# Patient Record
Sex: Female | Born: 1955
Health system: Southern US, Community
[De-identification: ages and names within clinical notes are randomized; demographics above are authoritative.]

## PROBLEM LIST (undated history)

## (undated) DIAGNOSIS — H04123 Dry eye syndrome of bilateral lacrimal glands: Secondary | ICD-10-CM

## (undated) DIAGNOSIS — F329 Major depressive disorder, single episode, unspecified: Secondary | ICD-10-CM

## (undated) DIAGNOSIS — F32A Depression, unspecified: Secondary | ICD-10-CM

## (undated) HISTORY — PX: LASIK: SHX215

## (undated) HISTORY — PX: FRACTURE SURGERY: SHX138

## (undated) HISTORY — PX: COLONOSCOPY: SHX174

## (undated) HISTORY — DX: Major depressive disorder, single episode, unspecified: F32.9

## (undated) HISTORY — DX: Dry eye syndrome of bilateral lacrimal glands: H04.123

## (undated) HISTORY — PX: EYE SURGERY: SHX253

## (undated) HISTORY — DX: Depression, unspecified: F32.A

---

## 2000-02-24 ENCOUNTER — Ambulatory Visit (HOSPITAL_COMMUNITY): Admission: AD | Admit: 2000-02-24 | Discharge: 2000-02-24 | Payer: Self-pay | Admitting: *Deleted

## 2000-02-24 ENCOUNTER — Encounter: Payer: Self-pay | Admitting: *Deleted

## 2000-04-02 HISTORY — PX: CLOSED REDUCTION HAND FRACTURE: SHX973

## 2005-08-30 ENCOUNTER — Encounter: Payer: Self-pay | Admitting: Family Medicine

## 2005-11-14 ENCOUNTER — Encounter: Payer: Self-pay | Admitting: Family Medicine

## 2009-02-08 ENCOUNTER — Ambulatory Visit: Payer: Self-pay | Admitting: Family Medicine

## 2009-02-08 DIAGNOSIS — R51 Headache: Secondary | ICD-10-CM | POA: Insufficient documentation

## 2009-02-08 DIAGNOSIS — R519 Headache, unspecified: Secondary | ICD-10-CM | POA: Insufficient documentation

## 2009-02-08 DIAGNOSIS — R635 Abnormal weight gain: Secondary | ICD-10-CM | POA: Insufficient documentation

## 2009-02-09 ENCOUNTER — Encounter: Payer: Self-pay | Admitting: Family Medicine

## 2009-02-09 LAB — CONVERTED CEMR LAB
ALT: 20 units/L (ref 0–35)
AST: 26 units/L (ref 0–37)
Albumin: 3.8 g/dL (ref 3.5–5.2)
Alkaline Phosphatase: 55 units/L (ref 39–117)
BUN: 11 mg/dL (ref 6–23)
Bilirubin, Direct: 0.1 mg/dL (ref 0.0–0.3)
CO2: 28 meq/L (ref 19–32)
Calcium: 9.3 mg/dL (ref 8.4–10.5)
Chloride: 106 meq/L (ref 96–112)
Cholesterol: 218 mg/dL — ABNORMAL HIGH (ref 0–200)
Creatinine, Ser: 0.8 mg/dL (ref 0.4–1.2)
Direct LDL: 134.4 mg/dL
GFR calc non Af Amer: 80.26 mL/min (ref >60)
Glucose, Bld: 84 mg/dL (ref 70–99)
HDL: 64.6 mg/dL (ref >39.00)
Potassium: 4.4 meq/L (ref 3.5–5.1)
Sodium: 142 meq/L (ref 135–145)
TSH: 2.39 microintl units/mL (ref 0.35–5.50)
Total Bilirubin: 1.1 mg/dL (ref 0.3–1.2)
Total CHOL/HDL Ratio: 3
Total Protein: 7.1 g/dL (ref 6.0–8.3)
Triglycerides: 90 mg/dL (ref 0.0–149.0)
VLDL: 18 mg/dL (ref 0.0–40.0)

## 2009-02-11 ENCOUNTER — Other Ambulatory Visit: Admission: RE | Admit: 2009-02-11 | Discharge: 2009-02-11 | Payer: Self-pay | Admitting: Family Medicine

## 2009-02-11 ENCOUNTER — Ambulatory Visit: Payer: Self-pay | Admitting: Family Medicine

## 2009-02-11 ENCOUNTER — Encounter: Payer: Self-pay | Admitting: Family Medicine

## 2009-02-11 DIAGNOSIS — K921 Melena: Secondary | ICD-10-CM | POA: Insufficient documentation

## 2009-02-16 ENCOUNTER — Encounter (INDEPENDENT_AMBULATORY_CARE_PROVIDER_SITE_OTHER): Payer: Self-pay | Admitting: *Deleted

## 2009-05-20 ENCOUNTER — Telehealth: Payer: Self-pay | Admitting: Family Medicine

## 2009-05-26 ENCOUNTER — Ambulatory Visit: Payer: Self-pay | Admitting: Family Medicine

## 2009-05-26 DIAGNOSIS — J018 Other acute sinusitis: Secondary | ICD-10-CM | POA: Insufficient documentation

## 2009-10-20 ENCOUNTER — Ambulatory Visit: Payer: Self-pay | Admitting: Family Medicine

## 2009-10-20 DIAGNOSIS — F430 Acute stress reaction: Secondary | ICD-10-CM | POA: Insufficient documentation

## 2009-11-15 ENCOUNTER — Encounter: Payer: Self-pay | Admitting: Family Medicine

## 2010-01-12 ENCOUNTER — Telehealth: Payer: Self-pay | Admitting: Family Medicine

## 2010-01-12 ENCOUNTER — Ambulatory Visit: Payer: Self-pay | Admitting: Family Medicine

## 2010-02-01 ENCOUNTER — Telehealth: Payer: Self-pay | Admitting: Family Medicine

## 2010-04-14 ENCOUNTER — Telehealth: Payer: Self-pay | Admitting: Family Medicine

## 2010-05-02 NOTE — Assessment & Plan Note (Signed)
Summary: DISCUSS MEDS FOR DEPRESSION   Vital Signs:  Patient profile:   55 year old female Weight:      177.38 pounds Temp:     98.2 degrees F oral Pulse rate:   60 / minute Pulse rhythm:   regular BP sitting:   110 / 80  (right arm) Cuff size:   regular  Vitals Entered By: Janee Morn CMA (October 20, 2009 8:56 AM) CC: Discuss meds   History of Present Illness: 55 yo here to discuss depression.  Never had a h/o depression in past but has multiple events in her life recently. Lost job last year. Father died in Sep 03, 2009 after prolonged illness. She was in a horse riding accident with her horse in 08/2009 and he had to be put to sleep.  It was very traumatic and the horse was bloodied and in significant pain. They had to take the horse to the dump because they were not at home when it happened.  She continues to replay this even in her mind.  Not having difficulty falling asleep. Appetite has increased and has significiant anhedonia. No SI or HI. Does have crying spells.  Seeing a therapist in [place] who works with PTSD.  Current Medications (verified): 1)  Treximet 85-500 Mg  Tabs (Sumatriptan-Naproxen Sodium) .[name].. 1 Once Daily As Needed Migraine 2)  Veramyst 27.5 Mcg/spray Susp (Fluticasone Furoate) .... As Needed 3)  Anusol-Hc 25 Mg Supp (Hydrocortisone Acetate) .... Apply To Rectum As Needed 4)  Budeprion Xl 150 Mg  Tb24 (Bupropion Hcl) .[name].. 1 By Mouth Every Morning.  Allergies (verified): No Known Drug Allergies  Review of Systems      See HPI Psych:  Complains of depression and easily tearful; denies anxiety, mental problems, panic attacks, sense of great danger, suicidal thoughts/plans, thoughts of violence, unusual visions or sounds, and thoughts /plans of harming others.  Physical Exam  General:  Well-developed,well-nourished,in no acute distress; alert,appropriate and cooperative throughout examination  Psych:  normally interactive, good eye  contact,tearful.   Impression & Recommendations:  Problem # 1:  STRESS REACTION, ACUTE (ICD-308.9) Assessment New Time spent with patient 25 minutes, more than 50% of this time was spent counseling patient on depression. Advised continuing therapy, will add wellbutrin. Pt to follow up in one month.  Complete Medication List: 1)  Treximet 85-500 Mg Tabs (Sumatriptan-naproxen sodium) .[name].. 1 once daily as needed migraine 2)  Veramyst 27.5 Mcg/spray Susp (Fluticasone furoate) .... As needed 3)  Anusol-hc 25 Mg Supp (Hydrocortisone acetate) .... Apply to rectum as needed 4)  Budeprion Xl 150 Mg Tb24 (Bupropion hcl) .[name].. 1 by mouth every morning.  Patient Instructions: 1)  Please call me in 2-4 weeks to let me know how you are doing. Prescriptions: BUDEPRION XL 150 MG  TB24 (BUPROPION HCL) 1 by mouth every morning.  #90 x 0   Entered and Authorized by:   Ruthe Mannan MD   Signed by:   Ruthe Mannan MD on 10/20/2009   Method used:   Electronically to        [facility] [address] (retail)       [address]       [place], [place]  [address]       Ph: [phone]       Fax: [phone]   RxID:   [id number]   Current Allergies (reviewed today): No known allergies

## 2010-05-02 NOTE — Assessment & Plan Note (Signed)
Summary: FLU SHOT/CLE  Nurse Visit   Allergies: No Known Drug Allergies  Immunizations Administered:  Influenza Vaccine # 1:    Vaccine Type: Fluvax 3+    Site: left deltoid    Mfr: GlaxoSmithKline    Dose: 0.5 ml    Route: IM    Given by: Mervin Hack CMA (AAMA)    Exp. Date: 09/30/2010    Lot #: [id number]    VIS given: 10/25/09 version given January 12, 2010.  Flu Vaccine Consent Questions:    Do you have a history of severe allergic reactions to this vaccine? no    Any prior history of allergic reactions to egg and/or gelatin? no    Do you have a sensitivity to the preservative Thimersol? no    Do you have a past history of Guillan-Barre Syndrome? no    Do you currently have an acute febrile illness? no    Have you ever had a severe reaction to latex? no    Vaccine information given and explained to patient? yes    Are you currently pregnant? no  Orders Added: 1)  Flu Vaccine 9yrs + [90658] 2)  Admin 1st Vaccine [09811]

## 2010-05-02 NOTE — Progress Notes (Signed)
Summary: refill request for treximet  Phone Note Refill Request Message from:  Fax from Pharmacy  Refills Requested: Medication #1:  TREXIMET 85-500 MG  TABS 1 once daily as needed migraine   Last Refilled: 01/12/2010 Faxed request from [place], [phone].  Initial call taken by: Lowella Petties CMA, AAMA,  February 01, 2010 10:20 AM    Prescriptions: TREXIMET 85-500 MG  TABS (SUMATRIPTAN-NAPROXEN SODIUM) 1 once daily as needed migraine  #10 x 1   Entered and Authorized by:   Ruthe Mannan MD   Signed by:   Ruthe Mannan MD on 02/01/2010   Method used:   Electronically to        [facility] [address] (retail)       [address]       [place], [place]  [address]       Ph: [phone]       Fax: [phone]   RxID:   [id number]

## 2010-05-02 NOTE — Progress Notes (Signed)
Summary: Cold, cough  Phone Note Call from Patient Call back at Work Phone [phone]   Caller: Patient Call For: Ruthe Mannan MD Summary of Call: Patient states she has a cold x four days, no fever, sore throat, body aches, chills, fatigue.  Patient has been taking Dayquil but not much improvement.  Advised patient that we have no opening at all, gave her the option to go to the [place] office to be seen and she refused.  Would like cough syrup called in if possible.  Please advise.  Uses [facility] [address]  Initial call taken by: Linde Gillis CMA Duncan Dull),  May 20, 2009 9:46 AM  Follow-up for Phone Call        Agree with above.  Can call Tessalon Perles in otherwise needs to be seen. Follow-up by: Ruthe Mannan MD,  May 20, 2009 9:49 AM    New/Updated Medications: TESSALON PERLES 100 MG  CAPS (BENZONATATE) 1 tab by mouth three times a day as needed cough. Prescriptions: TESSALON PERLES 100 MG  CAPS (BENZONATATE) 1 tab by mouth three times a day as needed cough.  #30 x 0   Entered and Authorized by:   Ruthe Mannan MD   Signed by:   Ruthe Mannan MD on 05/20/2009   Method used:   Electronically to        [facility] [address] (retail)       [address]       [place], [place]  [address]       Ph: [phone]       Fax: [phone]   RxID:   [id number]   Appended Document: Cold, cough Could not get through on work phone number.  Patient Advised.

## 2010-05-02 NOTE — Progress Notes (Signed)
Summary: BUDEPRION XL 150 MG   Phone Note Call from Patient Call back at Home Phone [phone]   Caller: Patient Call For: Veronica Mannan MD Summary of Call: FYI   Pt came in today for flu shot and wanted to let you know that she's doing fine on the BUDEPRION XL 150 MG , she will need a refill in about a week, this should be sent to [facility] in [place]. I advised the pt to call the pharmacy and they will contact us. Initial call taken by: Mervin Hack CMA Duncan Dull),  January 12, 2010 10:08 AM    Prescriptions: BUDEPRION XL 150 MG  TB24 (BUPROPION HCL) 1 by mouth every morning.  #90 x 0   Entered and Authorized by:   Veronica Mannan MD   Signed by:   Veronica Mannan MD on 01/12/2010   Method used:   Electronically to        [facility] [address] (retail)       [address]       [place], [place]  [address]       Ph: [phone]       Fax: [phone]   RxID:   [id number]

## 2010-05-02 NOTE — Assessment & Plan Note (Signed)
Summary: COUGH,COLD/CLE   Vital Signs:  Patient profile:   55 year old female Height:      62 inches Weight:      182.38 pounds BMI:     33.48 Temp:     98.2 degrees F oral Pulse rate:   84 / minute Pulse rhythm:   regular BP sitting:   124 / 80  (left arm) Cuff size:   regular  Vitals Entered By: Delilah Shan CMA Duncan Dull) (May 26, 2009 11:02 AM) CC: Cough, cold   History of Present Illness: 55 yo with 9 days of URI symptoms. Started with runny nose, did have body aches and sujbecitive fevers a few days ago. Now has dry hacking cough and sinus pressure. Taking Tessalon Perles and Mucinex with no relief of symptoms. No n/v/d. No shortness of breath or wheezing.  Current Medications (verified): 1)  Treximet 85-500 Mg  Tabs (Sumatriptan-Naproxen Sodium) .[name].. 1 Once Daily As Needed Migraine 2)  Veramyst 27.5 Mcg/spray Susp (Fluticasone Furoate) .... As Needed 3)  Anusol-Hc 25 Mg Supp (Hydrocortisone Acetate) .... Apply To Rectum As Needed 4)  Tessalon Perles 100 Mg  Caps (Benzonatate) .[name].. 1 Tab By Mouth Three Times A Day As Needed Cough. 5)  Augmentin 875-125 Mg Tabs (Amoxicillin-Pot Clavulanate) .[name].. 1 By Mouth 2 Times Daily X 10 Days 6)  Cheratussin Ac 100-10 Mg/23ml Syrp (Guaifenesin-Codeine) .... 5 Ml Po At Bedtime As Needed Cough.  Allergies (verified): No Known Drug Allergies  Review of Systems      See HPI General:  Complains of chills and fever. ENT:  Complains of sinus pressure and sore throat; denies earache. Resp:  Complains of cough; denies shortness of breath, sputum productive, and wheezing.  Physical Exam  General:  Well-developed,well-nourished,in no acute distress; alert,appropriate and cooperative throughout examination non toxic. Ears:  TMs retracted bilaterally. Nose:  nasal dischargemucosal pallor.   neg sinuses. Mouth:  pharyngeal erythema.   Lungs:  Normal respiratory effort, chest expands symmetrically. Lungs are clear to auscultation, no  crackles or wheezes. Heart:  Normal rate and regular rhythm. S1 and S2 normal without gallop, murmur, click, rub or other extra sounds. Extremities:  No clubbing, cyanosis, edema, or deformity noted with normal full range of motion of all joints.   Psych:  normally interactive, good eye contact, not anxious appearing, and not depressed appearing.     Impression & Recommendations:  Problem # 1:  OTHER ACUTE SINUSITIS (ICD-461.8) Assessment New Give duration and progression of symptoms, will treat with Augmentin. Continue supportive care with Mucinex and Cheratussin. Her updated medication list for this problem includes:    Veramyst 27.5 Mcg/spray Susp (Fluticasone furoate) .[name]... As needed    Tessalon Perles 100 Mg Caps (Benzonatate) .[name]... 1 tab by mouth three times a day as needed cough.    Augmentin 875-125 Mg Tabs (Amoxicillin-pot clavulanate) .[name]... 1 by mouth 2 times daily x 10 days    Cheratussin Ac 100-10 Mg/63ml Syrp (Guaifenesin-codeine) .Marland KitchenMarland KitchenMarland KitchenMarland Kitchen 5 ml po at bedtime as needed cough.  Complete Medication List: 1)  Treximet 85-500 Mg Tabs (Sumatriptan-naproxen sodium) .[name].. 1 once daily as needed migraine 2)  Veramyst 27.5 Mcg/spray Susp (Fluticasone furoate) .... As needed 3)  Anusol-hc 25 Mg Supp (Hydrocortisone acetate) .... Apply to rectum as needed 4)  Tessalon Perles 100 Mg Caps (Benzonatate) .[name].. 1 tab by mouth three times a day as needed cough. 5)  Augmentin 875-125 Mg Tabs (Amoxicillin-pot clavulanate) .[name].. 1 by mouth 2 times daily x 10 days 6)  Cheratussin Ac 100-10 Mg/52ml Syrp (Guaifenesin-codeine) .... 5 ml po at bedtime as needed cough. Prescriptions: CHERATUSSIN AC 100-10 MG/5ML SYRP (GUAIFENESIN-CODEINE) 5 ml po at bedtime as needed cough.  #4 ounces x 0   Entered and Authorized by:   Ruthe Mannan MD   Signed by:   Ruthe Mannan MD on 05/26/2009   Method used:   Print then Give to Patient   RxID:   [id number] AUGMENTIN 875-125 MG TABS (AMOXICILLIN-POT CLAVULANATE) 1 by  mouth 2 times daily x 10 days  #20 x 0   Entered and Authorized by:   Ruthe Mannan MD   Signed by:   Ruthe Mannan MD on 05/26/2009   Method used:   Electronically to        [facility] [address] (retail)       [address]       [place], [place]  [address]       Ph: [phone]       Fax: [phone]   RxID:   [id number]   Current Allergies (reviewed today): No known allergies

## 2010-05-04 NOTE — Progress Notes (Signed)
Summary: refill request for anusol  Phone Note Refill Request Message from:  Fax from Pharmacy  Refills Requested: Medication #1:  ANUSOL-HC 25 MG SUPP apply to rectum as needed   Last Refilled: 02/11/2009 Faxed request from [facility]  Initial call taken by: Lowella Petties CMA, AAMA,  April 14, 2010 8:34 AM    Prescriptions: ANUSOL-HC 25 MG SUPP (HYDROCORTISONE ACETATE) apply to rectum as needed  #15 grams x 1   Entered and Authorized by:   Ruthe Mannan MD   Signed by:   Ruthe Mannan MD on 04/14/2010   Method used:   Electronically to        [facility] [address] (retail)       [address]       [place], [place]  [address]       Ph: [phone]       Fax: [phone]   RxID:   [id number]

## 2010-08-18 NOTE — Op Note (Signed)
[address]. [facility]  Patient:    Veronica [name], Veronica [name]                           MRN: [id number] Proc. Date: 02/24/00 Adm. Date:  [id number] Attending:  Kendell Bane CC:         Claude Manges. Cleophas Dunker, M.D.  Lunette Stands, M.D.   Operative Report  PREOPERATIVE DIAGNOSIS:  Closed, oblique fracture - left fifth metacarpal neck.  POSTOPERATIVE DIAGNOSIS:  Closed, oblique fracture - left fifth metacarpal neck.  PROCEDURE:  Open reduction/internal fixation - left fifth metacarpal fracture.  SURGEON:  Lowell Bouton, M.D.  ANESTHESIA:  General.  OPERATIVE FINDINGS:  The patient had an oblique fracture just proximal to the articular surface of the left fifth metacarpal.  It was 100% displaced radially.  PROCEDURE:  Under general anesthesia with a tourniquet on the left arm, the left hand was prepped and draped in usual fashion and after exsanguinating the limb, the tourniquet was inflated to 225 mmHg.  A longitudinal incision was made over the dorsum of the fifth metacarpal.  Blunt dissection was carried to the subcutaneous tissues down to the extensor tendon.  The extensor tendon was split longitudinally in the midline and sharp dissection was carried down to the metacarpal.  The periosteum was elevated at the fracture site and the fracture site was cleaned of any debris.  The fracture site was irrigated with saline and the fracture was then reduced and held with a bone reduction clamp.  A 45 K-wire was then placed from the fourth web space through the neck, across the fracture site and a second 45 K-wire was placed from the ulnar side of the hand from proximal to distal.  Care was taken to avoid the extensor mechanism with the pins.  The pins were bent over and left protruding from the skin.  X-rays showed good alignment of the fracture.  There was no rotational deformity clinically after repairing the fracture.  The articular surface appeared  smooth with no defects.  After irrigating the wound, the periosteum was closed with a 4-0 Vicryl.  The extensor mechanism was repaired with 4-0 Vicryl.  The skin and subcutaneous tissue was closed with 4-0 Vicryl.  The skin was closed with a 3-0 subcuticular Prolene, Steri-Strips were applied and 0.5% Marcaine was inserted in the wound for pain control.  The patient tolerated the procedure well.  She was placed in a volar protective splint. She went to the recovery room awake and stable in good condition. DD:  02/24/00 TD:  02/24/00 Job: [id number]

## 2010-11-22 LAB — HM PAP SMEAR: HM Pap smear: NEGATIVE

## 2010-11-27 ENCOUNTER — Encounter: Payer: Self-pay | Admitting: Family Medicine

## 2010-11-30 ENCOUNTER — Other Ambulatory Visit: Payer: Self-pay | Admitting: *Deleted

## 2010-11-30 MED ORDER — BUPROPION HCL ER (XL) 150 MG PO TB24: 150.0000 mg | ORAL_TABLET | ORAL | Status: DC

## 2010-11-30 NOTE — Telephone Encounter (Signed)
Last refill 05/01/2010.

## 2012-01-16 ENCOUNTER — Ambulatory Visit (INDEPENDENT_AMBULATORY_CARE_PROVIDER_SITE_OTHER): Payer: 59

## 2012-01-16 DIAGNOSIS — Z23 Encounter for immunization: Secondary | ICD-10-CM

## 2012-04-08 ENCOUNTER — Telehealth: Payer: Self-pay

## 2012-04-08 NOTE — Telephone Encounter (Signed)
Pt returning Laurie's call; pt request call back.

## 2012-04-08 NOTE — Telephone Encounter (Signed)
Advised patient she has not been seen in almost 3 years, needs refill on wellbutrin.  Appointment scheduled for this Friday.

## 2012-04-11 ENCOUNTER — Other Ambulatory Visit (HOSPITAL_COMMUNITY)
Admission: RE | Admit: 2012-04-11 | Discharge: 2012-04-11 | Disposition: A | Discharge status: Home / Self Care | Payer: 59 | Source: Ambulatory Visit | Attending: Family Medicine | Admitting: Family Medicine

## 2012-04-11 ENCOUNTER — Encounter: Payer: Self-pay | Admitting: Family Medicine

## 2012-04-11 ENCOUNTER — Ambulatory Visit (INDEPENDENT_AMBULATORY_CARE_PROVIDER_SITE_OTHER): Payer: 59 | Admitting: Family Medicine

## 2012-04-11 ENCOUNTER — Encounter: Payer: Self-pay | Admitting: Internal Medicine

## 2012-04-11 VITALS — BP 130/84 | HR 64 | Temp 98.0°F | Ht 62.5 in | Wt 152.0 lb

## 2012-04-11 DIAGNOSIS — Z01419 Encounter for gynecological examination (general) (routine) without abnormal findings: Secondary | ICD-10-CM | POA: Insufficient documentation

## 2012-04-11 DIAGNOSIS — Z113 Encounter for screening for infections with a predominantly sexual mode of transmission: Secondary | ICD-10-CM

## 2012-04-11 DIAGNOSIS — Z1211 Encounter for screening for malignant neoplasm of colon: Secondary | ICD-10-CM

## 2012-04-11 DIAGNOSIS — F419 Anxiety disorder, unspecified: Secondary | ICD-10-CM

## 2012-04-11 DIAGNOSIS — F341 Dysthymic disorder: Secondary | ICD-10-CM

## 2012-04-11 DIAGNOSIS — N952 Postmenopausal atrophic vaginitis: Secondary | ICD-10-CM | POA: Insufficient documentation

## 2012-04-11 DIAGNOSIS — F32A Depression, unspecified: Secondary | ICD-10-CM | POA: Insufficient documentation

## 2012-04-11 DIAGNOSIS — Z0000 Encounter for general adult medical examination without abnormal findings: Secondary | ICD-10-CM

## 2012-04-11 DIAGNOSIS — Z136 Encounter for screening for cardiovascular disorders: Secondary | ICD-10-CM

## 2012-04-11 DIAGNOSIS — Z1151 Encounter for screening for human papillomavirus (HPV): Secondary | ICD-10-CM | POA: Insufficient documentation

## 2012-04-11 LAB — COMPREHENSIVE METABOLIC PANEL
AST: 27 U/L (ref 0–37)
Albumin: 4.6 g/dL (ref 3.5–5.2)
Alkaline Phosphatase: 49 U/L (ref 39–117)
Potassium: 4.6 mEq/L (ref 3.5–5.3)
Sodium: 140 mEq/L (ref 135–145)
Total Protein: 6.9 g/dL (ref 6.0–8.3)

## 2012-04-11 LAB — LIPID PANEL: LDL Cholesterol: 145 mg/dL — ABNORMAL HIGH (ref 0–99)

## 2012-04-11 MED ORDER — CONJ ESTROG-MEDROXYPROGEST ACE 0.625-5 MG PO TABS: 1.0000 | ORAL_TABLET | Freq: Every day | ORAL | Status: DC

## 2012-04-11 MED ORDER — BUPROPION HCL ER (XL) 150 MG PO TB24: 150.0000 mg | ORAL_TABLET | ORAL | Status: DC

## 2012-04-11 NOTE — Patient Instructions (Addendum)
Good to see you. Please stop by to see Shirlee Limerick on your way out. We will call you with your lab work.  Please call to give me an update about your symptoms and how you are doing on the hormone replacement therapy.

## 2012-04-11 NOTE — Progress Notes (Signed)
56 here for CPX.  Has not been seen here since 09/2009.  Last pap smear was in 2010.  No h/o abnormal pap smears. Denies any dysuria or vaginal discharge.  Doing very well- has lost 30 pounds with diet and exercise. Wt Readings from Last 3 Encounters:  04/11/12 152 lb (68.947 kg)  10/20/09 177 lb 6.1 oz (80.46 kg)  05/26/09 182 lb 6.1 oz (82.728 kg)    UTD on all other prevention other than colon cancer screening- has never had a colonoscopy.  Denies any family h/o colon cancer. No changes in her bowel habits.    Depression has been stable on Wellbutrin.  Denies any anxiety or depression.  She changed jobs and now works for a [employer] development [employer]- loves working from home.   Vaginal dryness- went through early menopause.  She has been having painful intercourse and vaginal dryness.  OTC lubricants not helpful.   Patient Active Problem List  Diagnosis  . Routine general medical examination at a health care facility  . Anxiety and depression   No past medical history on file. No past surgical history on file. History  Substance Use Topics  . Smoking status: Not on file  . Smokeless tobacco: Not on file  . Alcohol Use: Not on file   No family history on file. Allergies not on file Current Outpatient Prescriptions on File Prior to Visit  Medication Sig Dispense Refill  . buPROPion (WELLBUTRIN XL) 150 MG 24 hr tablet Take 1 tablet (150 mg total) by mouth every morning.  90 tablet  3   The PMH, PSH, Social History, Family History, Medications, and allergies have been reviewed in [facility], and have been updated if relevant.  ROS: See HPI  Physical exam: BP 130/84  Pulse 64  Temp 98 F (36.7 C)  Ht 5' 2.5" (1.588 m)  Wt 152 lb (68.947 kg)  BMI 27.36 kg/m2  General:  Well-developed,well-nourished,in no acute distress; alert,appropriate and cooperative throughout examination Head:  normocephalic and atraumatic.   Eyes:  vision grossly intact, pupils equal, pupils round, and pupils  reactive to light.   Ears:  R ear normal and L ear normal.   Nose:  no external deformity.   Mouth:  good dentition.   Neck:  No deformities, masses, or tenderness noted. Breasts:  No mass, nodules, thickening, tenderness, bulging, retraction, inflamation, nipple discharge or skin changes noted.   Lungs:  Normal respiratory effort, chest expands symmetrically. Lungs are clear to auscultation, no crackles or wheezes. Heart:  Normal rate and regular rhythm. S1 and S2 normal without gallop, murmur, click, rub or other extra sounds. Abdomen:  Bowel sounds positive,abdomen soft and non-tender without masses, organomegaly or hernias noted. Rectal:  no external abnormalities.   Genitalia:  Pelvic Exam:        External: normal female genitalia without lesions or masses        Vagina: normal without lesions or masses        Cervix: normal without lesions or masses        Adnexa: normal bimanual exam without masses or fullness        Uterus: normal by palpation        Pap smear: performed Msk:  No deformity or scoliosis noted of thoracic or lumbar spine.   Extremities:  No clubbing, cyanosis, edema, or deformity noted with normal full range of motion of all joints.   Neurologic:  alert & oriented X3 and gait normal.   Skin:  Intact without suspicious  lesions or rashes Cervical Nodes:  No lymphadenopathy noted Axillary Nodes:  No palpable lymphadenopathy Psych:  Cognition and judgment appear intact. Alert and cooperative with normal attention span and concentration. No apparent delusions, illusions, hallucinations  Assessment and Plan: 1. Routine general medical examination at a health care facility  Reviewed preventive care protocols, scheduled due services, and updated immunizations Discussed nutrition, exercise, diet, and healthy lifestyle.  Comprehensive metabolic panel, Cytology - PAP  2. Anxiety and depression  Well controlled on Wellbutrin at current dose. Rx refilled.   3. Screening for  colon cancer  Ambulatory referral to Gastroenterology  4. Screening for ischemic heart disease  Lipid Panel  5. Vaginal atrophy   Discussed risks and benefits of HRT.  Pt has a uterus and needs progesterone with estrogen to prevent endometrial hyperplasia (20-50% of patients on unopposed estrogen for 1 yr will develop hyperplasia). Start Prempro daily.

## 2012-04-14 ENCOUNTER — Encounter: Payer: Self-pay | Admitting: *Deleted

## 2012-04-17 ENCOUNTER — Encounter: Payer: Self-pay | Admitting: *Deleted

## 2012-04-17 ENCOUNTER — Encounter: Payer: Self-pay | Admitting: Family Medicine

## 2012-04-17 LAB — HM PAP SMEAR: HM Pap smear: NORMAL

## 2012-04-29 ENCOUNTER — Encounter: Payer: Self-pay | Admitting: Internal Medicine

## 2012-05-29 ENCOUNTER — Encounter: Payer: 59 | Admitting: Internal Medicine

## 2012-06-11 ENCOUNTER — Ambulatory Visit (AMBULATORY_SURGERY_CENTER): Payer: 59 | Admitting: *Deleted

## 2012-06-11 VITALS — Ht 62.0 in | Wt 154.2 lb

## 2012-06-11 DIAGNOSIS — Z1211 Encounter for screening for malignant neoplasm of colon: Secondary | ICD-10-CM

## 2012-06-11 MED ORDER — NA SULFATE-K SULFATE-MG SULF 17.5-3.13-1.6 GM/177ML PO SOLN: ORAL | Status: DC

## 2012-06-12 ENCOUNTER — Encounter: Payer: Self-pay | Admitting: Internal Medicine

## 2012-06-18 ENCOUNTER — Encounter: Payer: 59 | Admitting: Internal Medicine

## 2012-07-02 ENCOUNTER — Encounter: Payer: Self-pay | Admitting: Internal Medicine

## 2012-07-02 ENCOUNTER — Ambulatory Visit (AMBULATORY_SURGERY_CENTER): Payer: 59 | Admitting: Internal Medicine

## 2012-07-02 VITALS — BP 154/85 | HR 58 | Temp 97.5°F | Resp 17 | Ht 62.0 in | Wt 154.0 lb

## 2012-07-02 DIAGNOSIS — Z1211 Encounter for screening for malignant neoplasm of colon: Secondary | ICD-10-CM

## 2012-07-02 MED ORDER — SODIUM CHLORIDE 0.9 % IV SOLN: 500.0000 mL | INTRAVENOUS | Status: DC

## 2012-07-02 NOTE — Op Note (Signed)
[facility] [address] [place], [address]   COLONOSCOPY PROCEDURE REPORT  PATIENT: Veronica [name], Veronica [name]  MR#: [id number] BIRTHDATE: 1955-10-11 , 56  yrs. old GENDER: Female ENDOSCOPIST: Iva Boop, MD, [facility] REFERRED XB:JYNWG Aron, M.D. PROCEDURE DATE:  07/02/2012 PROCEDURE:   Colonoscopy, screening ASA CLASS:   Class I INDICATIONS:average risk screening. MEDICATIONS: propofol (Diprivan) 200mg  IV  DESCRIPTION OF PROCEDURE:   After the risks benefits and alternatives of the procedure were thoroughly explained, informed consent was obtained.  A digital rectal exam revealed no abnormalities of the rectum.   The LB [id number]  endoscope was introduced through the anus and advanced to the cecum, which was identified by both the appendix and ileocecal valve. No adverse events experienced.   The quality of the prep was Suprep excellent The instrument was then slowly withdrawn as the colon was fully examined.      COLON FINDINGS: A normal appearing cecum, ileocecal valve, and appendiceal orifice were identified.  The ascending, hepatic flexure, transverse, splenic flexure, descending, sigmoid colon and rectum appeared unremarkable.  No polyps or cancers were seen. Retroflexed views revealed no abnormalities. The time to cecum=2 minutes 22 seconds.  Withdrawal time=8 minutes 00 seconds.  The scope was withdrawn and the procedure completed. COMPLICATIONS: There were no complications.  ENDOSCOPIC IMPRESSION: Normal colonoscopy - excellent prep  RECOMMENDATIONS: Repeat colonoscopy 10 years.   eSigned:  Iva Boop, MD, [facility] 07/02/2012 8:54 AM   cc: Ruthe Mannan MD and The Patient

## 2012-07-02 NOTE — Patient Instructions (Addendum)
The colonoscopy was normal and the prep was great!  Next routine colonoscopy in 10 years - 2024.  Thank you for choosing me and [facility].  Iva Boop, MD, FACG YOU HAD AN ENDOSCOPIC PROCEDURE TODAY AT THE [facility]: Refer to the procedure report that was given to you for any specific questions about what was found during the examination.  If the procedure report does not answer your questions, please call your gastroenterologist to clarify.  If you requested that your care partner not be given the details of your procedure findings, then the procedure report has been included in a sealed envelope for you to review at your convenience later.  YOU SHOULD EXPECT: Some feelings of bloating in the abdomen. Passage of more gas than usual.  Walking can help get rid of the air that was put into your GI tract during the procedure and reduce the bloating. If you had a lower endoscopy (such as a colonoscopy or flexible sigmoidoscopy) you may notice spotting of blood in your stool or on the toilet paper. If you underwent a bowel prep for your procedure, then you may not have a normal bowel movement for a few days.  DIET: Your first meal following the procedure should be a light meal and then it is ok to progress to your normal diet.  A half-sandwich or bowl of soup is an example of a good first meal.  Heavy or fried foods are harder to digest and may make you feel nauseous or bloated.  Likewise meals heavy in dairy and vegetables can cause extra gas to form and this can also increase the bloating.  Drink plenty of fluids but you should avoid alcoholic beverages for 24 hours.  ACTIVITY: Your care partner should take you home directly after the procedure.  You should plan to take it easy, moving slowly for the rest of the day.  You can resume normal activity the day after the procedure however you should NOT DRIVE or use heavy machinery for 24 hours (because of the sedation  medicines used during the test).    SYMPTOMS TO REPORT IMMEDIATELY: A gastroenterologist can be reached at any hour.  During normal business hours, 8:30 AM to 5:00 PM Monday through Friday, call [phone].  After hours and on weekends, please call the GI answering service at [phone] who will take a message and have the physician on call contact you.   Following lower endoscopy (colonoscopy or flexible sigmoidoscopy):  Excessive amounts of blood in the stool  Significant tenderness or worsening of abdominal pains  Swelling of the abdomen that is new, acute  Fever of 100F or higher FOLLOW UP: If any biopsies were taken you will be contacted by phone or by letter within the next 1-3 weeks.  Call your gastroenterologist if you have not heard about the biopsies in 3 weeks.  Our staff will call the home number listed on your records the next business day following your procedure to check on you and address any questions or concerns that you may have at that time regarding the information given to you following your procedure. This is a courtesy call and so if there is no answer at the home number and we have not heard from you through the emergency physician on call, we will assume that you have returned to your regular daily activities without incident.  SIGNATURES/CONFIDENTIALITY: You and/or your care partner have signed paperwork which will be entered into your electronic medical  record.  These signatures attest to the fact that that the information above on your After Visit Summary has been reviewed and is understood.  Full responsibility of the confidentiality of this discharge information lies with you and/or your care-partner. 

## 2012-07-02 NOTE — Progress Notes (Signed)
Patient did not have preoperative order for IV antibiotic SSI prophylaxis. (G8918)  Patient did not experience any of the following events: a burn prior to discharge; a fall within the facility; wrong site/side/patient/procedure/implant event; or a hospital transfer or hospital admission upon discharge from the facility. (G8907)  

## 2012-07-02 NOTE — Progress Notes (Signed)
NO EGG OR SOY ALLERGY. EWM 

## 2012-07-02 NOTE — Progress Notes (Signed)
Lidocaine-40mg IV prior to Propofol InductionPropofol given over incremental dosages 

## 2012-07-03 ENCOUNTER — Telehealth: Payer: Self-pay | Admitting: *Deleted

## 2012-07-03 NOTE — Telephone Encounter (Signed)
  Follow up Call-  Call back number 07/02/2012  Post procedure Call Back phone  # [phone]  Permission to leave phone message Yes     Patient questions:  Do you have a fever, pain , or abdominal swelling? no Pain Score  0 *  Have you tolerated food without any problems? yes  Have you been able to return to your normal activities? yes  Do you have any questions about your discharge instructions: Diet   no Medications  no Follow up visit  no  Do you have questions or concerns about your Care? no  Actions: * If pain score is 4 or above: No action needed, pain <4.

## 2012-07-31 ENCOUNTER — Other Ambulatory Visit: Payer: Self-pay | Admitting: Family Medicine

## 2012-11-19 ENCOUNTER — Encounter: Payer: Self-pay | Admitting: Family Medicine

## 2012-12-26 ENCOUNTER — Ambulatory Visit (INDEPENDENT_AMBULATORY_CARE_PROVIDER_SITE_OTHER): Payer: 59

## 2012-12-26 DIAGNOSIS — Z23 Encounter for immunization: Secondary | ICD-10-CM

## 2013-01-26 ENCOUNTER — Other Ambulatory Visit: Payer: Self-pay | Admitting: *Deleted

## 2013-01-26 MED ORDER — CONJ ESTROG-MEDROXYPROGEST ACE 0.625-5 MG PO TABS: ORAL_TABLET | ORAL | Status: DC

## 2013-03-18 ENCOUNTER — Ambulatory Visit (INDEPENDENT_AMBULATORY_CARE_PROVIDER_SITE_OTHER): Payer: 59 | Admitting: Family Medicine

## 2013-03-18 ENCOUNTER — Encounter: Payer: Self-pay | Admitting: Family Medicine

## 2013-03-18 VITALS — BP 116/80 | HR 62 | Temp 97.9°F | Ht 62.5 in | Wt 147.2 lb

## 2013-03-18 DIAGNOSIS — M7500 Adhesive capsulitis of unspecified shoulder: Secondary | ICD-10-CM

## 2013-03-18 DIAGNOSIS — M7502 Adhesive capsulitis of left shoulder: Secondary | ICD-10-CM

## 2013-03-18 NOTE — Progress Notes (Signed)
Pre-visit discussion using our clinic review tool. No additional management support is needed unless otherwise documented below in the visit note.  

## 2013-03-18 NOTE — Progress Notes (Signed)
Patient Name: Veronica [name] Date of Birth: [birth year] Medical Record Number: [id number] Gender: female  PCP: Ruthe Mannan, MD  History of Present Illness:  Veronica [name] is a 57 y.o. very pleasant female patient who presents with the following: shoulder pain  The patient noted above presents with shoulder pain that has been ongoing for months Mostly Left. there is no history of trauma or accident. The patient denies neck pain or radicular symptoms. No shoulder blade pain Denies dislocation, subluxation, separation of the shoulder. The patient does not complain of pain in the overhead plane. Significant restriction of motion.  Strange kind of pain in her arm.  Bilateral arms and reaching and got some pain.  Does not do it every time.  Husband helps to get on clothes.   Medications Tried: tylenol, nsaids Ice or Heat: minimal help Tried PT: No  Prior shoulder Injury: No Prior surgery: No Prior fracture: No  Past Medical History, Surgical History, Social History, Family History, Medications, and allergies reviewed and updated if relevant.   Review of Systems  GEN: No fevers, chills. Nontoxic. Primarily MSK c/o today. MSK: Detailed in the HPI GI: tolerating PO intake without difficulty Neuro: No numbness, parasthesias, or tingling associated. Otherwise the pertinent positives of the ROS are noted above.    Physical Examination  Filed Vitals:   03/18/13 0933  BP: 116/80  Pulse: 62  Temp: 97.9 F (36.6 C)  TempSrc: Oral  Height: 5' 2.5" (1.588 m)  Weight: 147 lb 4 oz (66.792 kg)    GEN: Well-developed,well-nourished,in no acute distress; alert,appropriate and cooperative throughout examination HEENT: Normocephalic and atraumatic without obvious abnormalities. Ears, externally no deformities PULM: Breathing comfortably in no respiratory distress EXT: No clubbing, cyanosis, or edema PSYCH: Normally interactive. Cooperative during the interview. Pleasant. Friendly and  conversant. Not anxious or depressed appearing. Normal, full affect.  Shoulder: b Inspection: No muscle wasting or winging Ecchymosis/edema: neg  AC joint, scapula, clavicle: NT Cervical spine: NT, full ROM Spurling's: neg Abduction: 5/5, limited to 80 deg on L Flexion: 5/5, limited to 80 deg IR, full, lift-off: 5/5, none at 80 deg abd ER at neutral: 5/5, loss of 20 deg compared to contralateral side, in abduction AC crossover and compression: neg Neer: neg Hawkins: neg Drop Test: neg Empty Can: neg Supraspinatus insertion: NT Bicipital groove: NT Speed's: neg Yergason's: neg Sulcus sign: neg Scapular dyskinesis: none C5-T1 intact Sensation intact Grip 5/5  Assessment and Plan:  Adhesive capsulitis Shoulder pain  >25 minutes spent in face to face time with patient, >50% spent in counselling or coordination of care  Patient was given a systematic ROM protocol from [employer] to be done daily.  Tylenol or NSAID of choice prn for pain relief Intraarticular shoulder injections discussed with patient, which have good evidence for accelerating the thawing phase.  Patient will be sent for formal PT for aggressive frozen shoulder ROM. Will need RTC str and scapular stabilization to fix underlying mechanics. F/u 6-8 weeks  Intrarticular Shoulder Injection, LEFT Verbal consent was obtained from the patient. Risks including infection explained and contrasted with benefits and alternatives. Patient prepped with Chloraprep and Ethyl Chloride used for anesthesia. An intraarticular shoulder injection was performed using the posterior approach. The patient tolerated the procedure well and had decreased pain post injection. No complications. Injection: 8 cc of Lidocaine 1% and 2 cc of Depo-Medrol 40 mg. Needle: 22 gauge    Orders Placed This Encounter  Procedures  . Ambulatory referral to Physical Therapy  Referral Priority:  Routine    Referral Type:  Physical Medicine    Referral  Reason:  Specialty Services Required    Requested Specialty:  Physical Therapy    Number of Visits Requested:  1

## 2013-03-18 NOTE — Patient Instructions (Signed)
REFERRAL: GO THE THE FRONT ROOM AT THE ENTRANCE OF OUR CLINIC, NEAR CHECK IN. ASK FOR MARION. SHE WILL HELP YOU SET UP YOUR REFERRAL. DATE: TIME:   F/u 2 months

## 2013-04-01 ENCOUNTER — Telehealth: Payer: Self-pay

## 2013-04-01 NOTE — Telephone Encounter (Signed)
Pt left v/m; pt seen 03/18/13 with lt and rt shoulder problems; pt said lt shoulder was injected because it was the worse of the 2 shoulders. Now pt said rt shoulder is more painful. Pt is going to physical therapy for lt shoulder and pt request physical therapy order expanded to include the right shoulder also. Pt is going to [facility] orthopedics specialist in [place] for physical therapy. Pt request cb on 04/03/13.

## 2013-04-01 NOTE — Telephone Encounter (Signed)
Would you mind calling her and let her know i called them and orders updated to include bilateral.    If right is doing a lot worse, she can always come in next week just for a shoulder injection.

## 2013-04-03 NOTE — Telephone Encounter (Signed)
Message left advising patient.  

## 2013-04-12 ENCOUNTER — Other Ambulatory Visit: Payer: Self-pay | Admitting: Family Medicine

## 2013-04-13 NOTE — Telephone Encounter (Signed)
Pt requesting medication refill. Last ov 03/2013 for an acute visit, with no future appts scheduled. pls advise

## 2013-04-29 ENCOUNTER — Other Ambulatory Visit: Payer: Self-pay | Admitting: Family Medicine

## 2013-04-29 NOTE — Telephone Encounter (Signed)
Pt requesting medication refill. Last ov 04/2012. pls advise

## 2013-05-13 ENCOUNTER — Ambulatory Visit (INDEPENDENT_AMBULATORY_CARE_PROVIDER_SITE_OTHER): Payer: 59 | Admitting: Family Medicine

## 2013-05-13 ENCOUNTER — Encounter: Payer: Self-pay | Admitting: Family Medicine

## 2013-05-13 VITALS — BP 102/64 | HR 53 | Temp 97.7°F | Ht 62.5 in | Wt 147.0 lb

## 2013-05-13 DIAGNOSIS — M7500 Adhesive capsulitis of unspecified shoulder: Secondary | ICD-10-CM

## 2013-05-13 NOTE — Progress Notes (Signed)
Pre-visit discussion using our clinic review tool. No additional management support is needed unless otherwise documented below in the visit note.  

## 2013-05-13 NOTE — Progress Notes (Signed)
See scanned note. Computers down

## 2013-06-02 ENCOUNTER — Other Ambulatory Visit: Payer: Self-pay | Admitting: *Deleted

## 2013-06-02 MED ORDER — CONJ ESTROG-MEDROXYPROGEST ACE 0.625-5 MG PO TABS: ORAL_TABLET | ORAL | Status: DC

## 2013-06-02 NOTE — Telephone Encounter (Signed)
Received faxed refill request from pharmacy. Last physical and pap 04/11/12. Is it okay to refill medication?

## 2013-06-05 ENCOUNTER — Other Ambulatory Visit: Payer: Self-pay | Admitting: *Deleted

## 2013-06-05 MED ORDER — BUPROPION HCL ER (XL) 150 MG PO TB24: ORAL_TABLET | ORAL | Status: DC

## 2013-07-08 ENCOUNTER — Other Ambulatory Visit: Payer: Self-pay | Admitting: Family Medicine

## 2013-07-08 NOTE — Telephone Encounter (Signed)
Pt requesting medication refill. Last ov was 04/2012 with no future appts scheduled. Also, I don't show this medication on her list, neither Hx or present. pls advise

## 2013-07-14 ENCOUNTER — Other Ambulatory Visit: Payer: Self-pay | Admitting: Family Medicine

## 2013-07-15 NOTE — Telephone Encounter (Signed)
Ok to refill 

## 2013-08-14 ENCOUNTER — Encounter: Payer: Self-pay | Admitting: Internal Medicine

## 2013-08-14 ENCOUNTER — Ambulatory Visit (INDEPENDENT_AMBULATORY_CARE_PROVIDER_SITE_OTHER): Payer: 59 | Admitting: Internal Medicine

## 2013-08-14 ENCOUNTER — Ambulatory Visit: Payer: 59 | Admitting: Family Medicine

## 2013-08-14 VITALS — BP 118/70 | HR 69 | Temp 98.1°F | Wt 142.0 lb

## 2013-08-14 DIAGNOSIS — J029 Acute pharyngitis, unspecified: Secondary | ICD-10-CM | POA: Insufficient documentation

## 2013-08-14 NOTE — Assessment & Plan Note (Signed)
Clearly viral Discussed secondary bacterial infection---could consider antibiotic next week if worsens Discussed symptom relief

## 2013-08-14 NOTE — Progress Notes (Signed)
   Subjective:    Patient ID: Veronica [name], female    DOB: 10-03-1955, 58 y.o.   MRN: [id number]  HPI Sore throat for 3 days--mostly left side Muscle aches Feels low in energy  No fever Has felt hot and cold Some cough--dry  Some ear pain--mostly left Head congestion and drainage No SOB  Tried day quil and aleve--- may help some  Current Outpatient Prescriptions on File Prior to Visit  Medication Sig Dispense Refill  . ANUCORT-HC 25 MG suppository INSERT 1 SUPPOSITORY INTO RECTUM AS NEEDED  15 suppository  0  . buPROPion (WELLBUTRIN XL) 150 MG 24 hr tablet TAKE ONE TABLET BY MOUTH EVERY MORNING  90 tablet  2  . estrogen, conjugated,-medroxyprogesterone (PREMPRO) 0.625-5 MG per tablet TAKE ONE TABLET BY MOUTH EVERY DAY  84 tablet  0  . RESTASIS 0.05 % ophthalmic emulsion Place 1 drop into both eyes every 12 (twelve) hours.        No current facility-administered medications on file prior to visit.    No Known Allergies  Past Medical History  Diagnosis Date  . Depression   . Dry eyes     Past Surgical History  Procedure Laterality Date  . Closed reduction hand fracture  2002    left  . Lasik      Family History  Problem Relation Age of Onset  . Colon cancer Neg Hx     History   Social History  . Marital Status: Single    Spouse Name: N/A    Number of Children: N/A  . Years of Education: N/A   Occupational History  . Not on file.   Social History Main Topics  . Smoking status: Former Smoker    Quit date: 06/11/1988  . Smokeless tobacco: Never Used  . Alcohol Use: 3.6 oz/week    6 Glasses of wine per week  . Drug Use: No  . Sexual Activity: Not on file   Other Topics Concern  . Not on file   Social History Narrative  . No narrative on file   Review of Systems No rash Some abdominal discomfort around upper abdomen bliaterally Appetite is down but eating No exposure to kids     Objective:   Physical Exam  Constitutional: She appears  well-developed and well-nourished. No distress.  HENT:  No sinus tenderness Slight pharyngeal injection without exudate TMs normal  Neck: Normal range of motion. Neck supple.  Pulmonary/Chest: Effort normal and breath sounds normal. No respiratory distress. She has no wheezes. She has no rales.  Lymphadenopathy:    She has no cervical adenopathy.  Skin: No rash noted.          Assessment & Plan:

## 2013-08-14 NOTE — Progress Notes (Signed)
Pre visit review using our clinic review tool, if applicable. No additional management support is needed unless otherwise documented below in the visit note. 

## 2013-08-16 ENCOUNTER — Encounter: Payer: Self-pay | Admitting: Internal Medicine

## 2013-08-18 ENCOUNTER — Other Ambulatory Visit: Payer: Self-pay | Admitting: Family Medicine

## 2013-08-18 MED ORDER — AMOXICILLIN-POT CLAVULANATE 875-125 MG PO TABS: 1.0000 | ORAL_TABLET | Freq: Two times a day (BID) | ORAL | Status: AC

## 2013-08-18 NOTE — Telephone Encounter (Signed)
Pt called and wanted to know status of request for medication.  She would like medication called in to  [facility]  in [place].    Also wanted to know if she could get cough medicine for night time sleeping.   Best number to call pt is [phone].

## 2013-08-27 ENCOUNTER — Other Ambulatory Visit: Payer: Self-pay | Admitting: Family Medicine

## 2013-09-23 ENCOUNTER — Encounter: Payer: Self-pay | Admitting: Family Medicine

## 2013-11-24 ENCOUNTER — Other Ambulatory Visit: Payer: Self-pay | Admitting: Family Medicine

## 2013-11-30 ENCOUNTER — Telehealth: Payer: Self-pay | Admitting: *Deleted

## 2013-11-30 NOTE — Telephone Encounter (Signed)
Spoke to pt and informed her that she is needing f/u appt. Last OV with Dr Deborra Medina, Jan 2014 with no future appts scheduled. Pt agrees and appt sched; pt advised Rx will be filled at [facility]

## 2013-11-30 NOTE — Telephone Encounter (Signed)
Patient called stating that she had received a call from her mail order pharmacy [facility] that the refill on her Prempro has been denied. Patient does not understand why and request a call back regarding this.

## 2013-12-04 ENCOUNTER — Encounter: Payer: Self-pay | Admitting: Family Medicine

## 2013-12-04 ENCOUNTER — Ambulatory Visit (INDEPENDENT_AMBULATORY_CARE_PROVIDER_SITE_OTHER): Payer: 59 | Admitting: Family Medicine

## 2013-12-04 VITALS — BP 106/68 | HR 66 | Temp 98.3°F | Ht 62.5 in | Wt 142.5 lb

## 2013-12-04 DIAGNOSIS — Z0000 Encounter for general adult medical examination without abnormal findings: Secondary | ICD-10-CM

## 2013-12-04 DIAGNOSIS — F419 Anxiety disorder, unspecified: Principal | ICD-10-CM

## 2013-12-04 DIAGNOSIS — F32A Depression, unspecified: Secondary | ICD-10-CM

## 2013-12-04 DIAGNOSIS — Z136 Encounter for screening for cardiovascular disorders: Secondary | ICD-10-CM

## 2013-12-04 DIAGNOSIS — W57XXXA Bitten or stung by nonvenomous insect and other nonvenomous arthropods, initial encounter: Secondary | ICD-10-CM | POA: Insufficient documentation

## 2013-12-04 DIAGNOSIS — F329 Major depressive disorder, single episode, unspecified: Secondary | ICD-10-CM

## 2013-12-04 DIAGNOSIS — F341 Dysthymic disorder: Secondary | ICD-10-CM

## 2013-12-04 DIAGNOSIS — N952 Postmenopausal atrophic vaginitis: Secondary | ICD-10-CM

## 2013-12-04 DIAGNOSIS — Z23 Encounter for immunization: Secondary | ICD-10-CM

## 2013-12-04 DIAGNOSIS — T148 Other injury of unspecified body region: Secondary | ICD-10-CM

## 2013-12-04 LAB — CBC WITH DIFFERENTIAL/PLATELET
Basophils Absolute: 0 10*3/uL (ref 0.0–0.1)
Basophils Relative: 0.7 % (ref 0.0–3.0)
EOS PCT: 3.4 % (ref 0.0–5.0)
Eosinophils Absolute: 0.2 10*3/uL (ref 0.0–0.7)
HCT: 40.8 % (ref 36.0–46.0)
Hemoglobin: 14.1 g/dL (ref 12.0–15.0)
Lymphocytes Relative: 40.5 % (ref 12.0–46.0)
Lymphs Abs: 2.8 10*3/uL (ref 0.7–4.0)
MCHC: 34.5 g/dL (ref 30.0–36.0)
MCV: 91.4 fl (ref 78.0–100.0)
Monocytes Absolute: 0.6 10*3/uL (ref 0.1–1.0)
Monocytes Relative: 8.6 % (ref 3.0–12.0)
NEUTROS PCT: 46.8 % (ref 43.0–77.0)
Neutro Abs: 3.2 10*3/uL (ref 1.4–7.7)
PLATELETS: 311 10*3/uL (ref 150.0–400.0)
RBC: 4.46 Mil/uL (ref 3.87–5.11)
RDW: 12.8 % (ref 11.5–15.5)
WBC: 6.9 10*3/uL (ref 4.0–10.5)

## 2013-12-04 LAB — LIPID PANEL
CHOL/HDL RATIO: 3
Cholesterol: 198 mg/dL (ref 0–200)
HDL: 65.9 mg/dL (ref >39.00)
LDL Cholesterol: 113 mg/dL — ABNORMAL HIGH (ref 0–99)
NonHDL: 132.1
Triglycerides: 95 mg/dL (ref 0.0–149.0)
VLDL: 19 mg/dL (ref 0.0–40.0)

## 2013-12-04 LAB — COMPREHENSIVE METABOLIC PANEL
ALBUMIN: 3.9 g/dL (ref 3.5–5.2)
ALT: 14 U/L (ref 0–35)
AST: 27 U/L (ref 0–37)
Alkaline Phosphatase: 34 U/L — ABNORMAL LOW (ref 39–117)
BUN: 14 mg/dL (ref 6–23)
CO2: 23 mEq/L (ref 19–32)
Calcium: 8.8 mg/dL (ref 8.4–10.5)
Chloride: 104 mEq/L (ref 96–112)
Creatinine, Ser: 0.8 mg/dL (ref 0.4–1.2)
GFR: 81.15 mL/min (ref >60.00)
GLUCOSE: 80 mg/dL (ref 70–99)
POTASSIUM: 4.1 meq/L (ref 3.5–5.1)
SODIUM: 136 meq/L (ref 135–145)
TOTAL PROTEIN: 6.9 g/dL (ref 6.0–8.3)
Total Bilirubin: 0.9 mg/dL (ref 0.2–1.2)

## 2013-12-04 LAB — TSH: TSH: 1.16 u[IU]/mL (ref 0.35–4.50)

## 2013-12-04 MED ORDER — CONJ ESTROG-MEDROXYPROGEST ACE 0.625-5 MG PO TABS: ORAL_TABLET | ORAL | Status: DC

## 2013-12-04 NOTE — Assessment & Plan Note (Signed)
Does not want to wean off of prempro at this time. I did tell her that we would need to try to wean in the future.

## 2013-12-04 NOTE — Patient Instructions (Signed)
Great to see you.  We will call you with your lab results. 

## 2013-12-04 NOTE — Assessment & Plan Note (Signed)
New- unlikely tick borne illness. Will check Lyme titers per her request.  Unlikely RMSF since she had no feer or other symptoms. The patient indicates understanding of these issues and agrees with the plan.

## 2013-12-04 NOTE — Progress Notes (Signed)
Pre visit review using our clinic review tool, if applicable. No additional management support is needed unless otherwise documented below in the visit note. 

## 2013-12-04 NOTE — Assessment & Plan Note (Signed)
>  25 minutes spent in face to face time with patient, >50% spent in counselling or coordination of care Stable on current rx. She does not want to try to wean off it at this point. eRx sent.

## 2013-12-04 NOTE — Progress Notes (Signed)
Subjective:   Patient ID: Veronica [name], female    DOB: 1955/04/17, 58 y.o.   MRN: [id number]  Veronica [name] is a pleasant 58 y.o. year old female who presents to clinic today with Follow-up  on 12/04/2013  HPI:  1.  Anxiety and depression- she feels symptoms are very well controlled on current dose of wellbutrin. Denies any anxiety or depression. Sleeps well. Appetite good- weight stable.  Very active- rides horses. Wt Readings from Last 3 Encounters:  12/04/13 142 lb 8 oz (64.638 kg)  08/14/13 142 lb (64.411 kg)  05/13/13 147 lb (66.679 kg)   2.  Estrogen deficiency/post menopausal syndrome- hot flashes and vaginal dryness resolved on Prempro. Denies any post menopausal bleeding.    3.  Tick bites- spent most of the summer outdoors and traveled quite a bit.  Pulled off multiple ticks, did have a small rash with one.  No rashes currently.  Never had a fever, HA, blurred vision, nausea or vomiting.  Current Outpatient Prescriptions on File Prior to Visit  Medication Sig Dispense Refill  . ANUCORT-HC 25 MG suppository INSERT 1 SUPPOSITORY INTO RECTUM AS NEEDED  15 suppository  0  . buPROPion (WELLBUTRIN XL) 150 MG 24 hr tablet TAKE ONE TABLET BY MOUTH EVERY MORNING  90 tablet  2  . RESTASIS 0.05 % ophthalmic emulsion Place 1 drop into both eyes every 12 (twelve) hours.        No current facility-administered medications on file prior to visit.    No Known Allergies  Past Medical History  Diagnosis Date  . Depression   . Dry eyes     Past Surgical History  Procedure Laterality Date  . Closed reduction hand fracture  2002    left  . Lasik      Family History  Problem Relation Age of Onset  . Colon cancer Neg Hx     History   Social History  . Marital Status: Single    Spouse Name: N/A    Number of Children: N/A  . Years of Education: N/A   Occupational History  . Not on file.   Social History Main Topics  . Smoking status: Former Smoker    Quit date: 06/11/1988    . Smokeless tobacco: Never Used  . Alcohol Use: 3.6 oz/week    6 Glasses of wine per week  . Drug Use: No  . Sexual Activity: Not on file   Other Topics Concern  . Not on file   Social History Narrative  . No narrative on file   The PMH, PSH, Social History, Family History, Medications, and allergies have been reviewed in [facility], and have been updated if relevant.  Review of Systems See HPI No CP or SOB No LE edema No changes in bowel habits No vaginal dryness No hot flashes    Objective:    BP 106/68  Pulse 66  Temp(Src) 98.3 F (36.8 C) (Oral)  Ht 5' 2.5" (1.588 m)  Wt 142 lb 8 oz (64.638 kg)  BMI 25.63 kg/m2  SpO2 98%   Physical Exam  Nursing note and vitals reviewed. Constitutional: She is oriented to person, place, and time. She appears well-developed and well-nourished. No distress.  Musculoskeletal: Normal range of motion.  Neurological: She is alert and oriented to person, place, and time.  Skin: Skin is warm and dry. No rash noted.  Psychiatric: She has a normal mood and affect. Her behavior is normal. Judgment and thought content normal.  Assessment & Plan:   Anxiety and depression  Vaginal atrophy  Screening for ischemic heart disease - Plan: Lipid panel  Routine general medical examination at a health care facility - Plan: CBC with Differential, Comprehensive metabolic panel, TSH No Follow-up on file.

## 2013-12-04 NOTE — Addendum Note (Signed)
Addended by: Modena Nunnery on: 12/04/2013 10:17 AM   Modules accepted: Orders

## 2013-12-08 ENCOUNTER — Encounter: Payer: Self-pay | Admitting: Family Medicine

## 2013-12-08 LAB — B. BURGDORFI ANTIBODIES: B burgdorferi Ab IgG+IgM: 0.36 {ISR}

## 2014-01-12 ENCOUNTER — Encounter: Payer: Self-pay | Admitting: Family Medicine

## 2014-01-13 ENCOUNTER — Encounter: Payer: Self-pay | Admitting: Family Medicine

## 2014-01-13 ENCOUNTER — Ambulatory Visit (INDEPENDENT_AMBULATORY_CARE_PROVIDER_SITE_OTHER): Payer: 59 | Admitting: Family Medicine

## 2014-01-13 VITALS — BP 124/82 | HR 66 | Temp 98.3°F | Wt 147.2 lb

## 2014-01-13 DIAGNOSIS — B9789 Other viral agents as the cause of diseases classified elsewhere: Principal | ICD-10-CM

## 2014-01-13 DIAGNOSIS — J069 Acute upper respiratory infection, unspecified: Secondary | ICD-10-CM

## 2014-01-13 MED ORDER — HYDROCOD POLST-CHLORPHEN POLST 10-8 MG/5ML PO LQCR: 5.0000 mL | Freq: Every evening | ORAL | Status: DC | PRN

## 2014-01-13 NOTE — Assessment & Plan Note (Signed)
Anticipate viral given short duration. Supportive care discussed as per instructions. Cough leading to trouble sleeping - will prescribe tussionex - pt states this has worked well in the past. Update if not improving as expected. Pt agrees with plan.

## 2014-01-13 NOTE — Progress Notes (Signed)
   BP 124/82  Pulse 66  Temp(Src) 98.3 F (36.8 C) (Oral)  Wt 147 lb 4 oz (66.792 kg)  SpO2 98%   CC: "I have the crud"  Subjective:    Patient ID: Veronica [name], female    DOB: 19-Jun-1955, 58 y.o.   MRN: [id number]  HPI: Veronica [name] is a 57 y.o. female presenting on 01/13/2014 for URI   Cough that started 5 days ago, mildly productive. + ST, congestion, headache, feverish.  No ear or tooth pain, nausea. Taking OTC remedies - dayquil, nyquil, aleve No sick contacts at home. No smokers at home. No h/o asthma.  Relevant past medical, surgical, family and social history reviewed and updated as indicated.  Allergies and medications reviewed and updated. Current Outpatient Prescriptions on File Prior to Visit  Medication Sig  . ANUCORT-HC 25 MG suppository INSERT 1 SUPPOSITORY INTO RECTUM AS NEEDED  . buPROPion (WELLBUTRIN XL) 150 MG 24 hr tablet TAKE ONE TABLET BY MOUTH EVERY MORNING  . estrogen, conjugated,-medroxyprogesterone (PREMPRO) 0.625-5 MG per tablet TAKE 1 TABLET DAILY  . RESTASIS 0.05 % ophthalmic emulsion Place 1 drop into both eyes every 12 (twelve) hours.    No current facility-administered medications on file prior to visit.    Review of Systems Per HPI unless specifically indicated above    Objective:    BP 124/82  Pulse 66  Temp(Src) 98.3 F (36.8 C) (Oral)  Wt 147 lb 4 oz (66.792 kg)  SpO2 98%  Physical Exam  Nursing note and vitals reviewed. Constitutional: She appears well-developed and well-nourished. No distress.  HENT:  Head: Normocephalic and atraumatic.  Right Ear: Hearing, tympanic membrane, external ear and ear canal normal.  Left Ear: Hearing, tympanic membrane, external ear and ear canal normal.  Nose: No mucosal edema or rhinorrhea. Right sinus exhibits maxillary sinus tenderness (mild). Right sinus exhibits no frontal sinus tenderness. Left sinus exhibits maxillary sinus tenderness (mild). Left sinus exhibits no frontal sinus tenderness.    Mouth/Throat: Uvula is midline, oropharynx is clear and moist and mucous membranes are normal. No oropharyngeal exudate, posterior oropharyngeal edema, posterior oropharyngeal erythema or tonsillar abscesses.  Eyes: Conjunctivae and EOM are normal. Pupils are equal, round, and reactive to light. No scleral icterus.  Neck: Normal range of motion. Neck supple.  Cardiovascular: Normal rate, regular rhythm, normal heart sounds and intact distal pulses.   No murmur heard. Pulmonary/Chest: Effort normal and breath sounds normal. No respiratory distress. She has no wheezes. She has no rales.  rattly cough present  Lymphadenopathy:    She has no cervical adenopathy.  Skin: Skin is warm and dry. No rash noted.       Assessment & Plan:   Problem List Items Addressed This Visit   Viral URI with cough - Primary     Anticipate viral given short duration. Supportive care discussed as per instructions. Cough leading to trouble sleeping - will prescribe tussionex - pt states this has worked well in the past. Update if not improving as expected. Pt agrees with plan.        Follow up plan: Return if symptoms worsen or fail to improve.

## 2014-01-13 NOTE — Progress Notes (Signed)
Pre visit review using our clinic review tool, if applicable. No additional management support is needed unless otherwise documented below in the visit note. 

## 2014-01-13 NOTE — Patient Instructions (Signed)
Sounds like you have a upper respiratory infection and possible bronchitis but likely viral. Antibiotics are not needed for this.  Viral infections usually take 7-10 days to resolve.  The cough can last a few weeks to go away. Use medication as prescribed: tussionex for cough at night time. Push fluids and plenty of rest. Please return if you are not improving as expected, or if you have high fevers (>101.5) or difficulty swallowing or worsening productive cough. Call clinic with questions.  Good to see you today.

## 2014-05-01 ENCOUNTER — Other Ambulatory Visit: Payer: Self-pay | Admitting: Family Medicine

## 2014-05-05 ENCOUNTER — Encounter: Payer: Self-pay | Admitting: Family Medicine

## 2014-05-06 MED ORDER — BUPROPION HCL ER (XL) 150 MG PO TB24: ORAL_TABLET | ORAL | Status: DC

## 2014-05-18 ENCOUNTER — Encounter: Payer: Self-pay | Admitting: Family Medicine

## 2014-05-18 NOTE — Telephone Encounter (Signed)
Rx sent by [facility]

## 2014-05-19 NOTE — Telephone Encounter (Signed)
Form completed and faxed for approval. Copy sent for scanning

## 2014-05-20 NOTE — Telephone Encounter (Signed)
Response received indicating a prior authorization was not required. PA form and response sent to pharmacy and sent for scanning.

## 2014-10-26 ENCOUNTER — Other Ambulatory Visit: Payer: Self-pay | Admitting: *Deleted

## 2014-10-26 MED ORDER — CONJ ESTROG-MEDROXYPROGEST ACE 0.625-5 MG PO TABS: 1.0000 | ORAL_TABLET | Freq: Every day | ORAL | Status: DC

## 2014-10-26 MED ORDER — BUPROPION HCL ER (XL) 150 MG PO TB24: ORAL_TABLET | ORAL | Status: DC

## 2014-11-15 ENCOUNTER — Encounter: Payer: Self-pay | Admitting: Family Medicine

## 2014-11-16 ENCOUNTER — Telehealth: Payer: Self-pay

## 2014-11-16 NOTE — Telephone Encounter (Signed)
Spoke to pt and advised her, per Dr Deborra Medina, she will need OV

## 2014-11-16 NOTE — Telephone Encounter (Signed)
PLEASE NOTE: All timestamps contained within this report are represented as [place] Standard Time. CONFIDENTIALTY NOTICE: This fax transmission is intended only for the addressee. It contains information that is legally privileged, confidential or otherwise protected from use or disclosure. If you are not the intended recipient, you are strictly prohibited from reviewing, disclosing, copying using or disseminating any of this information or taking any action in reliance on or regarding this information. If you have received this fax in error, please notify us immediately by telephone so that we can arrange for its return to [place]. Phone: [phone], Toll-Free: [phone], Fax: [phone] Page: 1 of 2 Call Id: [id number] [place] Patient Name: Veronica [name] Gender: Female DOB: 19-May-1955 Age: 59 Y 27 M 13 D Return Phone Number: [phone] (Primary) Address: City/State/Zip: [place] Client [facility] - Client Client Site [place] - Day Physician [place], [place] Type Call Call Type Triage / Clinical Relationship To Patient Self Appointment Disposition EMR Patient Refused Appointment Info pasted into Epic Yes Return Phone Number [phone] (Primary) Chief Complaint Poison [place], [place] Initial Comment Caller states needs something called in for poison ivy PreDisposition Call Doctor Nurse Assessment Nurse: Wynetta Emery, RN, [place] Janus Date/Time Eilene Ghazi Time): 11/15/2014 5:56:45 PM Confirm and document reason for call. If symptomatic, describe symptoms. ---Leontyne has contracted poison ivy -- ivy dry and claritin OTC medications and not working -- Has it on left leg and some on right calf. right foreamarm Has the patient traveled out of the country within the last 30 days? ---No Does the patient require triage? ---Yes Related visit to physician within  the last 2 weeks? ---No Does the PT have any chronic conditions? (i.e. diabetes, asthma, etc.) ---No Guidelines Guideline Title Affirmed Question Affirmed Notes Nurse Date/Time (Eastern Time) Poison Ivy - [place] SEVERE itching (e.g., interferes with sleep or normal activities) Wynetta Emery, Pension scheme manager 11/15/2014 6:00:00 PM Disp. Time Eilene Ghazi Time) Disposition Final User 11/15/2014 5:19:38 PM Attempt made - message left Ivin Booty 11/15/2014 5:45:06 PM Attempt made - message left Ivin Booty 11/15/2014 6:01:58 PM See Physician within 24 Hours Yes Wynetta Emery, RN, Christin Bach Understands: Yes Disagree/Comply: Comply PLEASE NOTE: All timestamps contained within this report are represented as [place] Standard Time. CONFIDENTIALTY NOTICE: This fax transmission is intended only for the addressee. It contains information that is legally privileged, confidential or otherwise protected from use or disclosure. If you are not the intended recipient, you are strictly prohibited from reviewing, disclosing, copying using or disseminating any of this information or taking any action in reliance on or regarding this information. If you have received this fax in error, please notify us immediately by telephone so that we can arrange for its return to [place]. Phone: [phone], Toll-Free: [phone], Fax: [phone] Page: 2 of 2 Call Id: [id number] Care Advice Given Per Guideline SEE PHYSICIAN WITHIN 24 HOURS: * Apply hydrocortisone cream 4 times a day for 5 days. * Keep the cream in the refrigerator (Reason: it feels better if applied cold) LOCAL COLD: Soak the involved area in cool water for 20 minutes or massage it with an ice cube as often as necessary to reduce itching and oozing. * Take an antihistamine by mouth to reduce the itching. Benadryl (OTC diphenhydramine) is best. Adult dose is 25-50 mg. Take it up to 4 times a day. * If Benadryl is not available, use any hay  fever or cold medicine  that contains an antihistamine. * Examples include diphenhydramine (Benadryl) and chlorpheniramine (Chlortrimeton, Chlor-tripolon) * May cause sleepiness. Do not drink alcohol, drive or operate dangerous machinery while taking antihistamines. * Do not take these medicines if you have prostate problems. * Read the package instructions and warnings. AVOID SCRATCHING: Cut the fingernails short and avoid scratching to prevent a secondary infection from bacteria. * You become worse. After Care Instructions Given Call Event Type User Date / Time Description Comments User: Michele Rockers, RN Date/Time Eilene Ghazi Time): 11/15/2014 6:04:32 PM She is out of state will fly in tomorrow would like something called in to the [facility] so she can have it when she gets home tomorrow night. No appt made states will come in if gets worse just needs to stop itch nothing working. Referrals REFERRED TO PCP OFFICE

## 2014-12-21 ENCOUNTER — Ambulatory Visit: Payer: Self-pay

## 2014-12-24 ENCOUNTER — Ambulatory Visit (INDEPENDENT_AMBULATORY_CARE_PROVIDER_SITE_OTHER): Payer: 59

## 2014-12-24 DIAGNOSIS — Z23 Encounter for immunization: Secondary | ICD-10-CM | POA: Diagnosis not present

## 2015-01-04 ENCOUNTER — Other Ambulatory Visit: Payer: Self-pay | Admitting: Family Medicine

## 2015-01-05 MED ORDER — HYDROCORTISONE ACETATE 25 MG RE SUPP: RECTAL | Status: DC

## 2015-01-07 ENCOUNTER — Encounter: Payer: Self-pay | Admitting: Family Medicine

## 2015-01-07 ENCOUNTER — Other Ambulatory Visit: Payer: Self-pay | Admitting: Family Medicine

## 2015-01-07 MED ORDER — HYDROCORTISONE ACETATE 25 MG RE SUPP: RECTAL | Status: DC

## 2015-01-17 ENCOUNTER — Other Ambulatory Visit: Payer: Self-pay | Admitting: *Deleted

## 2015-01-17 MED ORDER — CONJ ESTROG-MEDROXYPROGEST ACE 0.625-5 MG PO TABS: 1.0000 | ORAL_TABLET | Freq: Every day | ORAL | Status: DC

## 2015-02-17 ENCOUNTER — Other Ambulatory Visit: Payer: Self-pay | Admitting: Family Medicine

## 2015-02-17 MED ORDER — BUPROPION HCL ER (XL) 150 MG PO TB24: ORAL_TABLET | ORAL | Status: DC

## 2015-02-17 NOTE — Telephone Encounter (Signed)
Gave 30 day supply, she needs appt for further refills

## 2015-02-17 NOTE — Telephone Encounter (Signed)
Pt has not had f/u appt since 12/2013

## 2015-06-19 ENCOUNTER — Other Ambulatory Visit: Payer: Self-pay | Admitting: Family Medicine

## 2015-07-07 ENCOUNTER — Ambulatory Visit (INDEPENDENT_AMBULATORY_CARE_PROVIDER_SITE_OTHER): Payer: 59 | Admitting: Family Medicine

## 2015-07-07 ENCOUNTER — Other Ambulatory Visit (HOSPITAL_COMMUNITY)
Admission: RE | Admit: 2015-07-07 | Discharge: 2015-07-07 | Disposition: A | Discharge status: Home / Self Care | Payer: 59 | Source: Ambulatory Visit | Attending: Family Medicine | Admitting: Family Medicine

## 2015-07-07 ENCOUNTER — Encounter: Payer: Self-pay | Admitting: Family Medicine

## 2015-07-07 VITALS — BP 104/60 | HR 64 | Temp 98.0°F | Ht 62.5 in | Wt 153.5 lb

## 2015-07-07 DIAGNOSIS — F419 Anxiety disorder, unspecified: Secondary | ICD-10-CM

## 2015-07-07 DIAGNOSIS — F418 Other specified anxiety disorders: Secondary | ICD-10-CM | POA: Diagnosis not present

## 2015-07-07 DIAGNOSIS — Z01419 Encounter for gynecological examination (general) (routine) without abnormal findings: Secondary | ICD-10-CM | POA: Insufficient documentation

## 2015-07-07 DIAGNOSIS — Z1159 Encounter for screening for other viral diseases: Secondary | ICD-10-CM | POA: Diagnosis not present

## 2015-07-07 DIAGNOSIS — N760 Acute vaginitis: Secondary | ICD-10-CM | POA: Diagnosis present

## 2015-07-07 DIAGNOSIS — Z1151 Encounter for screening for human papillomavirus (HPV): Secondary | ICD-10-CM | POA: Insufficient documentation

## 2015-07-07 DIAGNOSIS — Z113 Encounter for screening for infections with a predominantly sexual mode of transmission: Secondary | ICD-10-CM | POA: Insufficient documentation

## 2015-07-07 DIAGNOSIS — Z0000 Encounter for general adult medical examination without abnormal findings: Secondary | ICD-10-CM | POA: Diagnosis not present

## 2015-07-07 DIAGNOSIS — F32A Depression, unspecified: Secondary | ICD-10-CM

## 2015-07-07 DIAGNOSIS — F329 Major depressive disorder, single episode, unspecified: Secondary | ICD-10-CM

## 2015-07-07 LAB — CBC WITH DIFFERENTIAL/PLATELET
BASOS PCT: 0.6 % (ref 0.0–3.0)
Basophils Absolute: 0 10*3/uL (ref 0.0–0.1)
Eosinophils Absolute: 0.2 10*3/uL (ref 0.0–0.7)
Eosinophils Relative: 3.1 % (ref 0.0–5.0)
HEMATOCRIT: 42.8 % (ref 36.0–46.0)
Hemoglobin: 14.6 g/dL (ref 12.0–15.0)
Lymphocytes Relative: 37.6 % (ref 12.0–46.0)
Lymphs Abs: 2.4 10*3/uL (ref 0.7–4.0)
MCHC: 34.2 g/dL (ref 30.0–36.0)
MCV: 92.8 fl (ref 78.0–100.0)
MONO ABS: 0.6 10*3/uL (ref 0.1–1.0)
Monocytes Relative: 9.2 % (ref 3.0–12.0)
NEUTROS ABS: 3.2 10*3/uL (ref 1.4–7.7)
Neutrophils Relative %: 49.5 % (ref 43.0–77.0)
PLATELETS: 338 10*3/uL (ref 150.0–400.0)
RBC: 4.62 Mil/uL (ref 3.87–5.11)
RDW: 13.3 % (ref 11.5–15.5)
WBC: 6.5 10*3/uL (ref 4.0–10.5)

## 2015-07-07 LAB — COMPREHENSIVE METABOLIC PANEL
ALT: 15 U/L (ref 0–35)
AST: 23 U/L (ref 0–37)
Albumin: 4.3 g/dL (ref 3.5–5.2)
Alkaline Phosphatase: 41 U/L (ref 39–117)
BUN: 14 mg/dL (ref 6–23)
CALCIUM: 9.5 mg/dL (ref 8.4–10.5)
CHLORIDE: 105 meq/L (ref 96–112)
CO2: 25 meq/L (ref 19–32)
Creatinine, Ser: 0.77 mg/dL (ref 0.40–1.20)
GFR: 81.9 mL/min (ref >60.00)
Glucose, Bld: 76 mg/dL (ref 70–99)
Potassium: 4.1 mEq/L (ref 3.5–5.1)
SODIUM: 136 meq/L (ref 135–145)
Total Bilirubin: 0.8 mg/dL (ref 0.2–1.2)
Total Protein: 7.2 g/dL (ref 6.0–8.3)

## 2015-07-07 LAB — LIPID PANEL
Cholesterol: 202 mg/dL — ABNORMAL HIGH (ref 0–200)
HDL: 72.3 mg/dL (ref >39.00)
LDL Cholesterol: 119 mg/dL — ABNORMAL HIGH (ref 0–99)
NONHDL: 129.71
Total CHOL/HDL Ratio: 3
Triglycerides: 56 mg/dL (ref 0.0–149.0)
VLDL: 11.2 mg/dL (ref 0.0–40.0)

## 2015-07-07 LAB — HIV ANTIBODY (ROUTINE TESTING W REFLEX): HIV: NONREACTIVE

## 2015-07-07 LAB — TSH: TSH: 1.79 u[IU]/mL (ref 0.35–4.50)

## 2015-07-07 LAB — HEPATITIS C ANTIBODY: HCV Ab: NEGATIVE

## 2015-07-07 MED ORDER — BUPROPION HCL ER (XL) 150 MG PO TB24: ORAL_TABLET | ORAL | Status: DC

## 2015-07-07 MED ORDER — CONJ ESTROG-MEDROXYPROGEST ACE 0.625-5 MG PO TABS: 1.0000 | ORAL_TABLET | Freq: Every day | ORAL | Status: DC

## 2015-07-07 MED ORDER — SUMATRIPTAN-NAPROXEN SODIUM 85-500 MG PO TABS: 1.0000 | ORAL_TABLET | ORAL | Status: DC | PRN

## 2015-07-07 NOTE — Addendum Note (Signed)
Addended by: Modena Nunnery on: 07/07/2015 09:47 AM   Modules accepted: Orders

## 2015-07-07 NOTE — Patient Instructions (Signed)
Good to see you. We will call you with your results from today. 

## 2015-07-07 NOTE — Assessment & Plan Note (Signed)
Reviewed preventive care protocols, scheduled due services, and updated immunizations Discussed nutrition, exercise, diet, and healthy lifestyle.  Pap smear done today.  Orders Placed This Encounter  Procedures  . CBC with Differential/Platelet  . Comprehensive metabolic panel  . Lipid panel  . TSH  . HIV antibody (with reflex)  . Hepatitis C Antibody

## 2015-07-07 NOTE — Assessment & Plan Note (Signed)
Well controlled on current rx. No changes made today. 

## 2015-07-07 NOTE — Progress Notes (Signed)
Subjective:   Patient ID: Veronica [name], female    DOB: 1955/12/19, 60 y.o.   MRN: [id number]  Veronica [name] is a pleasant 60 y.o. year old female who presents to clinic today with Annual Exam  and follow up of chronic medical conditions on 07/07/2015  HPI:     Colonoscopy 07/02/12 No h/o abnormal pap smears or post menopausal bleeding. Last pap smear was done by me on 04/11/12 Mammogram 01/06/15  Anxiety and depression- she feels symptoms are very well controlled on current dose of wellbutrin. Denies any anxiety or depression. Sleeps well.  Estrogen deficiency/post menopausal syndrome- hot flashes and vaginal dryness resolved on Prempro. Denies any post menopausal bleeding  Lab Results  Component Value Date   CHOL 198 12/04/2013   HDL 65.90 12/04/2013   LDLCALC 113* 12/04/2013   LDLDIRECT 134.4 02/08/2009   TRIG 95.0 12/04/2013   CHOLHDL 3 12/04/2013   Lab Results  Component Value Date   CREATININE 0.8 12/04/2013   Lab Results  Component Value Date   TSH 1.16 12/04/2013   Lab Results  Component Value Date   WBC 6.9 12/04/2013   HGB 14.1 12/04/2013   HCT 40.8 12/04/2013   MCV 91.4 12/04/2013   PLT 311.0 12/04/2013   Current Outpatient Prescriptions on File Prior to Visit  Medication Sig Dispense Refill  . hydrocortisone (ANUCORT-HC) 25 MG suppository INSERT 1 SUPPOSITORY INTO RECTUM AS NEEDED 15 suppository 0  . RESTASIS 0.05 % ophthalmic emulsion Place 1 drop into both eyes every 12 (twelve) hours.      No current facility-administered medications on file prior to visit.    No Known Allergies  Past Medical History  Diagnosis Date  . Depression   . Dry eyes     Past Surgical History  Procedure Laterality Date  . Closed reduction hand fracture  2002    left  . Lasik      Family History  Problem Relation Age of Onset  . Colon cancer Neg Hx     Social History   Social History  . Marital Status: Single    Spouse Name: N/A  . Number of Children: N/A  .  Years of Education: N/A   Occupational History  . Not on file.   Social History Main Topics  . Smoking status: Former Smoker    Quit date: 06/11/1988  . Smokeless tobacco: Never Used  . Alcohol Use: 3.6 oz/week    6 Glasses of wine per week  . Drug Use: No  . Sexual Activity: Not on file   Other Topics Concern  . Not on file   Social History Narrative   The PMH, PSH, Social History, Family History, Medications, and allergies have been reviewed in [facility], and have been updated if relevant.   Review of Systems  Constitutional: Negative.   HENT: Negative.   Eyes: Negative.   Respiratory: Negative.   Cardiovascular: Negative.   Gastrointestinal: Negative.   Endocrine: Negative.   Genitourinary: Negative.   Musculoskeletal: Negative.   Skin: Negative.   Allergic/Immunologic: Negative.   Neurological: Negative.   Hematological: Negative.   Psychiatric/Behavioral: Negative.   All other systems reviewed and are negative.      Objective:    BP 104/60 mmHg  Pulse 64  Temp(Src) 98 F (36.7 C) (Oral)  Ht 5' 2.5" (1.588 m)  Wt 153 lb 8 oz (69.627 kg)  BMI 27.61 kg/m2  SpO2 98%   Physical Exam    General:  Well-developed,well-nourished,in no acute distress;  alert,appropriate and cooperative throughout examination Head:  normocephalic and atraumatic.   Eyes:  vision grossly intact, pupils equal, pupils round, and pupils reactive to light.   Ears:  R ear normal and L ear normal.   Nose:  no external deformity.   Mouth:  good dentition.   Neck:  No deformities, masses, or tenderness noted. Breasts:  No mass, nodules, thickening, tenderness, bulging, retraction, inflamation, nipple discharge or skin changes noted.   Lungs:  Normal respiratory effort, chest expands symmetrically. Lungs are clear to auscultation, no crackles or wheezes. Heart:  Normal rate and regular rhythm. S1 and S2 normal without gallop, murmur, click, rub or other extra sounds. Abdomen:  Bowel sounds  positive,abdomen soft and non-tender without masses, organomegaly or hernias noted. Rectal:  no external abnormalities.   Genitalia:  Pelvic Exam:        External: normal female genitalia without lesions or masses        Vagina: normal without lesions or masses        Cervix: normal without lesions or masses        Adnexa: normal bimanual exam without masses or fullness        Uterus: normal by palpation        Pap smear: performed Msk:  No deformity or scoliosis noted of thoracic or lumbar spine.   Extremities:  No clubbing, cyanosis, edema, or deformity noted with normal full range of motion of all joints.   Neurologic:  alert & oriented X3 and gait normal.   Skin:  Intact without suspicious lesions or rashes Cervical Nodes:  No lymphadenopathy noted Axillary Nodes:  No palpable lymphadenopathy Psych:  Cognition and judgment appear intact. Alert and cooperative with normal attention span and concentration. No apparent delusions, illusions, hallucinations      Assessment & Plan:   Anxiety and depression  Well woman exam No Follow-up on file.

## 2015-07-08 LAB — CYTOLOGY - PAP

## 2015-07-11 ENCOUNTER — Encounter: Payer: Self-pay | Admitting: *Deleted

## 2015-07-11 LAB — CERVICOVAGINAL ANCILLARY ONLY
BACTERIAL VAGINITIS: NEGATIVE
CANDIDA VAGINITIS: NEGATIVE

## 2015-07-15 LAB — CERVICOVAGINAL ANCILLARY ONLY: HERPES (WINDOWPATH): NEGATIVE

## 2015-11-28 ENCOUNTER — Other Ambulatory Visit: Payer: Self-pay | Admitting: Family Medicine

## 2015-12-20 ENCOUNTER — Encounter: Payer: Self-pay | Admitting: Family Medicine

## 2015-12-23 ENCOUNTER — Ambulatory Visit (INDEPENDENT_AMBULATORY_CARE_PROVIDER_SITE_OTHER): Payer: 59

## 2015-12-23 DIAGNOSIS — Z23 Encounter for immunization: Secondary | ICD-10-CM | POA: Diagnosis not present

## 2016-02-08 ENCOUNTER — Other Ambulatory Visit: Payer: Self-pay | Admitting: Family Medicine

## 2016-02-08 ENCOUNTER — Encounter: Payer: Self-pay | Admitting: Family Medicine

## 2016-02-08 MED ORDER — BUPROPION HCL ER (XL) 150 MG PO TB24: ORAL_TABLET | ORAL | 1 refills | Status: DC

## 2016-02-08 MED ORDER — CONJ ESTROG-MEDROXYPROGEST ACE 0.625-5 MG PO TABS: 1.0000 | ORAL_TABLET | Freq: Every day | ORAL | 1 refills | Status: DC

## 2016-02-17 NOTE — Telephone Encounter (Signed)
Pt left v/m requesting cb [phone] about refills for buproprion and prempro. Left v/m requesting pt to cb.

## 2016-02-20 MED ORDER — CONJ ESTROG-MEDROXYPROGEST ACE 0.625-5 MG PO TABS: 1.0000 | ORAL_TABLET | Freq: Every day | ORAL | 1 refills | Status: DC

## 2016-02-20 MED ORDER — BUPROPION HCL ER (XL) 150 MG PO TB24: ORAL_TABLET | ORAL | 1 refills | Status: DC

## 2016-02-20 NOTE — Telephone Encounter (Signed)
Pt called back to ck on status of refills; advised sent to [facility] [address]. Pt had requested [facility]. Spoke with Caryl Pina at [facility] and d.c the 2 refills and sent electronically to [facility]; apologized to pt and sent meds to [facility]. Pt voiced understanding.

## 2016-04-04 ENCOUNTER — Encounter: Payer: Self-pay | Admitting: Family Medicine

## 2016-04-04 ENCOUNTER — Ambulatory Visit (INDEPENDENT_AMBULATORY_CARE_PROVIDER_SITE_OTHER): Payer: 59 | Admitting: Family Medicine

## 2016-04-04 VITALS — BP 130/72 | HR 71 | Temp 98.1°F | Wt 163.0 lb

## 2016-04-04 DIAGNOSIS — J069 Acute upper respiratory infection, unspecified: Secondary | ICD-10-CM | POA: Diagnosis not present

## 2016-04-04 MED ORDER — HYDROCOD POLST-CPM POLST ER 10-8 MG/5ML PO SUER: 5.0000 mL | Freq: Two times a day (BID) | ORAL | 0 refills | Status: DC | PRN

## 2016-04-04 NOTE — Patient Instructions (Signed)
Happy New Year!  Great to see you.    Treat sympotmatically with Mucinex, nasal saline irrigation, and Tylenol/Ibuprofen.   Also try an antihistamine/decongestant like claritin D or zyrtec D over the counter- two times a day as needed ( have to sign for them at pharmacy).     Cough suppressant at night.   Call if not improving as expected in 5-7 days.

## 2016-04-04 NOTE — Progress Notes (Signed)
Pre visit review using our clinic review tool, if applicable. No additional management support is needed unless otherwise documented below in the visit note. 

## 2016-04-04 NOTE — Progress Notes (Signed)
SUBJECTIVE:  Veronica [name] is a 61 y.o. female who complains of coryza, congestion and dry cough for 4 days. She denies a history of anorexia and chest pain and denies a history of asthma. Patient denies smoke cigarettes.   Current Outpatient Prescriptions on File Prior to Visit  Medication Sig Dispense Refill  . buPROPion (WELLBUTRIN XL) 150 MG 24 hr tablet TAKE ONE TABLET BY MOUTH EVERY MORNING 90 tablet 1  . estrogen, conjugated,-medroxyprogesterone (PREMPRO) 0.625-5 MG tablet Take 1 tablet by mouth daily. 84 tablet 1  . hydrocortisone (ANUCORT-HC) 25 MG suppository INSERT 1 SUPPOSITORY INTO RECTUM AS NEEDED 15 suppository 0  . RESTASIS 0.05 % ophthalmic emulsion Place 1 drop into both eyes every 12 (twelve) hours.     . SUMAtriptan-naproxen (TREXIMET) 85-500 MG tablet Take 1 tablet by mouth every 2 (two) hours as needed for migraine. 10 tablet 0   No current facility-administered medications on file prior to visit.     No Known Allergies  Past Medical History:  Diagnosis Date  . Depression   . Dry eyes     Past Surgical History:  Procedure Laterality Date  . CLOSED REDUCTION HAND FRACTURE  2002   left  . LASIK      Family History  Problem Relation Age of Onset  . Colon cancer Neg Hx     Social History   Social History  . Marital status: Single    Spouse name: N/A  . Number of children: N/A  . Years of education: N/A   Occupational History  . Not on file.   Social History Main Topics  . Smoking status: Former Smoker    Quit date: 06/11/1988  . Smokeless tobacco: Never Used  . Alcohol use 3.6 oz/week    6 Glasses of wine per week  . Drug use: No  . Sexual activity: Not on file   Other Topics Concern  . Not on file   Social History Narrative  . No narrative on file   The PMH, PSH, Social History, Family History, Medications, and allergies have been reviewed in [facility], and have been updated if relevant.  OBJECTIVE: BP 130/72   Pulse 71   Temp 98.1 F (36.7  C) (Oral)   Wt 163 lb (73.9 kg)   SpO2 99%   BMI 29.34 kg/m   She appears well, vital signs are as noted. Ears normal.  Throat and pharynx normal.  Neck supple. No adenopathy in the neck. Nose is congested. Sinuses non tender. The chest is clear, without wheezes or rales.  ASSESSMENT:  viral upper respiratory illness  PLAN: Symptomatic therapy suggested: push fluids, rest and return office visit prn if symptoms persist or worsen. Lack of antibiotic effectiveness discussed with her. Call or return to clinic prn if these symptoms worsen or fail to improve as anticipated.

## 2016-04-05 ENCOUNTER — Ambulatory Visit: Payer: Self-pay | Admitting: Family Medicine

## 2016-05-09 ENCOUNTER — Encounter: Payer: Self-pay | Admitting: Family Medicine

## 2016-05-10 ENCOUNTER — Ambulatory Visit (INDEPENDENT_AMBULATORY_CARE_PROVIDER_SITE_OTHER): Payer: 59 | Admitting: Family Medicine

## 2016-05-10 ENCOUNTER — Encounter: Payer: Self-pay | Admitting: Family Medicine

## 2016-05-10 VITALS — BP 128/78 | HR 69 | Temp 98.4°F | Wt 159.5 lb

## 2016-05-10 DIAGNOSIS — J069 Acute upper respiratory infection, unspecified: Secondary | ICD-10-CM

## 2016-05-10 MED ORDER — HYDROCOD POLST-CPM POLST ER 10-8 MG/5ML PO SUER: 5.0000 mL | Freq: Two times a day (BID) | ORAL | 0 refills | Status: DC | PRN

## 2016-05-10 MED ORDER — AMOXICILLIN-POT CLAVULANATE 875-125 MG PO TABS: 1.0000 | ORAL_TABLET | Freq: Two times a day (BID) | ORAL | 0 refills | Status: AC

## 2016-05-10 NOTE — Progress Notes (Signed)
Pre visit review using our clinic review tool, if applicable. No additional management support is needed unless otherwise documented below in the visit note. 

## 2016-05-10 NOTE — Progress Notes (Signed)
SUBJECTIVE:  Veronica [name] is a 61 y.o. female who complains of coryza, congestion and productive cough for 7 days. She denies a history of anorexia and chest pain and denies a history of asthma. Patient denies smoke cigarettes.   Current Outpatient Prescriptions on File Prior to Visit  Medication Sig Dispense Refill  . buPROPion (WELLBUTRIN XL) 150 MG 24 hr tablet TAKE ONE TABLET BY MOUTH EVERY MORNING 90 tablet 1  . estrogen, conjugated,-medroxyprogesterone (PREMPRO) 0.625-5 MG tablet Take 1 tablet by mouth daily. 84 tablet 1  . hydrocortisone (ANUCORT-HC) 25 MG suppository INSERT 1 SUPPOSITORY INTO RECTUM AS NEEDED 15 suppository 0  . RESTASIS 0.05 % ophthalmic emulsion Place 1 drop into both eyes every 12 (twelve) hours.     . SUMAtriptan-naproxen (TREXIMET) 85-500 MG tablet Take 1 tablet by mouth every 2 (two) hours as needed for migraine. 10 tablet 0   No current facility-administered medications on file prior to visit.     No Known Allergies  Past Medical History:  Diagnosis Date  . Depression   . Dry eyes     Past Surgical History:  Procedure Laterality Date  . CLOSED REDUCTION HAND FRACTURE  2002   left  . LASIK      Family History  Problem Relation Age of Onset  . Colon cancer Neg Hx     Social History   Social History  . Marital status: Single    Spouse name: N/A  . Number of children: N/A  . Years of education: N/A   Occupational History  . Not on file.   Social History Main Topics  . Smoking status: Former Smoker    Quit date: 06/11/1988  . Smokeless tobacco: Never Used  . Alcohol use 3.6 oz/week    6 Glasses of wine per week  . Drug use: No  . Sexual activity: Not on file   Other Topics Concern  . Not on file   Social History Narrative  . No narrative on file   The PMH, PSH, Social History, Family History, Medications, and allergies have been reviewed in [facility], and have been updated if relevant.  OBJECTIVE: BP 128/78   Pulse 69   Temp 98.4 F  (36.9 C) (Oral)   Wt 159 lb 8 oz (72.3 kg)   SpO2 98%   BMI 28.71 kg/m   She appears well, vital signs are as noted. Ears normal.  Throat and pharynx normal.  Neck supple. No adenopathy in the neck. Nose is congested. Sinuses  tender. The chest is clear, without wheezes or rales.  ASSESSMENT:  sinusitis  PLAN: Symptomatic therapy suggested: push fluids, rest and return office visit prn if symptoms persist or worsen. . Call or return to clinic prn if these symptoms worsen or fail to improve as anticipated.

## 2016-07-04 DIAGNOSIS — D225 Melanocytic nevi of trunk: Secondary | ICD-10-CM | POA: Diagnosis not present

## 2016-10-06 ENCOUNTER — Other Ambulatory Visit: Payer: Self-pay | Admitting: Family Medicine

## 2016-10-31 ENCOUNTER — Ambulatory Visit (INDEPENDENT_AMBULATORY_CARE_PROVIDER_SITE_OTHER)
Admission: RE | Admit: 2016-10-31 | Discharge: 2016-10-31 | Disposition: A | Discharge status: Home / Self Care | Payer: 59 | Source: Ambulatory Visit | Attending: Family Medicine | Admitting: Family Medicine

## 2016-10-31 ENCOUNTER — Ambulatory Visit (INDEPENDENT_AMBULATORY_CARE_PROVIDER_SITE_OTHER): Payer: 59 | Admitting: Family Medicine

## 2016-10-31 ENCOUNTER — Encounter: Payer: Self-pay | Admitting: Family Medicine

## 2016-10-31 VITALS — BP 132/80 | HR 59 | Temp 98.3°F | Ht 62.5 in | Wt 150.5 lb

## 2016-10-31 DIAGNOSIS — M25562 Pain in left knee: Secondary | ICD-10-CM | POA: Diagnosis not present

## 2016-10-31 DIAGNOSIS — M1712 Unilateral primary osteoarthritis, left knee: Secondary | ICD-10-CM | POA: Diagnosis not present

## 2016-10-31 DIAGNOSIS — M179 Osteoarthritis of knee, unspecified: Secondary | ICD-10-CM | POA: Diagnosis not present

## 2016-10-31 DIAGNOSIS — M7050 Other bursitis of knee, unspecified knee: Secondary | ICD-10-CM | POA: Diagnosis not present

## 2016-10-31 MED ORDER — METHYLPREDNISOLONE ACETATE 40 MG/ML IJ SUSP
80.0000 mg | Freq: Once | INTRAMUSCULAR | Status: AC
  Administered 2016-10-31: 80 mg via INTRA_ARTICULAR

## 2016-10-31 NOTE — Progress Notes (Signed)
Dr. Frederico Hamman T. Diamone Whistler, MD, [facility] [place], [address] Phone: [phone] Fax: [phone]  10/31/2016  Patient: Veronica [name], MRN: [id number], DOB: 10-Nov-1955, 61 y.o.  Primary Physician:  Lucille Passy, MD   Chief Complaint  Patient presents with  . Knee Pain    left    Subjective:   Veronica [name] is a 61 y.o. very pleasant female patient who presents with the following:  Left Medial knee pain 3-4 months. No injury that she can recall. [place] out of horse trailer a few months ago.  Prior injection 10 years ago. No h/o fracture or surgery. Has been trying NSAIDS and ice at home.  Despite all of this care at home, she has been having persistent pain.  No mechanical buckling or locking up of the joint.  inj  Past Medical History, Surgical History, Social History, Family History, Problem List, Medications, and Allergies have been reviewed and updated if relevant.  Patient Active Problem List   Diagnosis Date Noted  . Acute upper respiratory infection 04/04/2016  . Anxiety and depression 04/11/2012    Past Medical History:  Diagnosis Date  . Depression   . Dry eyes     Past Surgical History:  Procedure Laterality Date  . CLOSED REDUCTION HAND FRACTURE  2002   left  . LASIK      Social History   Social History  . Marital status: Single    Spouse name: N/A  . Number of children: N/A  . Years of education: N/A   Occupational History  . Not on file.   Social History Main Topics  . Smoking status: Former Smoker    Quit date: 06/11/1988  . Smokeless tobacco: Never Used  . Alcohol use 3.6 oz/week    6 Glasses of wine per week  . Drug use: No  . Sexual activity: Not on file   Other Topics Concern  . Not on file   Social History Narrative  . No narrative on file    Family History  Problem Relation Age of Onset  . Colon cancer Neg Hx     No Known Allergies  Medication list reviewed and  updated in full in [place].  GEN: No fevers, chills. Nontoxic. Primarily MSK c/o today. MSK: Detailed in the HPI GI: tolerating PO intake without difficulty Neuro: No numbness, parasthesias, or tingling associated. Otherwise the pertinent positives of the ROS are noted above.   Objective:   BP 132/80   Pulse (!) 59   Temp 98.3 F (36.8 C) (Oral)   Ht 5' 2.5" (1.588 m)   Wt 150 lb 8 oz (68.3 kg)   BMI 27.09 kg/m    GEN: WDWN, NAD, Non-toxic, Alert & Oriented x 3 HEENT: Atraumatic, Normocephalic.  Ears and Nose: No external deformity. EXTR: No clubbing/cyanosis/edema NEURO: Normal gait.  PSYCH: Normally interactive. Conversant. Not depressed or anxious appearing.  Calm demeanor.   Knee:  L Gait: Normal heel toe pattern ROM: 0-120 Effusion: neg Echymosis or edema: none Patellar tendon NT Painful PLICA: neg Patellar grind: negative Medial and lateral patellar facet loading: negative medial and lateral joint lines: medial > lateral ttp Mcmurray's pain Flexion-pinch pain Varus and valgus stress: stable Lachman: neg Ant and Post drawer: neg Hip abduction, IR, ER: WNL Hip flexion str: 5/5 Hip abd: 5/5 Quad: 5/5 VMO atrophy:No Hamstring concentric and eccentric: 5/5   Radiology: Dg Knee 4 Views W/patella Left  Result  Date: 10/31/2016 CLINICAL DATA:  61 year old female with left knee pain EXAM: LEFT KNEE - COMPLETE 4+ VIEW COMPARISON:  None. FINDINGS: No evidence of acute fracture or malalignment. No knee joint effusion. Degenerative osteoarthritis is present in the medial compartment were there is joint space narrowing, subchondral sclerosis and osteophyte formation. Mild peaking of the tibial spines. Normal bony mineralization. No lytic or blastic osseous lesion. IMPRESSION: Mild to moderate medial compartment degenerative osteoarthritis. No evidence of acute fracture, malalignment or knee joint effusion. Electronically Signed   By: Jacqulynn Cadet M.D.   On:  10/31/2016 11:42     Assessment and Plan:   Primary osteoarthritis of left knee  Left knee pain, unspecified chronicity - Plan: DG Knee 4 Views W/Patella Left, methylPREDNISolone acetate (DEPO-MEDROL) injection 80 mg  Pes anserine bursitis - Plan: methylPREDNISolone acetate (DEPO-MEDROL) injection 80 mg  Primary issue appears to be moderate osteoarthritis.  OA exacerbation, cannot fully exclude degenerative meniscal tear.  Continue with conservative care with rice.  Knee Injection, L Patient verbally consented to procedure. Risks (including potential rare risk of infection), benefits, and alternatives explained. Sterilely prepped with Chloraprep. Ethyl cholride used for anesthesia. 8 cc Lidocaine 1% mixed with 2 mL Depo-Medrol 40 mg injected using the anteromedial approach without difficulty. No complications with procedure and tolerated well. Patient had decreased pain post-injection.   Follow-up: if cont sx > 4 weeks  Meds ordered this encounter  Medications  . methylPREDNISolone acetate (DEPO-MEDROL) injection 80 mg   There are no discontinued medications. Orders Placed This Encounter  Procedures  . DG Knee 4 Views W/Patella Left    Signed,  Kalecia Hartney T. Aayushi Solorzano, MD   Allergies as of 10/31/2016   No Known Allergies     Medication List       Accurate as of 10/31/16 11:59 PM. Always use your most recent med list.          buPROPion 150 MG 24 hr tablet Commonly known as:  WELLBUTRIN XL TAKE 1 TABLET EVERY MORNING   chlorpheniramine-HYDROcodone 10-8 MG/5ML Suer Commonly known as:  TUSSIONEX PENNKINETIC ER Take 5 mLs by mouth every 12 (twelve) hours as needed.   estrogen (conjugated)-medroxyprogesterone 0.625-5 MG tablet Commonly known as:  PREMPRO Take 1 tablet by mouth daily.   hydrocortisone 25 MG suppository Commonly known as:  ANUCORT-HC INSERT 1 SUPPOSITORY INTO RECTUM AS NEEDED   RESTASIS 0.05 % ophthalmic emulsion Generic drug:  cycloSPORINE Place 1  drop into both eyes every 12 (twelve) hours.   SUMAtriptan-naproxen 85-500 MG tablet Commonly known as:  TREXIMET Take 1 tablet by mouth every 2 (two) hours as needed for migraine.

## 2016-12-17 ENCOUNTER — Encounter: Payer: Self-pay | Admitting: Family Medicine

## 2016-12-18 ENCOUNTER — Ambulatory Visit (INDEPENDENT_AMBULATORY_CARE_PROVIDER_SITE_OTHER): Payer: 59

## 2016-12-18 DIAGNOSIS — Z23 Encounter for immunization: Secondary | ICD-10-CM | POA: Diagnosis not present

## 2017-02-06 DIAGNOSIS — Z1231 Encounter for screening mammogram for malignant neoplasm of breast: Secondary | ICD-10-CM | POA: Diagnosis not present

## 2017-02-07 ENCOUNTER — Other Ambulatory Visit: Payer: Self-pay

## 2017-02-07 ENCOUNTER — Encounter: Payer: Self-pay | Admitting: Family Medicine

## 2017-02-07 ENCOUNTER — Ambulatory Visit: Payer: 59 | Admitting: Family Medicine

## 2017-02-07 VITALS — BP 120/84 | HR 58 | Temp 98.1°F | Ht 62.5 in | Wt 152.8 lb

## 2017-02-07 DIAGNOSIS — M25562 Pain in left knee: Secondary | ICD-10-CM | POA: Diagnosis not present

## 2017-02-07 MED ORDER — METHYLPREDNISOLONE ACETATE 40 MG/ML IJ SUSP
80.0000 mg | Freq: Once | INTRAMUSCULAR | Status: AC
  Administered 2017-02-07: 80 mg via INTRA_ARTICULAR

## 2017-02-07 NOTE — Progress Notes (Signed)
Dr. Frederico Hamman T. Abrar Koone, MD, [facility] [place], [address] Phone: [phone] Fax: [phone]  02/07/2017  Patient: Veronica [name], MRN: [id number], DOB: 1955-09-22, 61 y.o.  Primary Physician:  Lucille Passy, MD   Chief Complaint  Patient presents with  . Knee Pain    Left   Subjective:   Sullivan Blasing is a 61 y.o. very pleasant female patient who presents with the following:  L knee - mostly medial pain. Ongoing for about 3-4 months. She does not recall any specific injury at all. She does have a known history of mild to moderate degenerative changes, tricompartmental. Primarily she has pain on the medial aspect, and also with getting in and out of a car as well as getting in and out and off of her horse. She has not had any swelling.  No significant surgical history.  L knee inj  Past Medical History, Surgical History, Social History, Family History, Problem List, Medications, and Allergies have been reviewed and updated if relevant.  Patient Active Problem List   Diagnosis Date Noted  . Acute upper respiratory infection 04/04/2016  . Anxiety and depression 04/11/2012    Past Medical History:  Diagnosis Date  . Depression   . Dry eyes     Past Surgical History:  Procedure Laterality Date  . CLOSED REDUCTION HAND FRACTURE  2002   left  . LASIK      Social History   Socioeconomic History  . Marital status: Single    Spouse name: Not on file  . Number of children: Not on file  . Years of education: Not on file  . Highest education level: Not on file  Social Needs  . Financial resource strain: Not on file  . Food insecurity - worry: Not on file  . Food insecurity - inability: Not on file  . Transportation needs - medical: Not on file  . Transportation needs - non-medical: Not on file  Occupational History  . Not on file  Tobacco Use  . Smoking status: Former Smoker    Last attempt to quit:  06/11/1988    Years since quitting: 28.6  . Smokeless tobacco: Never Used  Substance and Sexual Activity  . Alcohol use: Yes    Alcohol/week: 3.6 oz    Types: 6 Glasses of wine per week  . Drug use: No  . Sexual activity: Not on file  Other Topics Concern  . Not on file  Social History Narrative  . Not on file    Family History  Problem Relation Age of Onset  . Colon cancer Neg Hx     No Known Allergies  Medication list reviewed and updated in full in [facility].  GEN: No fevers, chills. Nontoxic. Primarily MSK c/o today. MSK: Detailed in the HPI GI: tolerating PO intake without difficulty Neuro: No numbness, parasthesias, or tingling associated. Otherwise the pertinent positives of the ROS are noted above.   Objective:   BP 120/84   Pulse (!) 58   Temp 98.1 F (36.7 C) (Oral)   Ht 5' 2.5" (1.588 m)   Wt 152 lb 12 oz (69.3 kg)   BMI 27.49 kg/m    GEN: WDWN, NAD, Non-toxic, Alert & Oriented x 3 HEENT: Atraumatic, Normocephalic.  Ears and Nose: No external deformity. EXTR: No clubbing/cyanosis/edema NEURO: Normal gait.  PSYCH: Normally interactive. Conversant. Not depressed or anxious appearing.  Calm demeanor.    Left knee: Full  extension. Flexion to 125. Excellent motion at the patella. Notable medial joint line tenderness. No lateral joint line tenderness. McMurray's is positive for pain. Flexion pinch is negative. Bounce home testing is negative. ACL, PCL, MCL, and LCL are all stable.   Radiology: No results found.  Assessment and Plan:   Acute pain of left knee - Plan: methylPREDNISolone acetate (DEPO-MEDROL) injection 80 mg  3 healthy lady with a three-month history of knee pain without clear source. Degenerative meniscal tear would be highest on differential.  Continue with quadriceps strengthening, horse riding, bike riding, and give this more time. We will do a intra-articular injection. If the patient is still symptomatic in 3 or 4 weeks,  asked her to call me, and at that point obtaining an MRI without contrast to evaluate for internal derangement would be prudent.  Follow-up: as above  Meds ordered this encounter  Medications  . methylPREDNISolone acetate (DEPO-MEDROL) injection 80 mg   Medications Discontinued During This Encounter  Medication Reason  . chlorpheniramine-HYDROcodone (TUSSIONEX PENNKINETIC ER) 10-8 MG/5ML SUER Completed Course  . hydrocortisone (ANUCORT-HC) 25 MG suppository Completed Course  . RESTASIS 0.05 % ophthalmic emulsion Completed Course   Signed,  Frederico Hamman T. Yvonnie Schinke, MD   Allergies as of 02/07/2017   No Known Allergies     Medication List        Accurate as of 02/07/17  6:06 PM. Always use your most recent med list.          buPROPion 150 MG 24 hr tablet Commonly known as:  WELLBUTRIN XL TAKE 1 TABLET EVERY MORNING   estrogen (conjugated)-medroxyprogesterone 0.625-5 MG tablet Commonly known as:  PREMPRO Take 1 tablet by mouth daily.   SUMAtriptan-naproxen 85-500 MG tablet Commonly known as:  TREXIMET Take 1 tablet by mouth every 2 (two) hours as needed for migraine.

## 2017-03-04 ENCOUNTER — Other Ambulatory Visit: Payer: Self-pay | Admitting: Family Medicine

## 2017-03-05 ENCOUNTER — Other Ambulatory Visit: Payer: Self-pay | Admitting: Family Medicine

## 2017-03-05 ENCOUNTER — Telehealth: Payer: Self-pay | Admitting: Family Medicine

## 2017-03-05 DIAGNOSIS — N6310 Unspecified lump in the right breast, unspecified quadrant: Secondary | ICD-10-CM

## 2017-03-05 NOTE — Telephone Encounter (Signed)
Please call pt to find out where she would like to have diagnostic mammogram done.

## 2017-03-05 NOTE — Telephone Encounter (Signed)
TA-It is [facility]/I have a fax coversheet prepared to fax order to Earlyne Iba in the Diagnostic Scheduling Dept/If you create the order I would be happy to print and fax it/thx dmf

## 2017-03-05 NOTE — Telephone Encounter (Signed)
Copied from [place]. Topic: Quick Communication - See Telephone Encounter >> Mar 05, 2017 10:26 AM [name] wrote: CRM for notification. See Telephone encounter for:  [name] patient had yearly mammogram and was abnormal screening, asking for an order for Right Diagnostic and right breast ultra sound.  Fax # [phone]  Patient saw Dr. Deborra Medina and then Dr. Edilia Bo.  03/05/17.

## 2017-03-05 NOTE — Telephone Encounter (Signed)
Thank you!  I have placed the orders.

## 2017-03-06 MED ORDER — BUPROPION HCL ER (XL) 150 MG PO TB24: 150.0000 mg | ORAL_TABLET | Freq: Every morning | ORAL | 0 refills | Status: DC

## 2017-03-06 NOTE — Telephone Encounter (Signed)
Orders have been faxed/thx dmf

## 2017-03-08 ENCOUNTER — Encounter: Payer: Self-pay | Admitting: Family Medicine

## 2017-03-08 DIAGNOSIS — R922 Inconclusive mammogram: Secondary | ICD-10-CM | POA: Diagnosis not present

## 2017-03-08 DIAGNOSIS — R928 Other abnormal and inconclusive findings on diagnostic imaging of breast: Secondary | ICD-10-CM | POA: Diagnosis not present

## 2017-03-15 NOTE — Telephone Encounter (Signed)
RH-No you don't have to do anything/I had Dr. Deborra Medina sign the printed copy and I faxed it over to the facility that needed it/so you're good no worries/thx dmf

## 2017-03-15 NOTE — Telephone Encounter (Signed)
Veronica [name] I'm just  making  sure pt has appointment for mammogram.  This showed up on our schedule orders for Dec 4. Do I Need to do anything with this Thanks Shirlean Mylar

## 2017-03-18 ENCOUNTER — Telehealth: Payer: Self-pay | Admitting: Family Medicine

## 2017-03-18 NOTE — Telephone Encounter (Signed)
Copied from [place] [id number]. Topic: Quick Communication - See Telephone Encounter >> Mar 18, 2017  9:50 AM [name], NT wrote: CRM for notification. See Telephone encounter for: Pt is calling to leave a message for Veronica [name] that the ultra sound is compete and the results are in and everything has been scheduled   03/18/17.

## 2017-05-06 ENCOUNTER — Other Ambulatory Visit: Payer: Self-pay | Admitting: Family Medicine

## 2017-06-20 ENCOUNTER — Encounter: Payer: Self-pay | Admitting: Primary Care

## 2017-06-20 ENCOUNTER — Ambulatory Visit: Payer: 59 | Admitting: Primary Care

## 2017-06-20 VITALS — BP 118/80 | HR 64 | Temp 97.9°F | Ht 62.5 in | Wt 156.5 lb

## 2017-06-20 DIAGNOSIS — F329 Major depressive disorder, single episode, unspecified: Secondary | ICD-10-CM | POA: Diagnosis not present

## 2017-06-20 DIAGNOSIS — N941 Unspecified dyspareunia: Secondary | ICD-10-CM | POA: Diagnosis not present

## 2017-06-20 DIAGNOSIS — G43701 Chronic migraine without aura, not intractable, with status migrainosus: Secondary | ICD-10-CM | POA: Diagnosis not present

## 2017-06-20 DIAGNOSIS — F419 Anxiety disorder, unspecified: Secondary | ICD-10-CM | POA: Diagnosis not present

## 2017-06-20 DIAGNOSIS — Z23 Encounter for immunization: Secondary | ICD-10-CM

## 2017-06-20 DIAGNOSIS — F32A Depression, unspecified: Secondary | ICD-10-CM

## 2017-06-20 MED ORDER — SUMATRIPTAN-NAPROXEN SODIUM 85-500 MG PO TABS: ORAL_TABLET | ORAL | 0 refills | Status: AC

## 2017-06-20 MED ORDER — CONJ ESTROG-MEDROXYPROGEST ACE 0.625-5 MG PO TABS: 1.0000 | ORAL_TABLET | Freq: Every day | ORAL | 1 refills | Status: DC

## 2017-06-20 MED ORDER — BUPROPION HCL ER (XL) 150 MG PO TB24: 150.0000 mg | ORAL_TABLET | Freq: Every morning | ORAL | 3 refills | Status: DC

## 2017-06-20 NOTE — Addendum Note (Signed)
Addended by: Jacqualin Combes on: 06/20/2017 11:47 AM   Modules accepted: Orders

## 2017-06-20 NOTE — Assessment & Plan Note (Signed)
Infrequent migraines, infrequent use of sumatriptan-naproxen. Refill sent to pharmacy.

## 2017-06-20 NOTE — Progress Notes (Signed)
Subjective:    Patient ID: Veronica [name], female    DOB: Apr 18, 1955, 62 y.o.   MRN: [id number]  HPI  Veronica [name] is a 62 year old female who presents today to transfer care from Dr. Deborra Medina.  1) Migraines: Mostly menstrual induced in the past, now will get one every 4 months as she is post menopausal. Typically located to the bilateral front lobes with sensitivity to smells. Currently managed on sumatriptan-naproxen 85-500 mg tablets for which she takes rarely as she'll mostly take OTC naproxen first with relief. She is needing a refill today.  2) Anxiety and Depression: Diagnosed several years ago. Currently managed on Wellbutrin XL 150 mg. Overall doing well on her medication. Denies SI/HI. She is needing refills.   3) Dyspareunia: Symptoms of painful intercourse. Currently managed on Prempro 0.625-5 mg tablets and is doing very well. She is needing refills.   Review of Systems  Constitutional: Negative for fatigue.  Respiratory: Negative for shortness of breath.   Cardiovascular: Negative for chest pain.  Genitourinary: Negative for dyspareunia.  Neurological: Negative for dizziness and headaches.       Past Medical History:  Diagnosis Date  . Depression   . Dry eyes      Social History   Socioeconomic History  . Marital status: Single    Spouse name: Not on file  . Number of children: Not on file  . Years of education: Not on file  . Highest education level: Not on file  Occupational History  . Not on file  Social Needs  . Financial resource strain: Not on file  . Food insecurity:    Worry: Not on file    Inability: Not on file  . Transportation needs:    Medical: Not on file    Non-medical: Not on file  Tobacco Use  . Smoking status: Former Smoker    Last attempt to quit: 06/11/1988    Years since quitting: 29.0  . Smokeless tobacco: Never Used  Substance and Sexual Activity  . Alcohol use: Yes    Alcohol/week: 3.6 oz    Types: 6 Glasses of wine per week  . Drug  use: No  . Sexual activity: Not on file  Lifestyle  . Physical activity:    Days per week: Not on file    Minutes per session: Not on file  . Stress: Not on file  Relationships  . Social connections:    Talks on phone: Not on file    Gets together: Not on file    Attends religious service: Not on file    Active member of club or organization: Not on file    Attends meetings of clubs or organizations: Not on file    Relationship status: Not on file  . Intimate partner violence:    Fear of current or ex partner: Not on file    Emotionally abused: Not on file    Physically abused: Not on file    Forced sexual activity: Not on file  Other Topics Concern  . Not on file  Social History Narrative   Married.   No children.   Works for [employer].   Enjoys showing and riding horses.     Past Surgical History:  Procedure Laterality Date  . CLOSED REDUCTION HAND FRACTURE  2002   left  . LASIK      Family History  Problem Relation Age of Onset  . Hypertension Mother   . Heart disease Mother   .  COPD Father   . Colon cancer Neg Hx     No Known Allergies  No current outpatient medications on file prior to visit.   No current facility-administered medications on file prior to visit.     BP 118/80   Pulse 64   Temp 97.9 F (36.6 C) (Oral)   Ht 5' 2.5" (1.588 m)   Wt 156 lb 8 oz (71 kg)   SpO2 98%   BMI 28.17 kg/m    Objective:   Physical Exam  Constitutional: She appears well-nourished.  Neck: Neck supple.  Cardiovascular: Normal rate and regular rhythm.  Pulmonary/Chest: Effort normal and breath sounds normal.  Skin: Skin is warm and dry.  Psychiatric: She has a normal mood and affect.          Assessment & Plan:

## 2017-06-20 NOTE — Assessment & Plan Note (Signed)
Managed on Prempro, doing well on current regimen. No dyspareunia. Discussed risks of estrogen replacement, she verbalized understanding and would like to continue Rx. refill sent to pharmacy.

## 2017-06-20 NOTE — Patient Instructions (Signed)
Please schedule a physical with me sometime this year. You may also schedule a lab only appointment 3-4 days prior. We will discuss your lab results in detail during your physical.  I sent refills through the mail order pharmacy.  It was a pleasure meeting you!

## 2017-06-20 NOTE — Assessment & Plan Note (Signed)
Doing well on Wellbutrin Xl 150 mg, denies SI/HI. Continue same. Refills sent to pharmacy.

## 2017-07-03 DIAGNOSIS — L02222 Furuncle of back [any part, except buttock]: Secondary | ICD-10-CM | POA: Diagnosis not present

## 2017-07-03 DIAGNOSIS — L0212 Furuncle of neck: Secondary | ICD-10-CM | POA: Diagnosis not present

## 2017-07-03 DIAGNOSIS — D225 Melanocytic nevi of trunk: Secondary | ICD-10-CM | POA: Diagnosis not present

## 2017-07-06 ENCOUNTER — Other Ambulatory Visit: Payer: Self-pay | Admitting: Family Medicine

## 2017-07-08 NOTE — Telephone Encounter (Signed)
KC-Plz see refill req/thx dmf 

## 2017-07-09 NOTE — Telephone Encounter (Signed)
Refills sent to pharmacy on 06/20/17.

## 2017-07-31 DIAGNOSIS — H5213 Myopia, bilateral: Secondary | ICD-10-CM | POA: Diagnosis not present

## 2017-08-05 ENCOUNTER — Encounter: Payer: Self-pay | Admitting: Primary Care

## 2017-08-05 ENCOUNTER — Ambulatory Visit: Payer: 59 | Admitting: Primary Care

## 2017-08-05 VITALS — BP 120/74 | HR 71 | Temp 98.1°F | Ht 62.5 in | Wt 158.2 lb

## 2017-08-05 DIAGNOSIS — J069 Acute upper respiratory infection, unspecified: Secondary | ICD-10-CM | POA: Diagnosis not present

## 2017-08-05 MED ORDER — GUAIFENESIN-CODEINE 100-10 MG/5ML PO SYRP: 5.0000 mL | ORAL_SOLUTION | Freq: Three times a day (TID) | ORAL | 0 refills | Status: DC | PRN

## 2017-08-05 NOTE — Patient Instructions (Signed)
You may take the Hycodan cough suppressant three times daily as needed for cough and rest. Caution this medication contains codeine and will make you feel drowsy.  Resume Ibuprofen or Tylenol for fevers, sore throat, muscle soreness.   Please message me Thursday or Friday this week if your symptoms do not improve.   It was a pleasure to see you today!   Upper Respiratory Infection, Adult Most upper respiratory infections (URIs) are caused by a virus. A URI affects the nose, throat, and upper air passages. The most common type of URI is often called "the common cold." Follow these instructions at home:  Take medicines only as told by your doctor.  Gargle warm saltwater or take cough drops to comfort your throat as told by your doctor.  Use a warm mist humidifier or inhale steam from a shower to increase air moisture. This may make it easier to breathe.  Drink enough fluid to keep your pee (urine) clear or pale yellow.  Eat soups and other clear broths.  Have a healthy diet.  Rest as needed.  Go back to work when your fever is gone or your doctor says it is okay. ? You may need to stay home longer to avoid giving your URI to others. ? You can also wear a face mask and wash your hands often to prevent spread of the virus.  Use your inhaler more if you have asthma.  Do not use any tobacco products, including cigarettes, chewing tobacco, or electronic cigarettes. If you need help quitting, ask your doctor. Contact a doctor if:  You are getting worse, not better.  Your symptoms are not helped by medicine.  You have chills.  You are getting more short of breath.  You have brown or red mucus.  You have yellow or brown discharge from your nose.  You have pain in your face, especially when you bend forward.  You have a fever.  You have puffy (swollen) neck glands.  You have pain while swallowing.  You have white areas in the back of your throat. Get help right away  if:  You have very bad or constant: ? Headache. ? Ear pain. ? Pain in your forehead, behind your eyes, and over your cheekbones (sinus pain). ? Chest pain.  You have long-lasting (chronic) lung disease and any of the following: ? Wheezing. ? Long-lasting cough. ? Coughing up blood. ? A change in your usual mucus.  You have a stiff neck.  You have changes in your: ? Vision. ? Hearing. ? Thinking. ? Mood. This information is not intended to replace advice given to you by your health care provider. Make sure you discuss any questions you have with your health care provider. Document Released: 09/05/2007 Document Revised: 11/20/2015 Document Reviewed: 06/24/2013 Elsevier Interactive Patient Education  2018 [employer].

## 2017-08-05 NOTE — Progress Notes (Signed)
Subjective:    Patient ID: Veronica [name], female    DOB: October 02, 1955, 62 y.o.   MRN: [id number]  HPI  Ms. Veronica [name] is a 62 year old female who presents today with a chief complaint of sore throat.   She also reports cough, mild chest congestion, low grade fevers. Her fevers are running 99's. Her cough is non productive. Her symptoms began 9-10 days ago. She started feeling better towards the end of last week, started feeling worse again this past weekend. She's been taking cough drops, OTC cough suppressant, Dayquil without improvement.   Review of Systems  Constitutional: Positive for fever.  HENT: Positive for congestion and sore throat.   Respiratory: Positive for cough. Negative for shortness of breath and wheezing.   Cardiovascular: Negative for chest pain.       Past Medical History:  Diagnosis Date  . Depression   . Dry eyes      Social History   Socioeconomic History  . Marital status: Single    Spouse name: Not on file  . Number of children: Not on file  . Years of education: Not on file  . Highest education level: Not on file  Occupational History  . Not on file  Social Needs  . Financial resource strain: Not on file  . Food insecurity:    Worry: Not on file    Inability: Not on file  . Transportation needs:    Medical: Not on file    Non-medical: Not on file  Tobacco Use  . Smoking status: Former Smoker    Last attempt to quit: 06/11/1988    Years since quitting: 29.1  . Smokeless tobacco: Never Used  Substance and Sexual Activity  . Alcohol use: Yes    Alcohol/week: 3.6 oz    Types: 6 Glasses of wine per week  . Drug use: No  . Sexual activity: Not on file  Lifestyle  . Physical activity:    Days per week: Not on file    Minutes per session: Not on file  . Stress: Not on file  Relationships  . Social connections:    Talks on phone: Not on file    Gets together: Not on file    Attends religious service: Not on file    Active member of club or  organization: Not on file    Attends meetings of clubs or organizations: Not on file    Relationship status: Not on file  . Intimate partner violence:    Fear of current or ex partner: Not on file    Emotionally abused: Not on file    Physically abused: Not on file    Forced sexual activity: Not on file  Other Topics Concern  . Not on file  Social History Narrative   Married.   No children.   Works for [employer].   Enjoys showing and riding horses.     Past Surgical History:  Procedure Laterality Date  . CLOSED REDUCTION HAND FRACTURE  2002   left  . LASIK      Family History  Problem Relation Age of Onset  . Hypertension Mother   . Heart disease Mother   . COPD Father   . Colon cancer Neg Hx     No Known Allergies  Current Outpatient Medications on File Prior to Visit  Medication Sig Dispense Refill  . buPROPion (WELLBUTRIN XL) 150 MG 24 hr tablet Take 1 tablet (150 mg total) by mouth every morning.  90 tablet 3  . estrogen, conjugated,-medroxyprogesterone (PREMPRO) 0.625-5 MG tablet Take 1 tablet by mouth daily. 84 tablet 1  . SUMAtriptan-naproxen (TREXIMET) 85-500 MG tablet Take 1 tablet by mouth at migraine onset. May repeat in 2 hours if migraine persists, do not exceed 2 tablets in 24 hours. 10 tablet 0   No current facility-administered medications on file prior to visit.     BP 120/74   Pulse 71   Temp 98.1 F (36.7 C) (Oral)   Ht 5' 2.5" (1.588 m)   Wt 158 lb 4 oz (71.8 kg)   SpO2 98%   BMI 28.48 kg/m    Objective:   Physical Exam  Constitutional: She appears well-nourished.  HENT:  Right Ear: Tympanic membrane and ear canal normal.  Left Ear: Tympanic membrane and ear canal normal.  Nose: Right sinus exhibits no maxillary sinus tenderness and no frontal sinus tenderness. Left sinus exhibits no maxillary sinus tenderness and no frontal sinus tenderness.  Mouth/Throat: Oropharynx is clear and moist.  Eyes: Conjunctivae are normal.    Neck: Neck supple.  Cardiovascular: Normal rate and regular rhythm.  Pulmonary/Chest: Effort normal and breath sounds normal. She has no wheezes. She has no rales.  Dry cough during exam  Lymphadenopathy:    She has no cervical adenopathy.  Skin: Skin is warm and dry.          Assessment & Plan:  URI:  Cough, mild congestion, sore throat x 8-10 days, felt worse this past weekend. Exam today with clear lungs, dry cough; otherwise unremarkable. Do suspect viral URI at this point and will treat with conservative measures. Would consider Zpak vs Amoxil towards the end of this week if no improvement.  Rx for Cheratussin sent to pharmacy.  She will call later this week if her symptoms do not improve.   [name], NP

## 2017-08-07 ENCOUNTER — Encounter: Payer: Self-pay | Admitting: Primary Care

## 2017-08-07 DIAGNOSIS — J069 Acute upper respiratory infection, unspecified: Secondary | ICD-10-CM

## 2017-08-07 MED ORDER — AZITHROMYCIN 250 MG PO TABS: ORAL_TABLET | ORAL | 0 refills | Status: DC

## 2017-08-12 ENCOUNTER — Ambulatory Visit: Payer: 59 | Admitting: Primary Care

## 2017-11-08 ENCOUNTER — Other Ambulatory Visit: Payer: Self-pay | Admitting: Primary Care

## 2017-11-08 DIAGNOSIS — N941 Unspecified dyspareunia: Secondary | ICD-10-CM

## 2017-11-08 NOTE — Telephone Encounter (Signed)
Noted, refill sent to pharmacy. 

## 2017-11-08 NOTE — Telephone Encounter (Signed)
Last prescribed on 06/20/2017 Last office visit on 08/05/2017

## 2018-01-25 ENCOUNTER — Other Ambulatory Visit: Payer: Self-pay | Admitting: Primary Care

## 2018-01-25 DIAGNOSIS — N941 Unspecified dyspareunia: Secondary | ICD-10-CM

## 2018-01-27 NOTE — Telephone Encounter (Signed)
Noted, refill sent to pharmacy. 

## 2018-01-27 NOTE — Telephone Encounter (Signed)
Last prescribed on 11/08/2017 Last office visit on 08/05/2017

## 2018-01-30 ENCOUNTER — Ambulatory Visit (INDEPENDENT_AMBULATORY_CARE_PROVIDER_SITE_OTHER): Payer: 59

## 2018-01-30 ENCOUNTER — Telehealth: Payer: Self-pay | Admitting: Primary Care

## 2018-01-30 DIAGNOSIS — M25569 Pain in unspecified knee: Principal | ICD-10-CM

## 2018-01-30 DIAGNOSIS — Z23 Encounter for immunization: Secondary | ICD-10-CM | POA: Diagnosis not present

## 2018-01-30 DIAGNOSIS — Z1239 Encounter for other screening for malignant neoplasm of breast: Secondary | ICD-10-CM

## 2018-01-30 DIAGNOSIS — G8929 Other chronic pain: Secondary | ICD-10-CM

## 2018-01-30 MED ORDER — MELOXICAM 15 MG PO TABS: ORAL_TABLET | ORAL | 0 refills | Status: DC

## 2018-01-30 NOTE — Telephone Encounter (Signed)
Best number [phone]  Pt wanted to know if you could write her a rx  For meloxicam for knee pain.  [facility] [address] She stated she has taken husband meloxicam and this helps her knee

## 2018-01-30 NOTE — Telephone Encounter (Signed)
It looks like Dr. Lorelei Pont has seen her in the past for acute knee pain, we've never discussed this as she established care with me in March 2019. It looks like she's schedule for an injection to her knee in December 2019.  Please notify patient that I will send in Meloxicam 15 mg tablets. Take 1 tablet by mouth once daily as needed for pain. May need to take with food.

## 2018-01-31 NOTE — Telephone Encounter (Signed)
Message left for patient to return my call.  

## 2018-02-10 NOTE — Telephone Encounter (Signed)
Spoken and notified patient of [name] comments. Patient verbalized understanding.  Patient stated that could Veronica [name] go ahead and order her mammogram to [facility] because she is about due. She is also traveling and won't be back until mid December.

## 2018-02-10 NOTE — Telephone Encounter (Signed)
Noted, order for mammogram placed. 

## 2018-02-10 NOTE — Telephone Encounter (Signed)
Pt returning call to nurse Vallarie Mare

## 2018-02-25 ENCOUNTER — Ambulatory Visit: Payer: 59 | Admitting: Family Medicine

## 2018-02-25 ENCOUNTER — Ambulatory Visit (INDEPENDENT_AMBULATORY_CARE_PROVIDER_SITE_OTHER): Payer: 59

## 2018-02-25 ENCOUNTER — Encounter: Payer: Self-pay | Admitting: Family Medicine

## 2018-02-25 VITALS — BP 138/102 | HR 63 | Temp 98.3°F | Ht 62.5 in | Wt 161.4 lb

## 2018-02-25 DIAGNOSIS — M1712 Unilateral primary osteoarthritis, left knee: Secondary | ICD-10-CM | POA: Insufficient documentation

## 2018-02-25 MED ORDER — DICLOFENAC SODIUM 2 % TD SOLN: 1.0000 "application " | Freq: Two times a day (BID) | TRANSDERMAL | 3 refills | Status: AC

## 2018-02-25 NOTE — Progress Notes (Signed)
Veronica [name] - 62 y.o. female MRN [id number]  Date of birth: 01/31/1956  SUBJECTIVE:  Including CC & ROS.  Chief Complaint  Patient presents with  . Knee Pain    left knee pain , pt would like knee injection    Veronica [name] is a 62 y.o. female that is presenting with acute on chronic left knee pain.  The pain started a few weeks ago.  Pain is occurring on the medial joint line is localized to the knee.  Pain is intermittent.  The pain has caused her to stay up at night.  She does take meloxicam with some improvement of the pain.  Denies any inciting event or injury.  The pain feels similar to what it has before.  She denies any history of surgery on the knee.  Denies any locking or giving way.  Has not tried a brace previously.  Independent review of the left knee x-ray from 2018 shows mild medial joint space narrowing   Review of Systems  Constitutional: Negative for fever.  HENT: Negative for congestion.   Respiratory: Negative for cough.   Cardiovascular: Negative for chest pain.  Gastrointestinal: Negative for abdominal distention.  Musculoskeletal: Positive for arthralgias.  Skin: Negative for color change.  Neurological: Negative for weakness.  Hematological: Negative for adenopathy.  Psychiatric/Behavioral: Negative for agitation.    HISTORY: Past Medical, Surgical, Social, and Family History Reviewed & Updated per EMR.   Pertinent Historical Findings include:  Past Medical History:  Diagnosis Date  . Depression   . Dry eyes     Past Surgical History:  Procedure Laterality Date  . CLOSED REDUCTION HAND FRACTURE  2002   left  . LASIK      No Known Allergies  Family History  Problem Relation Age of Onset  . Hypertension Mother   . Heart disease Mother   . COPD Father   . Colon cancer Neg Hx      Social History   Socioeconomic History  . Marital status: Single    Spouse name: Not on file  . Number of children: Not on file  . Years of education: Not on file    . Highest education level: Not on file  Occupational History  . Not on file  Social Needs  . Financial resource strain: Not on file  . Food insecurity:    Worry: Not on file    Inability: Not on file  . Transportation needs:    Medical: Not on file    Non-medical: Not on file  Tobacco Use  . Smoking status: Former Smoker    Last attempt to quit: 06/11/1988    Years since quitting: 29.7  . Smokeless tobacco: Never Used  Substance and Sexual Activity  . Alcohol use: Yes    Alcohol/week: 6.0 standard drinks    Types: 6 Glasses of wine per week  . Drug use: No  . Sexual activity: Not on file  Lifestyle  . Physical activity:    Days per week: Not on file    Minutes per session: Not on file  . Stress: Not on file  Relationships  . Social connections:    Talks on phone: Not on file    Gets together: Not on file    Attends religious service: Not on file    Active member of club or organization: Not on file    Attends meetings of clubs or organizations: Not on file    Relationship status: Not on file  . Intimate  partner violence:    Fear of current or ex partner: Not on file    Emotionally abused: Not on file    Physically abused: Not on file    Forced sexual activity: Not on file  Other Topics Concern  . Not on file  Social History Narrative   Married.   No children.   Works for [employer].   Enjoys showing and riding horses.      PHYSICAL EXAM:  VS: BP (!) 138/102 (BP Location: Right Arm, Patient Position: Sitting, Cuff Size: Normal)   Pulse 63   Temp 98.3 F (36.8 C) (Oral)   Ht 5' 2.5" (1.588 m)   Wt 161 lb 6.4 oz (73.2 kg)   SpO2 98%   BMI 29.05 kg/m  Physical Exam Gen: NAD, alert, cooperative with exam, well-appearing ENT: normal lips, normal nasal mucosa,  Eye: normal EOM, normal conjunctiva and lids CV:  no edema, +2 pedal pulses   Resp: no accessory muscle use, non-labored,  Skin: no rashes, no areas of induration  Neuro: normal tone,  normal sensation to touch Psych:  normal insight, alert and oriented MSK:  Left knee: No obvious effusion. Tenderness palpation the medial joint line. Normal range of motion. Instability with valgus and varus stress testing. Positive McMurray's test. Neurovascularly intact   Aspiration/Injection Procedure Note Veronica [name] 1955/10/12  Procedure: Injection Indications: Left knee pain  Procedure Details Consent: Risks of procedure as well as the alternatives and risks of each were explained to the (patient/caregiver).  Consent for procedure obtained. Time Out: Verified patient identification, verified procedure, site/side was marked, verified correct patient position, special equipment/implants available, medications/allergies/relevent history reviewed, required imaging and test results available.  Performed.  The area was cleaned with iodine and alcohol swabs.    The left knee superior lateral suprapatellar pouch was injected using 1 cc's of 40 mg Kenalog and 4 cc's of 0.25% bupivacaine with a 22 1 1/2" needle.  Ultrasound was used. Images were obtained in  Long views showing the injection.    A sterile dressing was applied.  Patient did tolerate procedure well.       ASSESSMENT & PLAN:   Primary osteoarthritis of left knee Pain likely associated with degenerative changes of the joint line and has degenerative meniscal changes as well.  Appreciated outpouching of the medial meniscus on ultrasound and joint space narrowing. -Injection today. -Pennsaid. -Counseled on home exercise therapy and supportive care. -Would endorse a custom medial unloader due to her thigh to calf ratio -If no improvement consider imaging and physical therapy

## 2018-02-25 NOTE — Patient Instructions (Signed)
Nice to meet you  Please try ice. 20 minutes at a time for 3-4 times daily  Please try the exercises  Please use the pennsaid  Please let me know if you would like to try the custom brace  Please see me back in 4 weeks if no better.

## 2018-02-25 NOTE — Assessment & Plan Note (Signed)
Pain likely associated with degenerative changes of the joint line and has degenerative meniscal changes as well.  Appreciated outpouching of the medial meniscus on ultrasound and joint space narrowing. -Injection today. -Pennsaid. -Counseled on home exercise therapy and supportive care. -Would endorse a custom medial unloader due to her thigh to calf ratio -If no improvement consider imaging and physical therapy

## 2018-03-04 ENCOUNTER — Ambulatory Visit: Payer: 59 | Admitting: Family Medicine

## 2018-03-04 ENCOUNTER — Encounter: Payer: Self-pay | Admitting: Family Medicine

## 2018-03-04 VITALS — BP 134/78 | HR 74 | Temp 98.4°F | Resp 10 | Ht 62.0 in | Wt 160.8 lb

## 2018-03-04 DIAGNOSIS — J101 Influenza due to other identified influenza virus with other respiratory manifestations: Secondary | ICD-10-CM

## 2018-03-04 DIAGNOSIS — R059 Cough, unspecified: Secondary | ICD-10-CM

## 2018-03-04 DIAGNOSIS — R05 Cough: Secondary | ICD-10-CM | POA: Diagnosis not present

## 2018-03-04 LAB — POCT INFLUENZA A/B
INFLUENZA A, POC: POSITIVE — AB
Influenza B, POC: NEGATIVE

## 2018-03-04 MED ORDER — HYDROCOD POLST-CPM POLST ER 10-8 MG/5ML PO SUER: 5.0000 mL | Freq: Two times a day (BID) | ORAL | 0 refills | Status: DC | PRN

## 2018-03-04 MED ORDER — OSELTAMIVIR PHOSPHATE 75 MG PO CAPS: 75.0000 mg | ORAL_CAPSULE | Freq: Two times a day (BID) | ORAL | Status: DC

## 2018-03-04 MED ORDER — OSELTAMIVIR PHOSPHATE 75 MG PO CAPS: 75.0000 mg | ORAL_CAPSULE | Freq: Two times a day (BID) | ORAL | 0 refills | Status: AC

## 2018-03-04 MED ORDER — HYDROCOD POLST-CPM POLST ER 10-8 MG/5ML PO SUER: 5.0000 mL | Freq: Two times a day (BID) | ORAL | Status: DC | PRN

## 2018-03-04 NOTE — Patient Instructions (Addendum)
You have the Flu  Take Tamiflu 2 times a day for 5 days  Antibiotics are not need for a viral infection but the following will help:   1. Drink plenty of fluids 2. Get lots of rest  Sinus Congestion 1) Neti Pot (Saline rinse) -- 2 times day -- if tolerated 2) Flonase (Store Brand ok) - once daily 3) Over the counter congestion medications  Cough 1) Cough drops can be helpful 2) Nyquil (or nighttime cough medication) 3) Honey is proven to be one of the best cough medications   Sore Throat 1) Honey as above, cough drops 2) Ibuprofen or Aleve can be helpful 3) Salt water Gargles

## 2018-03-04 NOTE — Telephone Encounter (Signed)
Will you please have patient scheduled for an acute visit? Thanks!

## 2018-03-04 NOTE — Progress Notes (Signed)
Subjective:     Veronica [name] is a 62 y.o. female presenting for Fever (Started not feeling well on Sunday 03/02/18 evening.Fever this morning 100.2. Body aches, cough-productive clear phlegm, sore throat, drainage, ear pain. Patient has taking Tylenol, Nyquel and Sudafed for her symptoms.)     URI   This is a new problem. The current episode started in the past 7 days. The problem has been gradually worsening. The maximum temperature recorded prior to her arrival was 100.4 - 100.9 F. The fever has been present for less than 1 day. Associated symptoms include coughing, ear pain, headaches, nausea, rhinorrhea, sinus pain and a sore throat. Pertinent negatives include no abdominal pain, chest pain, congestion, diarrhea, plugged ear sensation, swollen glands, vomiting or wheezing. She has tried decongestant, acetaminophen and antihistamine for the symptoms. The treatment provided mild relief.   No sick contact - though recent flight  Flu shot - yes   Review of Systems  HENT: Positive for ear pain, rhinorrhea, sinus pain and sore throat. Negative for congestion.   Respiratory: Positive for cough. Negative for wheezing.   Cardiovascular: Negative for chest pain.  Gastrointestinal: Positive for nausea. Negative for abdominal pain, diarrhea and vomiting.  Musculoskeletal: Positive for myalgias.  Neurological: Positive for headaches.     Social History   Tobacco Use  Smoking Status Former Smoker  . Last attempt to quit: 06/11/1988  . Years since quitting: 29.7  Smokeless Tobacco Never Used        Objective:    BP Readings from Last 3 Encounters:  03/04/18 134/78  02/25/18 (!) 138/102  08/05/17 120/74   Wt Readings from Last 3 Encounters:  03/04/18 160 lb 12 oz (72.9 kg)  02/25/18 161 lb 6.4 oz (73.2 kg)  08/05/17 158 lb 4 oz (71.8 kg)    BP 134/78   Pulse 74   Temp 98.4 F (36.9 C)   Resp 10   Ht 5\' 2"  (1.575 m)   Wt 160 lb 12 oz (72.9 kg)   SpO2 97%   BMI 29.40 kg/m      Physical Exam  Constitutional: She appears well-developed and well-nourished. No distress.  HENT:  Head: Normocephalic and atraumatic.  Right Ear: Tympanic membrane and ear canal normal.  Left Ear: Tympanic membrane and ear canal normal.  Nose: Mucosal edema and rhinorrhea present. Right sinus exhibits maxillary sinus tenderness and frontal sinus tenderness. Left sinus exhibits maxillary sinus tenderness and frontal sinus tenderness.  Mouth/Throat: Uvula is midline and mucous membranes are normal. Posterior oropharyngeal erythema present. No oropharyngeal exudate. Tonsils are 0 on the right. Tonsils are 0 on the left.  Eyes: Conjunctivae and EOM are normal. No scleral icterus.  Neck: Neck supple.  Cardiovascular: Normal rate, regular rhythm and normal heart sounds.  No murmur heard. Pulmonary/Chest: Effort normal and breath sounds normal. No respiratory distress. She has no wheezes. She has no rales.  Lymphadenopathy:    She has no cervical adenopathy.  Neurological: She is alert.  Skin: Skin is warm and dry. Capillary refill takes less than 2 seconds. She is not diaphoretic.  Psychiatric: She has a normal mood and affect.          Assessment & Plan:   Problem List Items Addressed This Visit    None    Visit Diagnoses    Influenza A    -  Primary   Relevant Medications   oseltamivir (TAMIFLU) 75 MG capsule   Cough  Medication for cough sent in Recommended that her husband contact his provider Symptomatic care   Return if symptoms worsen or fail to improve.  Lesleigh Noe, MD

## 2018-03-04 NOTE — Addendum Note (Signed)
Addended by: Kris Mouton on: 03/04/2018 02:48 PM   Modules accepted: Orders

## 2018-03-05 ENCOUNTER — Ambulatory Visit: Payer: 59 | Admitting: Family Medicine

## 2018-03-18 ENCOUNTER — Ambulatory Visit
Admission: RE | Admit: 2018-03-18 | Discharge: 2018-03-18 | Disposition: A | Discharge status: Home / Self Care | Payer: 59 | Source: Ambulatory Visit | Attending: Primary Care | Admitting: Primary Care

## 2018-03-18 DIAGNOSIS — Z1239 Encounter for other screening for malignant neoplasm of breast: Secondary | ICD-10-CM

## 2018-05-03 ENCOUNTER — Encounter: Payer: Self-pay | Admitting: Family Medicine

## 2018-05-11 ENCOUNTER — Other Ambulatory Visit: Payer: Self-pay | Admitting: Primary Care

## 2018-05-11 DIAGNOSIS — N941 Unspecified dyspareunia: Secondary | ICD-10-CM

## 2018-05-13 ENCOUNTER — Ambulatory Visit (INDEPENDENT_AMBULATORY_CARE_PROVIDER_SITE_OTHER): Payer: 59

## 2018-05-13 ENCOUNTER — Telehealth: Payer: Self-pay | Admitting: Family Medicine

## 2018-05-13 ENCOUNTER — Encounter: Payer: Self-pay | Admitting: Family Medicine

## 2018-05-13 ENCOUNTER — Ambulatory Visit: Payer: 59 | Admitting: Family Medicine

## 2018-05-13 VITALS — BP 140/90 | HR 57 | Temp 97.9°F | Ht 62.0 in | Wt 162.4 lb

## 2018-05-13 DIAGNOSIS — M1712 Unilateral primary osteoarthritis, left knee: Secondary | ICD-10-CM

## 2018-05-13 DIAGNOSIS — M25569 Pain in unspecified knee: Secondary | ICD-10-CM

## 2018-05-13 DIAGNOSIS — G8929 Other chronic pain: Secondary | ICD-10-CM | POA: Diagnosis not present

## 2018-05-13 MED ORDER — IBUPROFEN-FAMOTIDINE 800-26.6 MG PO TABS: 1.0000 | ORAL_TABLET | Freq: Three times a day (TID) | ORAL | 3 refills | Status: DC

## 2018-05-13 MED ORDER — MELOXICAM 15 MG PO TABS: ORAL_TABLET | ORAL | 2 refills | Status: DC

## 2018-05-13 NOTE — Patient Instructions (Addendum)
Good to see you  I will call you with the results from today  Please try the exercises

## 2018-05-13 NOTE — Telephone Encounter (Signed)
Left VM for patient. If she calls back please have her speak with a nurse/CMA and inform that her xray shows mild degenerative changes. I think it would be beneficial to try the gel injections if she would like. The [facility] can report results to patient.   If any questions then please take the best time and phone number to call and I will try to call her back.   Rosemarie Ax, MD [facility] 05/13/2018, 4:00 PM

## 2018-05-13 NOTE — Telephone Encounter (Signed)
Dr. Raeford Razor please advise pt states she wants to try gel injections

## 2018-05-13 NOTE — Assessment & Plan Note (Signed)
Acute on chronic worsening of her left knee pain that is likely related to degenerative changes.  Likely having degenerative meniscal pain contributing as well. -X-ray today. -Provided Duexis that she can try and refilled Mobic -Counseled on home exercise therapy and supportive care. -If no improvement consider physical therapy or further imaging.

## 2018-05-13 NOTE — Progress Notes (Signed)
Veronica [name] - 63 y.o. female MRN [id number]  Date of birth: November 02, 1955  SUBJECTIVE:  Including CC & ROS.  Chief Complaint  Patient presents with  . Pain    left knee pain/ injections didnt help/ need rx refill on mobic    Veronica [name] is a 63 y.o. female that is acute on chronic worsening left knee pain.  The pain is over the medial joint line.  She had mild improvement with the injection last November.  She had a few days with no pain.  She experiences pain on a regular basis.  The pain is moderate to severe.  The pain is localized to the knee and sharp and throbbing in nature.  She is tried medications with little improvement.  She seems to catch her left foot when she is walking and this causes her to have knee pain.  She seems to not be able to dorsiflex like she normally should.  Has been using the medial unloader brace with mild improvement..   Review of Systems  Constitutional: Negative for fever.  HENT: Negative for congestion.   Respiratory: Negative for cough.   Cardiovascular: Negative for chest pain.  Gastrointestinal: Negative for abdominal pain.  Musculoskeletal: Positive for joint swelling.  Skin: Negative for color change.  Neurological: Negative for weakness.  Hematological: Negative for adenopathy.  Psychiatric/Behavioral: Negative for agitation.    HISTORY: Past Medical, Surgical, Social, and Family History Reviewed & Updated per EMR.   Pertinent Historical Findings include:  Past Medical History:  Diagnosis Date  . Depression   . Dry eyes     Past Surgical History:  Procedure Laterality Date  . CLOSED REDUCTION HAND FRACTURE  2002   left  . LASIK      No Known Allergies  Family History  Problem Relation Age of Onset  . Hypertension Mother   . Heart disease Mother   . COPD Father   . Colon cancer Neg Hx      Social History   Socioeconomic History  . Marital status: Single    Spouse name: Not on file  . Number of children: Not on file  . Years  of education: Not on file  . Highest education level: Not on file  Occupational History  . Not on file  Social Needs  . Financial resource strain: Not on file  . Food insecurity:    Worry: Not on file    Inability: Not on file  . Transportation needs:    Medical: Not on file    Non-medical: Not on file  Tobacco Use  . Smoking status: Former Smoker    Last attempt to quit: 06/11/1988    Years since quitting: 29.9  . Smokeless tobacco: Never Used  Substance and Sexual Activity  . Alcohol use: Yes    Alcohol/week: 6.0 standard drinks    Types: 6 Glasses of wine per week  . Drug use: No  . Sexual activity: Not on file  Lifestyle  . Physical activity:    Days per week: Not on file    Minutes per session: Not on file  . Stress: Not on file  Relationships  . Social connections:    Talks on phone: Not on file    Gets together: Not on file    Attends religious service: Not on file    Active member of club or organization: Not on file    Attends meetings of clubs or organizations: Not on file    Relationship status: Not on  file  . Intimate partner violence:    Fear of current or ex partner: Not on file    Emotionally abused: Not on file    Physically abused: Not on file    Forced sexual activity: Not on file  Other Topics Concern  . Not on file  Social History Narrative   Married.   No children.   Works for [employer].   Enjoys showing and riding horses.      PHYSICAL EXAM:  VS: BP 140/90   Pulse (!) 57   Temp 97.9 F (36.6 C) (Oral)   Ht 5\' 2"  (1.575 m)   Wt 162 lb 6.4 oz (73.7 kg)   SpO2 99%   BMI 29.70 kg/m  Physical Exam Gen: NAD, alert, cooperative with exam, well-appearing ENT: normal lips, normal nasal mucosa,  Eye: normal EOM, normal conjunctiva and lids CV:  no edema, +2 pedal pulses   Resp: no accessory muscle use, non-labored,   Skin: no rashes, no areas of induration  Neuro: normal tone, normal sensation to touch Psych:  normal  insight, alert and oriented MSK:  Left knee: No obvious effusion. Some tenderness palpation of the medial joint line. Normal range of motion. Normal strength resistance. Some instability with one leg standing when compared to the right. Neurovascularly intact     ASSESSMENT & PLAN:   Primary osteoarthritis of left knee Acute on chronic worsening of her left knee pain that is likely related to degenerative changes.  Likely having degenerative meniscal pain contributing as well. -X-ray today. -Provided Duexis that she can try and refilled Mobic -Counseled on home exercise therapy and supportive care. -If no improvement consider physical therapy or further imaging.

## 2018-05-13 NOTE — Telephone Encounter (Signed)
Pt called and she was read the note of Dr Raeford Razor 05/13/18.  Pt states she would be in favor of trying the gel injections. Please advise.

## 2018-05-14 ENCOUNTER — Other Ambulatory Visit: Payer: Self-pay | Admitting: Primary Care

## 2018-05-14 ENCOUNTER — Encounter: Payer: Self-pay | Admitting: Family Medicine

## 2018-05-14 DIAGNOSIS — F329 Major depressive disorder, single episode, unspecified: Secondary | ICD-10-CM

## 2018-05-14 DIAGNOSIS — F32A Depression, unspecified: Secondary | ICD-10-CM

## 2018-05-14 DIAGNOSIS — F419 Anxiety disorder, unspecified: Principal | ICD-10-CM

## 2018-05-19 ENCOUNTER — Ambulatory Visit: Payer: Self-pay | Admitting: Podiatry

## 2018-05-28 ENCOUNTER — Other Ambulatory Visit: Payer: Self-pay | Admitting: *Deleted

## 2018-06-02 ENCOUNTER — Ambulatory Visit: Payer: 59 | Admitting: Podiatry

## 2018-06-02 ENCOUNTER — Encounter: Payer: Self-pay | Admitting: Podiatry

## 2018-06-02 VITALS — BP 120/73 | HR 70 | Temp 97.5°F

## 2018-06-02 DIAGNOSIS — L600 Ingrowing nail: Secondary | ICD-10-CM

## 2018-06-02 MED ORDER — NEOMYCIN-POLYMYXIN-HC 1 % OT SOLN: OTIC | 1 refills | Status: DC

## 2018-06-02 NOTE — Patient Instructions (Signed)

## 2018-06-02 NOTE — Progress Notes (Signed)
Subjective:  Patient ID: Veronica [name], female    DOB: January 12, 1956,  MRN: [id number] HPI Chief Complaint  Patient presents with  . Nail Problem    patient presents today for ingrown toenail right hallux medial border x 3 months.  She reports its very tender to touch now and painful to wear shoes.  She has been soaking in Epson salt and using Neosporin to treat ingrown.    63 y.o. female presents with the above complaint.   ROS: Denies fever chills nausea vomiting muscle aches pains calf pain back pain chest pain shortness of breath.  Past Medical History:  Diagnosis Date  . Depression   . Dry eyes    Past Surgical History:  Procedure Laterality Date  . CLOSED REDUCTION HAND FRACTURE  2002   left  . LASIK      Current Outpatient Medications:  .  buPROPion (WELLBUTRIN XL) 150 MG 24 hr tablet, Take 1 tablet (150 mg total) by mouth every morning. NEED APPOINTMENT FOR ANY MORE REFILLS, Disp: 90 tablet, Rfl: 0 .  Diclofenac Sodium (PENNSAID) 2 % SOLN, Place 1 application onto the skin 2 (two) times daily., Disp: 1 Bottle, Rfl: 3 .  estrogen, conjugated,-medroxyprogesterone (PREMPRO) 0.625-5 MG tablet, Take 1 tablet by mouth daily. DUE FOR A FOLLOW UP APPOINTMENT PLEASE CALL TO SCHEDULE, Disp: 84 tablet, Rfl: 0 .  meloxicam (MOBIC) 15 MG tablet, Take 1 tablet by mouth once daily as needed for pain., Disp: 30 tablet, Rfl: 2 .  NEOMYCIN-POLYMYXIN-HYDROCORTISONE (CORTISPORIN) 1 % SOLN OTIC solution, Apply 1-2 drops to toe BID after soaking, Disp: 10 mL, Rfl: 1 .  SUMAtriptan-naproxen (TREXIMET) 85-500 MG tablet, Take 1 tablet by mouth at migraine onset. May repeat in 2 hours if migraine persists, do not exceed 2 tablets in 24 hours., Disp: 10 tablet, Rfl: 0  No Known Allergies Review of Systems Objective:   Vitals:   06/02/18 0828  BP: 120/73  Pulse: 70  Temp: (!) 97.5 F (36.4 C)    General: Well developed, nourished, in no acute distress, alert and oriented x3   Dermatological:  Skin is warm, dry and supple bilateral. Nails x 10 are well maintained; remaining integument appears unremarkable at this time. There are no open sores, no preulcerative lesions, no rash or signs of infection present.  Sharp incurvated nail margin along the tibial border of the hallux right with erythema proximally.  No purulence no malodor.  Vascular: Dorsalis Pedis artery and Posterior Tibial artery pedal pulses are 2/4 bilateral with immedate capillary fill time. Pedal hair growth present. No varicosities and no lower extremity edema present bilateral.   Neruologic: Grossly intact via light touch bilateral. Vibratory intact via tuning fork bilateral. Protective threshold with Semmes Wienstein monofilament intact to all pedal sites bilateral. Patellar and Achilles deep tendon reflexes 2+ bilateral. No Babinski or clonus noted bilateral.   Musculoskeletal: No gross boney pedal deformities bilateral. No pain, crepitus, or limitation noted with foot and ankle range of motion bilateral. Muscular strength 5/5 in all groups tested bilateral.  Gait: Unassisted, Nonantalgic.    Radiographs:  None taken  Assessment & Plan:   Assessment: Ingrown toenail tibial border hallux right  Plan: Discussed etiology pathology and surgical therapies at this point time performed a chemical matricectomy to the tibial border of the hallux right.  Tolerated procedure well without complications.  She was given both oral and written home-going instructions for the care and soaking of the toe as well as a prescription for Cortisporin Otic  to be applied twice daily after soaking.  Remember to ask how her horse show went.     Max T. [place], [place]

## 2018-06-05 ENCOUNTER — Ambulatory Visit (INDEPENDENT_AMBULATORY_CARE_PROVIDER_SITE_OTHER): Payer: 59

## 2018-06-05 ENCOUNTER — Ambulatory Visit: Payer: 59 | Admitting: Family Medicine

## 2018-06-05 DIAGNOSIS — M1712 Unilateral primary osteoarthritis, left knee: Secondary | ICD-10-CM | POA: Diagnosis not present

## 2018-06-05 NOTE — Progress Notes (Signed)
Erna Brossard - 63 y.o. female MRN [id number]  Date of birth: 1955-06-28  SUBJECTIVE:  Including CC & ROS.  No chief complaint on file.   Shenicka Montero is a 63 y.o. female that is  Following up with left knee pain. Here for her first Gelsyn-3 injection. .   Review of Systems  HISTORY: Past Medical, Surgical, Social, and Family History Reviewed & Updated per EMR.   Pertinent Historical Findings include:  Past Medical History:  Diagnosis Date  . Depression   . Dry eyes     Past Surgical History:  Procedure Laterality Date  . CLOSED REDUCTION HAND FRACTURE  2002   left  . LASIK      No Known Allergies  Family History  Problem Relation Age of Onset  . Hypertension Mother   . Heart disease Mother   . COPD Father   . Colon cancer Neg Hx      Social History   Socioeconomic History  . Marital status: Single    Spouse name: Not on file  . Number of children: Not on file  . Years of education: Not on file  . Highest education level: Not on file  Occupational History  . Not on file  Social Needs  . Financial resource strain: Not on file  . Food insecurity:    Worry: Not on file    Inability: Not on file  . Transportation needs:    Medical: Not on file    Non-medical: Not on file  Tobacco Use  . Smoking status: Former Smoker    Last attempt to quit: 06/11/1988    Years since quitting: 30.0  . Smokeless tobacco: Never Used  Substance and Sexual Activity  . Alcohol use: Yes    Alcohol/week: 6.0 standard drinks    Types: 6 Glasses of wine per week  . Drug use: No  . Sexual activity: Not on file  Lifestyle  . Physical activity:    Days per week: Not on file    Minutes per session: Not on file  . Stress: Not on file  Relationships  . Social connections:    Talks on phone: Not on file    Gets together: Not on file    Attends religious service: Not on file    Active member of club or organization: Not on file    Attends meetings of clubs or organizations: Not on  file    Relationship status: Not on file  . Intimate partner violence:    Fear of current or ex partner: Not on file    Emotionally abused: Not on file    Physically abused: Not on file    Forced sexual activity: Not on file  Other Topics Concern  . Not on file  Social History Narrative   Married.   No children.   Works for [employer].   Enjoys showing and riding horses.      PHYSICAL EXAM:  VS: There were no vitals taken for this visit. Physical Exam Gen: NAD, alert, cooperative with exam, well-appearing   Aspiration/Injection Procedure Note Jezel Basto [birth year]  Procedure: Injection Indications: left  Procedure Details Consent: Risks of procedure as well as the alternatives and risks of each were explained to the (patient/caregiver).  Consent for procedure obtained. Time Out: Verified patient identification, verified procedure, site/side was marked, verified correct patient position, special equipment/implants available, medications/allergies/relevent history reviewed, required imaging and test results available.  Performed.  The area was cleaned with iodine and  alcohol swabs.    The left knee superior lateral suprapatellar pouch was injected using 4 cc's of 1% lidocaine with a 22 1 1/2" needle.  The syringe was switched and a 16.8 mg/2 mL of Gelsyn-3 was injected. Ultrasound was used. Images were obtained in  Long views showing the injection.    A sterile dressing was applied.  Patient did tolerate procedure well.      ASSESSMENT & PLAN:   Primary osteoarthritis of left knee Presenting for her first injection of Gelsyn 3 - Follow up in one week

## 2018-06-05 NOTE — Patient Instructions (Addendum)
Good to see you  Please continue to ice  Please see [place] back in one week.

## 2018-06-05 NOTE — Assessment & Plan Note (Signed)
Presenting for her first injection of Gelsyn 3 - Follow up in one week

## 2018-06-16 ENCOUNTER — Ambulatory Visit: Payer: 59 | Admitting: Podiatry

## 2018-06-16 ENCOUNTER — Encounter: Payer: Self-pay | Admitting: Podiatry

## 2018-06-16 ENCOUNTER — Ambulatory Visit: Payer: 59 | Admitting: Family Medicine

## 2018-06-16 ENCOUNTER — Ambulatory Visit (INDEPENDENT_AMBULATORY_CARE_PROVIDER_SITE_OTHER): Payer: 59

## 2018-06-16 ENCOUNTER — Other Ambulatory Visit: Payer: Self-pay

## 2018-06-16 DIAGNOSIS — Z9889 Other specified postprocedural states: Secondary | ICD-10-CM

## 2018-06-16 DIAGNOSIS — M1712 Unilateral primary osteoarthritis, left knee: Secondary | ICD-10-CM

## 2018-06-16 DIAGNOSIS — L600 Ingrowing nail: Secondary | ICD-10-CM

## 2018-06-16 NOTE — Patient Instructions (Signed)
Good to see you  Please follow up in one week.

## 2018-06-16 NOTE — Progress Notes (Signed)
Veronica [name] - 63 y.o. female MRN [id number]  Date of birth: [birth year]  SUBJECTIVE:  Including CC & ROS.  No chief complaint on file.   Veronica [name] is a 63 y.o. female that is following up for her second injection of gelsyn-3.    Review of Systems  HISTORY: Past Medical, Surgical, Social, and Family History Reviewed & Updated per EMR.   Pertinent Historical Findings include:  Past Medical History:  Diagnosis Date  . Depression   . Dry eyes     Past Surgical History:  Procedure Laterality Date  . CLOSED REDUCTION HAND FRACTURE  2002   left  . LASIK      No Known Allergies  Family History  Problem Relation Age of Onset  . Hypertension Mother   . Heart disease Mother   . COPD Father   . Colon cancer Neg Hx      Social History   Socioeconomic History  . Marital status: Single    Spouse name: Not on file  . Number of children: Not on file  . Years of education: Not on file  . Highest education level: Not on file  Occupational History  . Not on file  Social Needs  . Financial resource strain: Not on file  . Food insecurity:    Worry: Not on file    Inability: Not on file  . Transportation needs:    Medical: Not on file    Non-medical: Not on file  Tobacco Use  . Smoking status: Former Smoker    Last attempt to quit: 06/11/1988    Years since quitting: 30.0  . Smokeless tobacco: Never Used  Substance and Sexual Activity  . Alcohol use: Yes    Alcohol/week: 6.0 standard drinks    Types: 6 Glasses of wine per week  . Drug use: No  . Sexual activity: Not on file  Lifestyle  . Physical activity:    Days per week: Not on file    Minutes per session: Not on file  . Stress: Not on file  Relationships  . Social connections:    Talks on phone: Not on file    Gets together: Not on file    Attends religious service: Not on file    Active member of club or organization: Not on file    Attends meetings of clubs or organizations: Not on file    Relationship  status: Not on file  . Intimate partner violence:    Fear of current or ex partner: Not on file    Emotionally abused: Not on file    Physically abused: Not on file    Forced sexual activity: Not on file  Other Topics Concern  . Not on file  Social History Narrative   Married.   No children.   Works for [employer].   Enjoys showing and riding horses.      PHYSICAL EXAM:  VS: There were no vitals taken for this visit. Physical Exam Gen: NAD, alert, cooperative with exam, well-appearing   Aspiration/Injection Procedure Note Hilary Milks 1956/02/16  Procedure: Injection Indications: left knee pain  Procedure Details Consent: Risks of procedure as well as the alternatives and risks of each were explained to the (patient/caregiver).  Consent for procedure obtained. Time Out: Verified patient identification, verified procedure, site/side was marked, verified correct patient position, special equipment/implants available, medications/allergies/relevent history reviewed, required imaging and test results available.  Performed.  The area was cleaned with iodine and alcohol swabs.  The left knee superior lateral suprapatellar pouch was injected using 4 cc's of 1% lidocaine with a 22 1 1/2" needle.  The syringe was switched and a mixture of 16.8 mg/2 mL of Gelsyn-3 was injected. Ultrasound was used. Images were obtained in  Long views showing the injection.    A sterile dressing was applied.  Patient did tolerate procedure well.      ASSESSMENT & PLAN:   Primary osteoarthritis of left knee Completed second injection Gelsyn-3.  - f/u in one week to completed the series.

## 2018-06-16 NOTE — Progress Notes (Signed)
She presents today for follow-up nail procedure hallux right.  She states that she has not soaked it today but she is been soaking it twice a day every day and putting the medication on it.  She states is doing just fine has no problems with it whatsoever.  Objective: Vital signs are stable alert and oriented x3.  Pulses are palpable.  There is no erythema edema cellulitis drainage or odor.  Tibial border of the hallux right appears to be healing very nicely.  Assessment: Well-healed surgical toe tibial border hallux right.  Plan: Encouraged her to soak every other day until completely healed.  Follow-up with me as needed.

## 2018-06-16 NOTE — Assessment & Plan Note (Signed)
Completed second injection Gelsyn-3.  - f/u in one week to completed the series.

## 2018-06-23 ENCOUNTER — Ambulatory Visit: Payer: 59 | Admitting: Family Medicine

## 2018-06-23 ENCOUNTER — Other Ambulatory Visit: Payer: Self-pay

## 2018-06-23 ENCOUNTER — Ambulatory Visit (INDEPENDENT_AMBULATORY_CARE_PROVIDER_SITE_OTHER): Payer: 59

## 2018-06-23 DIAGNOSIS — M1712 Unilateral primary osteoarthritis, left knee: Secondary | ICD-10-CM | POA: Diagnosis not present

## 2018-06-23 NOTE — Assessment & Plan Note (Signed)
Has completed the series of Gelsyn-3.  - can f/u in 4 weeks

## 2018-06-23 NOTE — Progress Notes (Signed)
Veronica [name] - 63 y.o. female MRN [id number]  Date of birth: 15-Feb-1956  SUBJECTIVE:  Including CC & ROS.  No chief complaint on file.   Veronica [name] is a 63 y.o. female that is following up for left knee pain. Here to finish his Gelsym-3 injections.    Review of Systems  HISTORY: Past Medical, Surgical, Social, and Family History Reviewed & Updated per EMR.   Pertinent Historical Findings include:  Past Medical History:  Diagnosis Date  . Depression   . Dry eyes     Past Surgical History:  Procedure Laterality Date  . CLOSED REDUCTION HAND FRACTURE  2002   left  . LASIK      No Known Allergies  Family History  Problem Relation Age of Onset  . Hypertension Mother   . Heart disease Mother   . COPD Father   . Colon cancer Neg Hx      Social History   Socioeconomic History  . Marital status: Single    Spouse name: Not on file  . Number of children: Not on file  . Years of education: Not on file  . Highest education level: Not on file  Occupational History  . Not on file  Social Needs  . Financial resource strain: Not on file  . Food insecurity:    Worry: Not on file    Inability: Not on file  . Transportation needs:    Medical: Not on file    Non-medical: Not on file  Tobacco Use  . Smoking status: Former Smoker    Last attempt to quit: 06/11/1988    Years since quitting: 30.0  . Smokeless tobacco: Never Used  Substance and Sexual Activity  . Alcohol use: Yes    Alcohol/week: 6.0 standard drinks    Types: 6 Glasses of wine per week  . Drug use: No  . Sexual activity: Not on file  Lifestyle  . Physical activity:    Days per week: Not on file    Minutes per session: Not on file  . Stress: Not on file  Relationships  . Social connections:    Talks on phone: Not on file    Gets together: Not on file    Attends religious service: Not on file    Active member of club or organization: Not on file    Attends meetings of clubs or organizations: Not on file     Relationship status: Not on file  . Intimate partner violence:    Fear of current or ex partner: Not on file    Emotionally abused: Not on file    Physically abused: Not on file    Forced sexual activity: Not on file  Other Topics Concern  . Not on file  Social History Narrative   Married.   No children.   Works for [employer].   Enjoys showing and riding horses.      PHYSICAL EXAM:  VS: There were no vitals taken for this visit. Physical Exam Gen: NAD, alert, cooperative with exam, well-appearing   Aspiration/Injection Procedure Note Linnet Bottari [birth year]  Procedure: Injection Indications: left knee pain   Procedure Details Consent: Risks of procedure as well as the alternatives and risks of each were explained to the (patient/caregiver).  Consent for procedure obtained. Time Out: Verified patient identification, verified procedure, site/side was marked, verified correct patient position, special equipment/implants available, medications/allergies/relevent history reviewed, required imaging and test results available.  Performed.  The area was cleaned with  iodine and alcohol swabs.    The left knee superior lateral suprapatellar pouch was injected using 4 cc's of 1% lidocaine with a 22 1 1/2" needle.  The syringe was switched and a mixture of 1.8 mg/9mL gelsyn~3 was injected. Ultrasound was used. Images were obtained in  Long views showing the injection.     A sterile dressing was applied.  Patient did tolerate procedure well.      ASSESSMENT & PLAN:   Primary osteoarthritis of left knee Has completed the series of Gelsyn-3.  - can f/u in 4 weeks

## 2018-07-11 ENCOUNTER — Other Ambulatory Visit: Payer: Self-pay | Admitting: Primary Care

## 2018-07-11 DIAGNOSIS — F329 Major depressive disorder, single episode, unspecified: Secondary | ICD-10-CM

## 2018-07-11 DIAGNOSIS — F419 Anxiety disorder, unspecified: Principal | ICD-10-CM

## 2018-07-11 DIAGNOSIS — F32A Depression, unspecified: Secondary | ICD-10-CM

## 2018-08-03 ENCOUNTER — Other Ambulatory Visit: Payer: Self-pay | Admitting: Primary Care

## 2018-08-03 DIAGNOSIS — F329 Major depressive disorder, single episode, unspecified: Secondary | ICD-10-CM

## 2018-08-03 DIAGNOSIS — F419 Anxiety disorder, unspecified: Secondary | ICD-10-CM

## 2018-08-03 DIAGNOSIS — F32A Depression, unspecified: Secondary | ICD-10-CM

## 2018-08-03 DIAGNOSIS — N941 Unspecified dyspareunia: Secondary | ICD-10-CM

## 2018-08-07 NOTE — Telephone Encounter (Signed)
Patient overdue for follow up with me, she will need this for further refills. Please get her scheduled virtually for Friday May 8th or Monday May 11th. How many tablets does she have left?

## 2018-08-07 NOTE — Telephone Encounter (Signed)
Last prescribed on 05/12/2018. Last office visit on 03/04/2018 with Dr Einar Pheasant for acute. No future appointment

## 2018-08-07 NOTE — Telephone Encounter (Signed)
Message left for patient to return my call.  

## 2018-08-08 MED ORDER — BUPROPION HCL ER (XL) 150 MG PO TB24: 150.0000 mg | ORAL_TABLET | Freq: Every morning | ORAL | 0 refills | Status: DC

## 2018-08-08 NOTE — Telephone Encounter (Signed)
Per MyChart message:   Cristi Loron to [name], NP            12:43 PM   I can call for a virtual follow up. I need the WellbutrinXl refilled quicker than the Prem pro.  Thanks     Patient has schedule the appointment on 08/19/2018

## 2018-08-08 NOTE — Telephone Encounter (Signed)
Noted. Refilled Wellbutrin.  We will see her as scheduled.

## 2018-08-08 NOTE — Telephone Encounter (Signed)
I have reply to patient on MyChart

## 2018-08-08 NOTE — Telephone Encounter (Signed)
Copied from [place] [id number]. Topic: General - Other >> Aug 07, 2018  4:43 PM Yvette Rack wrote: Reason for CRM: Pt stated she was returning the call to the office. Pt requests call back. CB# [phone]

## 2018-08-13 ENCOUNTER — Other Ambulatory Visit: Payer: Self-pay | Admitting: Family Medicine

## 2018-08-13 DIAGNOSIS — G8929 Other chronic pain: Secondary | ICD-10-CM

## 2018-08-19 ENCOUNTER — Encounter: Payer: Self-pay | Admitting: Primary Care

## 2018-08-19 ENCOUNTER — Ambulatory Visit (INDEPENDENT_AMBULATORY_CARE_PROVIDER_SITE_OTHER): Payer: 59 | Admitting: Primary Care

## 2018-08-19 DIAGNOSIS — M549 Dorsalgia, unspecified: Secondary | ICD-10-CM

## 2018-08-19 DIAGNOSIS — M1712 Unilateral primary osteoarthritis, left knee: Secondary | ICD-10-CM

## 2018-08-19 DIAGNOSIS — M542 Cervicalgia: Secondary | ICD-10-CM

## 2018-08-19 DIAGNOSIS — G8929 Other chronic pain: Secondary | ICD-10-CM

## 2018-08-19 DIAGNOSIS — G43701 Chronic migraine without aura, not intractable, with status migrainosus: Secondary | ICD-10-CM

## 2018-08-19 DIAGNOSIS — N941 Unspecified dyspareunia: Secondary | ICD-10-CM

## 2018-08-19 MED ORDER — CONJ ESTROG-MEDROXYPROGEST ACE 0.625-5 MG PO TABS: 1.0000 | ORAL_TABLET | Freq: Every day | ORAL | 3 refills | Status: DC

## 2018-08-19 NOTE — Patient Instructions (Signed)
Please schedule a physical with me within the next 3 months. You may also schedule a lab only appointment 3-4 days prior. We will discuss your lab results in detail during your physical.  I attached a letter to your my chart account regarding your massages.   It was a pleasure to see you today! Allie Bossier, NP-C

## 2018-08-19 NOTE — Assessment & Plan Note (Signed)
Chronic and typically treated with routine massage therapy with great improvement. Agree to write a letter for medical necessity of massage therapy given return in pain. We most certainly would rather treat her with non pharmacological therapy rather than medications.

## 2018-08-19 NOTE — Assessment & Plan Note (Signed)
Overall infrequent, recent migraine due to lack of routine massages to her neck, also with some stress at home. Using Treximet infrequently.

## 2018-08-19 NOTE — Assessment & Plan Note (Signed)
Doing well on Prempro. Discussed risks of estrogen/progesterone therapy at her age which includes breast cancer and CAD, she verbalized understanding and would like to continue with the RX.  Mammogram is UTD from December 2019. Repeat lipids due this Summer.  Refill sent to pharmacy.

## 2018-08-19 NOTE — Progress Notes (Signed)
Subjective:    Patient ID: Veronica [name], female    DOB: Aug 03, 1955, 63 y.o.   MRN: [id number]  HPI  Virtual Visit via Video Note  I connected with Veronica [name] on 08/19/18 at  9:00 AM EDT by a video enabled telemedicine application and verified that I am speaking with the correct person using two identifiers.  Location: Patient: Home Provider: Office   I discussed the limitations of evaluation and management by telemedicine and the availability of in person appointments. The patient expressed understanding and agreed to proceed.  History of Present Illness:  Ms. Veronica [name] is a 63 year old female who presents today for medication refill and follow up. She is also requesting a letter allowing her to go to her massage therapist.   1) Anxiety and Depression: Currently managed on bupropion XL 150 mg. Overall doing well. Denies SI/HI.  2) Dyspareunia: Currently managed on Prempro 0.625-5 mg for which she's been taking for years. She has no dyspareunia since taking Prempro and is needing a refill. Last mammogram was in December 2019. She is due for CPE with labs including lipid panel and plans on scheduling for that soon.  3) Primary Osteoarthritis: Chronic, to the left knee. Currently following with sports medicine and is managed on diclofenac solution and Meloxicam 15 mg. Also with chronic neck and upper back pain for years. She will treat this with massage therapy and has been getting massages every 2 weeks for the last 10 years. Since Covid-19 outbreak (early March) her massage therapist has been shut down. Since she's not been going to her massage therapist she's had daily neck and back pain, also with one migraine.  She'd like to return to seeing her massage therapist but is needing a letter of medical necessity.   4) Migraines: Currently managed on sumatriptan-naproxen for which she takes infrequently. Her last migraine was three days ago, thinks it was triggered by her chronic neck pain and some  stress at home. For the most part, migraines are infrequent.    Observations/Objective:  Alert and oriented. Appears well, not sickly. No distress. Speaking in complete sentences.   Assessment and Plan:  See problem based charting.  Follow Up Instructions:  Please schedule a physical with me within the next 3 months. You may also schedule a lab only appointment 3-4 days prior. We will discuss your lab results in detail during your physical.  I attached a letter to your my chart account regarding your massages.   It was a pleasure to see you today! Allie Bossier, NP-C    I discussed the assessment and treatment plan with the patient. The patient was provided an opportunity to ask questions and all were answered. The patient agreed with the plan and demonstrated an understanding of the instructions.   The patient was advised to call back or seek an in-person evaluation if the symptoms worsen or if the condition fails to improve as anticipated.     [name], NP    Review of Systems  Respiratory: Negative for shortness of breath.   Cardiovascular: Negative for chest pain.  Genitourinary: Negative for dyspareunia.  Musculoskeletal: Positive for arthralgias, back pain and neck pain.  Neurological: Positive for headaches. Negative for dizziness.  Psychiatric/Behavioral: Negative for suicidal ideas. The patient is not nervous/anxious.        Past Medical History:  Diagnosis Date  . Depression   . Dry eyes      Social History   Socioeconomic History  .  Marital status: Married    Spouse name: Not on file  . Number of children: Not on file  . Years of education: Not on file  . Highest education level: Not on file  Occupational History  . Not on file  Social Needs  . Financial resource strain: Not on file  . Food insecurity:    Worry: Not on file    Inability: Not on file  . Transportation needs:    Medical: Not on file    Non-medical: Not on file   Tobacco Use  . Smoking status: Former Smoker    Last attempt to quit: 06/11/1988    Years since quitting: 30.2  . Smokeless tobacco: Never Used  Substance and Sexual Activity  . Alcohol use: Yes    Alcohol/week: 6.0 standard drinks    Types: 6 Glasses of wine per week  . Drug use: No  . Sexual activity: Not on file  Lifestyle  . Physical activity:    Days per week: Not on file    Minutes per session: Not on file  . Stress: Not on file  Relationships  . Social connections:    Talks on phone: Not on file    Gets together: Not on file    Attends religious service: Not on file    Active member of club or organization: Not on file    Attends meetings of clubs or organizations: Not on file    Relationship status: Not on file  . Intimate partner violence:    Fear of current or ex partner: Not on file    Emotionally abused: Not on file    Physically abused: Not on file    Forced sexual activity: Not on file  Other Topics Concern  . Not on file  Social History Narrative   Married.   No children.   Works for [employer].   Enjoys showing and riding horses.     Past Surgical History:  Procedure Laterality Date  . CLOSED REDUCTION HAND FRACTURE  2002   left  . LASIK      Family History  Problem Relation Age of Onset  . Hypertension Mother   . Heart disease Mother   . COPD Father   . Colon cancer Neg Hx     No Known Allergies  Current Outpatient Medications on File Prior to Visit  Medication Sig Dispense Refill  . buPROPion (WELLBUTRIN XL) 150 MG 24 hr tablet Take 1 tablet (150 mg total) by mouth every morning. NEED APPOINTMENT FOR ANY MORE REFILLS 90 tablet 0  . Diclofenac Sodium (PENNSAID) 2 % SOLN Place 1 application onto the skin 2 (two) times daily. 1 Bottle 3  . estrogen, conjugated,-medroxyprogesterone (PREMPRO) 0.625-5 MG tablet Take 1 tablet by mouth daily. DUE FOR A FOLLOW UP APPOINTMENT PLEASE CALL TO SCHEDULE 84 tablet 0  . meloxicam (MOBIC) 15  MG tablet TAKE 1 TABLET BY MOUTH EVERY DAY AS NEEDED 30 tablet 1  . NEOMYCIN-POLYMYXIN-HYDROCORTISONE (CORTISPORIN) 1 % SOLN OTIC solution Apply 1-2 drops to toe BID after soaking 10 mL 1  . SUMAtriptan-naproxen (TREXIMET) 85-500 MG tablet Take 1 tablet by mouth at migraine onset. May repeat in 2 hours if migraine persists, do not exceed 2 tablets in 24 hours. 10 tablet 0   No current facility-administered medications on file prior to visit.     BP 120/70   Temp 97.7 F (36.5 C) (Oral)   Wt 155 lb (70.3 kg)   BMI 28.35 kg/m  Objective:   Physical Exam  Constitutional: She is oriented to person, place, and time. She appears well-nourished.  Neck: Neck supple.  Respiratory: Effort normal.  Neurological: She is alert and oriented to person, place, and time.  Skin: Skin is dry.  Psychiatric: She has a normal mood and affect.           Assessment & Plan:

## 2018-08-19 NOTE — Assessment & Plan Note (Signed)
Following with Sports Medicine. Continue current regimen.

## 2018-08-25 ENCOUNTER — Other Ambulatory Visit: Payer: Self-pay | Admitting: Primary Care

## 2018-08-25 DIAGNOSIS — F32A Depression, unspecified: Secondary | ICD-10-CM

## 2018-08-25 DIAGNOSIS — F329 Major depressive disorder, single episode, unspecified: Secondary | ICD-10-CM

## 2018-08-25 DIAGNOSIS — F419 Anxiety disorder, unspecified: Secondary | ICD-10-CM

## 2018-09-29 ENCOUNTER — Other Ambulatory Visit: Payer: Self-pay

## 2018-09-29 ENCOUNTER — Telehealth: Payer: Self-pay | Admitting: *Deleted

## 2018-09-29 DIAGNOSIS — Z20822 Contact with and (suspected) exposure to covid-19: Secondary | ICD-10-CM

## 2018-09-29 NOTE — Telephone Encounter (Signed)
Pt scheduled for covid testing today @ [facility] @ 12:15. Instructions given and order placed.

## 2018-09-29 NOTE — Telephone Encounter (Signed)
-----   Message from [name], NP sent at 09/29/2018 10:40 AM EDT ----- Regarding: Covid-19 testing Good morning!  This patient was exposed to Covid-19 recently. No symptoms. Please evaluate for testing.  Thanks! Allie Bossier, NP-C

## 2018-10-02 ENCOUNTER — Ambulatory Visit: Payer: Self-pay | Admitting: Primary Care

## 2018-10-02 LAB — NOVEL CORONAVIRUS, NAA: SARS-CoV-2, NAA: NOT DETECTED

## 2018-10-02 NOTE — Telephone Encounter (Signed)
I had a COVID-19 test done on Monday and was wondering if my results were bac yet. I saw she was active on Mychart.   "I checked and my results were not there".   I let her know her results would post to her Mychart account soon after they are resulted and to keep a check on Mychart.   She would get her results quicker via Mychart than waiting for someone to call her.   I also let her know it was taking up to 5 days or possibly more due to the high volume of tests being process.  She verbalized understanding and will keep checking her Mychart account.  Reason for Disposition . Health Information question, no triage required and triager able to answer question    Requesting COVID-19 results.   They are not ready yet.  Answer Assessment - Initial Assessment Questions 1. REASON FOR CALL or QUESTION: "What is your reason for calling today?" or "How can I best help you?" or "What question do you have that I can help answer?"     I was wondering if my COVID-19 test results were in.   I was tested on Monday.  Results not ready yet.   They will post to your Mychart account.  Protocols used: INFORMATION ONLY CALL-A-AH

## 2018-10-09 ENCOUNTER — Other Ambulatory Visit: Payer: Self-pay | Admitting: Family Medicine

## 2018-10-09 DIAGNOSIS — G8929 Other chronic pain: Secondary | ICD-10-CM

## 2018-10-09 DIAGNOSIS — M25569 Pain in unspecified knee: Secondary | ICD-10-CM

## 2019-01-06 ENCOUNTER — Other Ambulatory Visit: Payer: Self-pay

## 2019-01-06 ENCOUNTER — Ambulatory Visit (INDEPENDENT_AMBULATORY_CARE_PROVIDER_SITE_OTHER): Payer: 59

## 2019-01-06 DIAGNOSIS — Z23 Encounter for immunization: Secondary | ICD-10-CM

## 2019-02-02 ENCOUNTER — Other Ambulatory Visit: Payer: Self-pay | Admitting: Primary Care

## 2019-02-02 DIAGNOSIS — Z1231 Encounter for screening mammogram for malignant neoplasm of breast: Secondary | ICD-10-CM

## 2019-02-18 ENCOUNTER — Encounter: Payer: Self-pay | Admitting: Family Medicine

## 2019-02-19 ENCOUNTER — Other Ambulatory Visit: Payer: Self-pay | Admitting: Family Medicine

## 2019-02-19 MED ORDER — IBUPROFEN-FAMOTIDINE 800-26.6 MG PO TABS: 1.0000 | ORAL_TABLET | Freq: Three times a day (TID) | ORAL | 3 refills | Status: DC

## 2019-03-19 ENCOUNTER — Encounter: Payer: Self-pay | Admitting: Family Medicine

## 2019-03-19 ENCOUNTER — Ambulatory Visit (INDEPENDENT_AMBULATORY_CARE_PROVIDER_SITE_OTHER): Payer: 59 | Admitting: Family Medicine

## 2019-03-19 DIAGNOSIS — M999 Biomechanical lesion, unspecified: Secondary | ICD-10-CM

## 2019-03-19 DIAGNOSIS — M542 Cervicalgia: Secondary | ICD-10-CM | POA: Diagnosis not present

## 2019-03-19 DIAGNOSIS — M549 Dorsalgia, unspecified: Secondary | ICD-10-CM

## 2019-03-19 DIAGNOSIS — G8929 Other chronic pain: Secondary | ICD-10-CM

## 2019-03-19 NOTE — Assessment & Plan Note (Signed)
Chronic neck and back pain.  Discussed with patient in great length about icing regimen, home exercise, what activities to doing which was to avoid.  Patient is to increase activity as tolerated.  Patient will follow up with me again 4 to 8 weeks.  Did respond very well to osteopathic manipulation.

## 2019-03-19 NOTE — Patient Instructions (Addendum)
  [address] [place], [place] [address] Phone [phone]  Good to see you.  Ice 20 minutes 2 times daily. Usually after activity and before bed. Exercises 3 times a week.  Hands within perpherial vision 2 tennis balls in a tube sock  Tart cherry extract 1200mg  at night Vitamin D 2000 IU daily  See me again in 4 weeks

## 2019-03-19 NOTE — Assessment & Plan Note (Signed)
Decision today to treat with OMT was based on Physical Exam  After verbal consent patient was treated with HVLA, ME, FPR techniques in cervical, thoracic, rib lumbar and sacral areas  Patient tolerated the procedure well with improvement in symptoms  Patient given exercises, stretches and lifestyle modifications  See medications in patient instructions if given  Patient will follow up in 4-8 weeks 

## 2019-03-19 NOTE — Progress Notes (Signed)
[name] [place], [place] [address] Phone: [phone] Subjective:   I Veronica [name] am serving as a Education administrator for Dr. Hulan Saas.  This visit occurred during the SARS-CoV-2 public health emergency.  Safety protocols were in place, including screening questions prior to the visit, additional usage of staff PPE, and extensive cleaning of exam room while observing appropriate contact time as indicated for disinfecting solutions.    CC: Neck pain follow-up   [id number]  Veronica [name] is a 63 y.o. female coming in with complaint of neck pain. Loss of ROM looking to the left. Believes she needs an adjustment.   Onset- 1 month  Location- left   Character- achy  Aggravating factors-  Reliving factors-  Therapies tried- ice, heat, topical, oral  Severity- 6/10 at its worse     Past Medical History:  Diagnosis Date  . Depression   . Dry eyes    Past Surgical History:  Procedure Laterality Date  . CLOSED REDUCTION HAND FRACTURE  2002   left  . LASIK     Social History   Socioeconomic History  . Marital status: Married    Spouse name: Not on file  . Number of children: Not on file  . Years of education: Not on file  . Highest education level: Not on file  Occupational History  . Not on file  Tobacco Use  . Smoking status: Former Smoker    Quit date: 06/11/1988    Years since quitting: 30.7  . Smokeless tobacco: Never Used  Substance and Sexual Activity  . Alcohol use: Yes    Alcohol/week: 6.0 standard drinks    Types: 6 Glasses of wine per week  . Drug use: No  . Sexual activity: Not on file  Other Topics Concern  . Not on file  Social History Narrative   Married.   No children.   Works for [employer].   Enjoys showing and riding horses.    Social Determinants of Health   Financial Resource Strain:   . Difficulty of Paying Living Expenses: Not on file  Food Insecurity:   . Worried About Paediatric nurse in the Last Year: Not on file  . Ran Out of Food in the Last Year: Not on file  Transportation Needs:   . Lack of Transportation (Medical): Not on file  . Lack of Transportation (Non-Medical): Not on file  Physical Activity:   . Days of Exercise per Week: Not on file  . Minutes of Exercise per Session: Not on file  Stress:   . Feeling of Stress : Not on file  Social Connections:   . Frequency of Communication with Friends and Family: Not on file  . Frequency of Social Gatherings with Friends and Family: Not on file  . Attends Religious Services: Not on file  . Active Member of Clubs or Organizations: Not on file  . Attends Archivist Meetings: Not on file  . Marital Status: Not on file   No Known Allergies Family History  Problem Relation Age of Onset  . Hypertension Mother   . Heart disease Mother   . COPD Father   . Colon cancer Neg Hx     Current Outpatient Medications (Endocrine & Metabolic):  .  estrogen, conjugated,-medroxyprogesterone (PREMPRO) 0.625-5 MG tablet, Take 1 tablet by mouth daily.    Current Outpatient Medications (Analgesics):  [name]  Ibuprofen-Famotidine 800-26.6 MG TABS, Take 1 tablet by mouth  3 (three) times daily. .  meloxicam (MOBIC) 15 MG tablet, TAKE 1 TABLET BY MOUTH EVERY DAY AS NEEDED .  SUMAtriptan-naproxen (TREXIMET) 85-500 MG tablet, Take 1 tablet by mouth at migraine onset. May repeat in 2 hours if migraine persists, do not exceed 2 tablets in 24 hours.   Current Outpatient Medications (Other):  [name]  buPROPion (WELLBUTRIN XL) 150 MG 24 hr tablet, Take 1 tablet (150 mg total) by mouth every morning. .  Diclofenac Sodium (PENNSAID) 2 % SOLN, Place 1 application onto the skin 2 (two) times daily.    Past medical history, social, surgical and family history all reviewed in electronic medical record.  No pertanent information unless stated regarding to the chief complaint.   Review of Systems:  No headache, visual changes,  nausea, vomiting, diarrhea, constipation, dizziness, abdominal pain, skin rash, fevers, chills, night swechest pain, shortness of breath, mood changes.  Positive muscle aches  Objective  Blood pressure 140/84, pulse 66, height 5\' 2"  (1.575 m), weight 153 lb (69.4 kg), SpO2 98 %.    General: No apparent distress alert and oriented x3 mood and affect normal, dressed appropriately.  HEENT: Pupils equal, extraocular movements intact  Respiratory: Patient's speak in full sentences and does not appear short of breath  Cardiovascular: No lower extremity edema, non tender, no erythema  Skin: Warm dry intact with no signs of infection or rash on extremities or on axial skeleton.  Abdomen: Soft nontender  Neuro: Cranial nerves II through XII are intact, neurovascularly intact in all extremities with 2+ DTRs and 2+ pulses.  Lymph: No lymphadenopathy of posterior or anterior cervical chain or axillae bilaterally.  Gait normal with good balance and coordination.  MSK:  Non tender with full range of motion and good stability and symmetric strength and tone of shoulders, elbows, wrist, hip, knee and ankles bilaterally.  Neck: Inspection mild loss of lordosis. No palpable stepoffs. Negative Spurling's maneuver. Full neck range of motion Grip strength and sensation normal in bilateral hands Strength good C4 to T1 distribution No sensory change to C4 to T1 Negative Hoffman sign bilaterally Reflexes normal Mild tightness more in the cervical thoracic spine secondary to musculature and mild trigger points noted  Back exam does show some mild loss of lordosis on the right side with mild positive FABER test.  5 out of 5 strength in lower extremities, deep tendon reflexes intact  Osteopathic findings  C3 flexed rotated and side bent right C4 flexed rotated and side bent left C7 flexed rotated and side bent right T3 extended rotated and side bent right inhaled third rib      Impression and  Recommendations:     This case required medical decision making of moderate complexity. The above documentation has been reviewed and is accurate and complete [name], DO       Note: This dictation was prepared with Dragon dictation along with smaller phrase technology. Any transcriptional errors that result from this process are unintentional.

## 2019-03-20 ENCOUNTER — Ambulatory Visit
Admission: RE | Admit: 2019-03-20 | Discharge: 2019-03-20 | Disposition: A | Discharge status: Home / Self Care | Payer: 59 | Source: Ambulatory Visit | Attending: Primary Care | Admitting: Primary Care

## 2019-03-20 ENCOUNTER — Other Ambulatory Visit: Payer: Self-pay

## 2019-03-20 DIAGNOSIS — Z1231 Encounter for screening mammogram for malignant neoplasm of breast: Secondary | ICD-10-CM | POA: Insufficient documentation

## 2019-04-14 ENCOUNTER — Encounter: Payer: Self-pay | Admitting: Family Medicine

## 2019-04-14 ENCOUNTER — Other Ambulatory Visit: Payer: Self-pay

## 2019-04-14 ENCOUNTER — Ambulatory Visit: Payer: 59 | Admitting: Family Medicine

## 2019-04-14 VITALS — BP 114/80 | Ht 62.0 in | Wt 153.0 lb

## 2019-04-14 DIAGNOSIS — M542 Cervicalgia: Secondary | ICD-10-CM

## 2019-04-14 DIAGNOSIS — M549 Dorsalgia, unspecified: Secondary | ICD-10-CM

## 2019-04-14 DIAGNOSIS — M999 Biomechanical lesion, unspecified: Secondary | ICD-10-CM | POA: Diagnosis not present

## 2019-04-14 DIAGNOSIS — G8929 Other chronic pain: Secondary | ICD-10-CM | POA: Diagnosis not present

## 2019-04-14 MED ORDER — PREDNISONE 50 MG PO TABS: ORAL_TABLET | ORAL | 0 refills | Status: DC

## 2019-04-14 NOTE — Assessment & Plan Note (Signed)
Decision today to treat with OMT was based on Physical Exam  After verbal consent patient was treated with HVLA, ME, FPR techniques in cervical, thoracic, rib areas  Patient tolerated the procedure well with improvement in symptoms  Patient given exercises, stretches and lifestyle modifications  See medications in patient instructions if given  Patient will follow up in 6 weeks

## 2019-04-14 NOTE — Assessment & Plan Note (Signed)
More improvement with range of motion today.  Patient did do very well with the osteopathic manipulation.  We discussed icing regimen and home exercise, which activities to do which wants to avoid.  Patient is to increase activity slowly.  No significant changes otherwise.  Follow-up again in 4 to 8 weeks

## 2019-04-14 NOTE — Progress Notes (Signed)
[facility] [place] Phone: [phone] Subjective:   Veronica [name], am serving as a scribe for Dr. Hulan Saas. This visit occurred during the SARS-CoV-2 public health emergency.  Safety protocols were in place, including screening questions prior to the visit, additional usage of staff PPE, and extensive cleaning of exam room while observing appropriate contact time as indicated for disinfecting solutions.     CC: Neck pain and neck pain follow-up  [id number]  Veronica [name] is a 64 y.o. female coming in with complaint of neck and back pain. Last seen on 03/19/2019 for OMT. Patient states that she is having pain left side cervical spine pain. Pain is unchanged since last visit and is constant throughout her day.  Patient states after the manipulation did feel better for some time but then started worsening discomfort again.     Past Medical History:  Diagnosis Date  . Depression   . Dry eyes    Past Surgical History:  Procedure Laterality Date  . CLOSED REDUCTION HAND FRACTURE  2002   left  . LASIK     Social History   Socioeconomic History  . Marital status: Married    Spouse name: Not on file  . Number of children: Not on file  . Years of education: Not on file  . Highest education level: Not on file  Occupational History  . Not on file  Tobacco Use  . Smoking status: Former Smoker    Quit date: 06/11/1988    Years since quitting: 30.8  . Smokeless tobacco: Never Used  Substance and Sexual Activity  . Alcohol use: Yes    Alcohol/week: 6.0 standard drinks    Types: 6 Glasses of wine per week  . Drug use: [name]  . Sexual activity: Not on file  Other Topics Concern  . Not on file  Social History Narrative   Married.   [name] children.   Works for [employer].   Enjoys showing and riding horses.    Social Determinants of Health   Financial Resource Strain:   . Difficulty of Paying Living  Expenses: Not on file  Food Insecurity:   . Worried About Charity fundraiser in the Last Year: Not on file  . Ran Out of Food in the Last Year: Not on file  Transportation Needs:   . Lack of Transportation (Medical): Not on file  . Lack of Transportation (Non-Medical): Not on file  Physical Activity:   . Days of Exercise per Week: Not on file  . Minutes of Exercise per Session: Not on file  Stress:   . Feeling of Stress : Not on file  Social Connections:   . Frequency of Communication with Friends and Family: Not on file  . Frequency of Social Gatherings with Friends and Family: Not on file  . Attends Religious Services: Not on file  . Active Member of Clubs or Organizations: Not on file  . Attends Archivist Meetings: Not on file  . Marital Status: Not on file   [name] Family History  Problem Relation Age of Onset  . Hypertension Mother   . Heart disease Mother   . COPD Father   . Colon cancer Neg Hx     Current Outpatient Medications (Endocrine & Metabolic):  .  estrogen, conjugated,-medroxyprogesterone (PREMPRO) 0.625-5 MG tablet, Take 1 tablet by mouth daily. .  predniSONE (DELTASONE) 50 MG tablet, Take one tablet daily  for the next 5 days.    Current Outpatient Medications (Analgesics):  [name]  Ibuprofen-Famotidine 800-26.6 MG TABS, Take 1 tablet by mouth 3 (three) times daily. .  meloxicam (MOBIC) 15 MG tablet, TAKE 1 TABLET BY MOUTH EVERY DAY AS NEEDED .  SUMAtriptan-naproxen (TREXIMET) 85-500 MG tablet, Take 1 tablet by mouth at migraine onset. May repeat in 2 hours if migraine persists, do not exceed 2 tablets in 24 hours.   Current Outpatient Medications (Other):  [name]  buPROPion (WELLBUTRIN XL) 150 MG 24 hr tablet, Take 1 tablet (150 mg total) by mouth every morning. .  Diclofenac Sodium (PENNSAID) 2 % SOLN, Place 1 application onto the skin 2 (two) times daily.    Past medical history, social, surgical and family history all reviewed in  electronic medical record.  [name]   Review of Systems:  [name], visual changes, nausea, vomiting, diarrhea, constipation, dizziness, abdominal pain, skin rash, fevers, chills, night sweats, weight loss, swollen lymph nodes, body aches, joint swelling, muscle aches, chest pain, shortness of breath, mood changes.   Objective  Blood pressure 114/80, height 5\' 2"  (1.575 m), weight 153 lb (69.4 kg). Systems examined below as of    General: [name], dressed appropriately.  HEENT: Pupils equal, extraocular movements intact  Respiratory: Patient's speak in full sentences and does not appear short of breath  Cardiovascular: [name], non tender, [name]  Skin: Warm dry intact with [name] signs of infection or rash on extremities or on axial skeleton.  Abdomen: Soft nontender  Neuro: Cranial nerves II through XII are intact, neurovascularly intact in all extremities with 2+ DTRs and 2+ pulses.  Lymph: [name] lymphadenopathy of posterior or anterior cervical chain or axillae bilaterally.  Gait normal with good balance and coordination.  MSK:  Non tender with full range of motion and good stability and symmetric strength and tone of shoulders, elbows, wrist, hip, knee and ankles bilaterally.  Neck exam does have some mild loss of lordosis.  Patient does have some limited range of motion at cervical spine mostly rotated to the left and sidebending to the left.  Patient has negative Spurling's though.  Tightness in the parascapular region as well.  Osteopathic findings C2 flexed rotated and side bent right C4 flexed rotated and side bent left T3 extended rotated and side bent left with inhaled   Impression and Recommendations:     This case required medical decision making of moderate complexity. The above documentation has been reviewed and is accurate and  complete Veronica Pulley, DO       Note: This dictation was prepared with Dragon dictation along with smaller phrase technology. Any transcriptional errors that result from this process are unintentional.

## 2019-04-14 NOTE — Patient Instructions (Signed)
Prenisone for 5 days Tried manipulation  Continue vitamins See me in 5 weeks

## 2019-04-27 ENCOUNTER — Other Ambulatory Visit: Payer: Self-pay | Admitting: Primary Care

## 2019-04-27 DIAGNOSIS — F329 Major depressive disorder, single episode, unspecified: Secondary | ICD-10-CM

## 2019-04-27 DIAGNOSIS — F419 Anxiety disorder, unspecified: Secondary | ICD-10-CM

## 2019-04-27 DIAGNOSIS — F32A Depression, unspecified: Secondary | ICD-10-CM

## 2019-05-19 ENCOUNTER — Ambulatory Visit: Payer: 59 | Admitting: Family Medicine

## 2019-05-19 ENCOUNTER — Encounter: Payer: Self-pay | Admitting: Family Medicine

## 2019-05-19 ENCOUNTER — Other Ambulatory Visit: Payer: Self-pay

## 2019-05-19 VITALS — BP 130/76 | HR 65 | Ht 62.0 in | Wt 153.0 lb

## 2019-05-19 DIAGNOSIS — M999 Biomechanical lesion, unspecified: Secondary | ICD-10-CM

## 2019-05-19 DIAGNOSIS — G8929 Other chronic pain: Secondary | ICD-10-CM | POA: Diagnosis not present

## 2019-05-19 DIAGNOSIS — M542 Cervicalgia: Secondary | ICD-10-CM

## 2019-05-19 DIAGNOSIS — M549 Dorsalgia, unspecified: Secondary | ICD-10-CM | POA: Diagnosis not present

## 2019-05-19 NOTE — Progress Notes (Signed)
[facility] [address] [place] Phone: [phone] Subjective:   I [name] am serving as a Education administrator for Dr. Hulan Saas.  This visit occurred during the SARS-CoV-2 public health emergency.  Safety protocols were in place, including screening questions prior to the visit, additional usage of staff PPE, and extensive cleaning of exam room while observing appropriate contact time as indicated for disinfecting solutions.   I'm seeing this patient by the request  of:  [name], NP  CC: Neck pain and headache follow-up  [id number]  Veronica [name] is a 64 y.o. female coming in with complaint of neck pain. OMT. States she has had headaches at night. Overall still in some pain today.  Patient would consider no mild exacerbation.  Has been doing relatively well until recently.  Now describes the pain as a dull, throbbing aching pain with some associated headaches now.     Past Medical History:  Diagnosis Date  . Depression   . Dry eyes    Past Surgical History:  Procedure Laterality Date  . CLOSED REDUCTION HAND FRACTURE  2002   left  . LASIK     Social History   Socioeconomic History  . Marital status: Married    Spouse name: Not on file  . Number of children: Not on file  . Years of education: Not on file  . Highest education level: Not on file  Occupational History  . Not on file  Tobacco Use  . Smoking status: Former Smoker    Quit date: 06/11/1988    Years since quitting: 30.9  . Smokeless tobacco: Never Used  Substance and Sexual Activity  . Alcohol use: Yes    Alcohol/week: 6.0 standard drinks    Types: 6 Glasses of wine per week  . Drug use: No  . Sexual activity: Not on file  Other Topics Concern  . Not on file  Social History Narrative   Married.   No children.   Works for [employer].   Enjoys showing and riding horses.    Social Determinants of Health   Financial Resource  Strain:   . Difficulty of Paying Living Expenses: Not on file  Food Insecurity:   . Worried About Charity fundraiser in the Last Year: Not on file  . Ran Out of Food in the Last Year: Not on file  Transportation Needs:   . Lack of Transportation (Medical): Not on file  . Lack of Transportation (Non-Medical): Not on file  Physical Activity:   . Days of Exercise per Week: Not on file  . Minutes of Exercise per Session: Not on file  Stress:   . Feeling of Stress : Not on file  Social Connections:   . Frequency of Communication with Friends and Family: Not on file  . Frequency of Social Gatherings with Friends and Family: Not on file  . Attends Religious Services: Not on file  . Active Member of Clubs or Organizations: Not on file  . Attends Archivist Meetings: Not on file  . Marital Status: Not on file   No Known Allergies Family History  Problem Relation Age of Onset  . Hypertension Mother   . Heart disease Mother   . COPD Father   . Colon cancer Neg Hx     Current Outpatient Medications (Endocrine & Metabolic):  .  estrogen, conjugated,-medroxyprogesterone (PREMPRO) 0.625-5 MG tablet, Take 1 tablet by mouth daily. .  predniSONE (  DELTASONE) 50 MG tablet, Take one tablet daily for the next 5 days.    Current Outpatient Medications (Analgesics):  [name]  Ibuprofen-Famotidine 800-26.6 MG TABS, Take 1 tablet by mouth 3 (three) times daily. .  meloxicam (MOBIC) 15 MG tablet, TAKE 1 TABLET BY MOUTH EVERY DAY AS NEEDED .  SUMAtriptan-naproxen (TREXIMET) 85-500 MG tablet, Take 1 tablet by mouth at migraine onset. May repeat in 2 hours if migraine persists, do not exceed 2 tablets in 24 hours.   Current Outpatient Medications (Other):  [name]  buPROPion (WELLBUTRIN XL) 150 MG 24 hr tablet, Take 1 tablet (150 mg total) by mouth every morning. NEED APPOINTMENT FOR ANY MORE REFILLS .  Diclofenac Sodium (PENNSAID) 2 % SOLN, Place 1 application onto the skin 2 (two) times daily.    Reviewed prior external information including notes and imaging from  primary care provider As well as notes that were available from care everywhere and other healthcare systems.  Past medical history, social, surgical and family history all reviewed in electronic medical record.  No pertanent information unless stated regarding to the chief complaint.   Review of Systems:  No headache, visual changes, nausea, vomiting, diarrhea, constipation, dizziness, abdominal pain, skin rash, fevers, chills, night sweats, weight loss, swollen lymph nodes, body aches, joint swelling, chest pain, shortness of breath, mood changes. POSITIVE muscle aches  Objective  Blood pressure 130/76, pulse 65, height 5\' 2"  (1.575 m), weight 153 lb (69.4 kg), SpO2 98 %.   General: No apparent distress alert and oriented x3 mood and affect normal, dressed appropriately.  HEENT: Pupils equal, extraocular movements intact  Respiratory: Patient's speak in full sentences and does not appear short of breath  Cardiovascular: No lower extremity edema, non tender, no erythema  Skin: Warm dry intact with no signs of infection or rash on extremities or on axial skeleton.  Abdomen: Soft nontender  Neuro: Cranial nerves II through XII are intact, neurovascularly intact in all extremities with 2+ DTRs and 2+ pulses.  Lymph: No lymphadenopathy of posterior or anterior cervical chain or axillae bilaterally.  Gait normal with good balance and coordination.  MSK:  tender with full range of motion and good stability and symmetric strength and tone of shoulders, elbows, wrist, hip, knee and ankles bilaterally.  Neck exam does have some mild loss of lordosis, and is tender to palpation in the parascapular region bilaterally right greater than left.  Patient does have some mild limited sidebending right and the left.  Still tightness noted at the left occipital area more than anywhere else.  Osteopathic findings C2 flexed rotated and side  bent left C4 flexed rotated and side bent left C6 flexed rotated and side bent left T5 extended rotated and side bent right inhaled rib T9 extended rotated and side bent left     Impression and Recommendations:     This case required medical decision making of moderate complexity. The above documentation has been reviewed and is accurate and complete Veronica Pulley, DO       Note: This dictation was prepared with Dragon dictation along with smaller phrase technology. Any transcriptional errors that result from this process are unintentional.

## 2019-05-19 NOTE — Assessment & Plan Note (Signed)
Decision today to treat with OMT was based on Physical Exam  After verbal consent patient was treated with HVLA, ME, FPR techniques in cervical, thoracic, rib areas  Patient tolerated the procedure well with improvement in symptoms  Patient given exercises, stretches and lifestyle modifications  See medications in patient instructions if given  Patient will follow up in 4-8 weeks 

## 2019-05-19 NOTE — Assessment & Plan Note (Signed)
Chronic problem with mild exacerbation.  We discussed with patient in great length.  Patient recently is started on some different estrogen replacement medications and we will see how patient responds.  I do think that that could help her headaches as well.  Patient is to do home exercise, icing regimen, topical anti-inflammatories.  Has meloxicam for breakthrough.  Patient responds well to manipulation and will follow up with me again in 6 to 8 weeks

## 2019-05-19 NOTE — Patient Instructions (Signed)
Good to see you Exercise 3 times a week See me again in 4-6 weeks

## 2019-06-15 NOTE — Assessment & Plan Note (Addendum)
Chronic problem with mild exacerbation.  Discussed posture and ergonomics, we discussed home exercises.  Social determinants of health include patient is unable to exercise regularly and does have increasing stress secondary to pandemic and personal responsibilities.  Patient will follow up with me again in 4 to 8 weeks new medication today when we discussed medication management of gabapentin at night.

## 2019-06-15 NOTE — Assessment & Plan Note (Signed)
Decision today to treat with OMT was based on Physical Exam  After verbal consent patient was treated with HVLA, ME, FPR techniques in cervical, thoracic, rib,  lumbar and sacral areas  Patient tolerated the procedure well with improvement in symptoms  Patient given exercises, stretches and lifestyle modifications  See medications in patient instructions if given  Patient will follow up in 4-8 weeks 

## 2019-06-15 NOTE — Progress Notes (Signed)
[name] [address] [place] Phone: [phone] Subjective:   I Veronica [name] am serving as a Education administrator for Dr. Hulan Saas.  This visit occurred during the SARS-CoV-2 public health emergency.  Safety protocols were in place, including screening questions prior to the visit, additional usage of staff PPE, and extensive cleaning of exam room while observing appropriate contact time as indicated for disinfecting solutions.   I'm seeing this patient by the request  of:  [name], NP  CC: Neck and back pain follow-up  [id number]   Veronica [name] is a 64 y.o. female coming in with complaint of neck pain. Last seen on 05/19/2019. Patient states she is still having discomfort. Patient states that last visit her neck was better for only one day.  Patient states that this is the first day she did not have significant amount of pain previously.  Is happy with the results.  Making some progress at the moment.  Patient states that still having some pain at night.  Sometimes with some radiation of the pain.      Past Medical History:  Diagnosis Date  . Depression   . Dry eyes    Past Surgical History:  Procedure Laterality Date  . CLOSED REDUCTION HAND FRACTURE  2002   left  . LASIK     Social History   Socioeconomic History  . Marital status: Married    Spouse name: Not on file  . Number of children: Not on file  . Years of education: Not on file  . Highest education level: Not on file  Occupational History  . Not on file  Tobacco Use  . Smoking status: Former Smoker    Quit date: 06/11/1988    Years since quitting: 31.0  . Smokeless tobacco: Never Used  Substance and Sexual Activity  . Alcohol use: Yes    Alcohol/week: 6.0 standard drinks    Types: 6 Glasses of wine per week  . Drug use: No  . Sexual activity: Not on file  Other Topics Concern  . Not on file  Social History Narrative   Married.   No children.   Works for [employer].   Enjoys showing and riding horses.    Social Determinants of Health   Financial Resource Strain:   . Difficulty of Paying Living Expenses:   Food Insecurity:   . Worried About Charity fundraiser in the Last Year:   . Arboriculturist in the Last Year:   Transportation Needs:   . Film/video editor (Medical):   [name] Lack of Transportation (Non-Medical):   Physical Activity:   . Days of Exercise per Week:   . Minutes of Exercise per Session:   Stress:   . Feeling of Stress :   Social Connections:   . Frequency of Communication with Friends and Family:   . Frequency of Social Gatherings with Friends and Family:   . Attends Religious Services:   . Active Member of Clubs or Organizations:   . Attends Archivist Meetings:   [name] Marital Status:    No Known Allergies Family History  Problem Relation Age of Onset  . Hypertension Mother   . Heart disease Mother   . COPD Father   . Colon cancer Neg Hx     Current Outpatient Medications (Endocrine & Metabolic):  .  estrogen, conjugated,-medroxyprogesterone (PREMPRO) 0.625-5 MG tablet, Take 1 tablet by mouth daily. [name]  predniSONE (DELTASONE) 50 MG tablet, Take one tablet daily for the next 5 days.    Current Outpatient Medications (Analgesics):  [name]  Ibuprofen-Famotidine 800-26.6 MG TABS, Take 1 tablet by mouth 3 (three) times daily. .  meloxicam (MOBIC) 15 MG tablet, TAKE 1 TABLET BY MOUTH EVERY DAY AS NEEDED .  SUMAtriptan-naproxen (TREXIMET) 85-500 MG tablet, Take 1 tablet by mouth at migraine onset. May repeat in 2 hours if migraine persists, do not exceed 2 tablets in 24 hours.   Current Outpatient Medications (Other):  [name]  buPROPion (WELLBUTRIN XL) 150 MG 24 hr tablet, Take 1 tablet (150 mg total) by mouth every morning. NEED APPOINTMENT FOR ANY MORE REFILLS .  Diclofenac Sodium (PENNSAID) 2 % SOLN, Place 1 application onto the skin 2 (two) times daily. [name]  gabapentin (NEURONTIN)  100 MG capsule, Take 2 capsules (200 mg total) by mouth at bedtime.   Reviewed prior external information including notes and imaging from  primary care provider As well as notes that were available from care everywhere and other healthcare systems.  Past medical history, social, surgical and family history all reviewed in electronic medical record.  No pertanent information unless stated regarding to the chief complaint.   Review of Systems:  No visual changes, nausea, vomiting, diarrhea, constipation, dizziness, abdominal pain, skin rash, fevers, chills, night sweats, weight loss, swollen lymph nodes, body aches, joint swelling, chest pain, shortness of breath, mood changes. POSITIVE muscle aches, headaches  Objective  Blood pressure 130/80, pulse 72, height 5\' 2"  (1.575 m), weight 149 lb (67.6 kg), SpO2 98 %.   General: No apparent distress alert and oriented x3 mood and affect normal, dressed appropriately.  HEENT: Pupils equal, extraocular movements intact  Respiratory: Patient's speak in full sentences and does not appear short of breath  Cardiovascular: No lower extremity edema, non tender, no erythema  Skin: Warm dry intact with no signs of infection or rash on extremities or on axial skeleton.  Abdomen: Soft nontender  Neuro: Cranial nerves II through XII are intact, neurovascularly intact in all extremities with 2+ DTRs and 2+ pulses.  Lymph: No lymphadenopathy of posterior or anterior cervical chain or axillae bilaterally.  Gait normal with good balance and coordination.  MSK:  Non tender with full range of motion and good stability and symmetric strength and tone of shoulders, elbows, wrist, hip, knee and ankles bilaterally.  Neck: Inspection mild loss of lordosis. No palpable stepoffs. Negative Spurling's maneuver. Mild limited range of motion in sidebending of 10 degrees bilaterally. Grip strength and sensation normal in bilateral hands Strength good C4 to T1  distribution No sensory change to C4 to T1 Negative Hoffman sign bilaterally Reflexes normal Tightness noted in the parascapular region bilaterally right and left.    Osteopathic findings C2 flexed rotated and side bent right C4 flexed rotated and side bent left C6 flexed rotated and side bent left T3 extended rotated and side bent right inhaled third rib T9 extended rotated and side bent left     Impression and Recommendations:     This case required medical decision making of moderate complexity. The above documentation has been reviewed and is accurate and complete [name], DO       Note: This dictation was prepared with Dragon dictation along with smaller phrase technology. Any transcriptional errors that result from this process are unintentional.

## 2019-06-16 ENCOUNTER — Encounter: Payer: Self-pay | Admitting: Family Medicine

## 2019-06-16 ENCOUNTER — Ambulatory Visit: Payer: 59 | Admitting: Family Medicine

## 2019-06-16 ENCOUNTER — Other Ambulatory Visit: Payer: Self-pay

## 2019-06-16 ENCOUNTER — Ambulatory Visit (INDEPENDENT_AMBULATORY_CARE_PROVIDER_SITE_OTHER): Payer: 59

## 2019-06-16 ENCOUNTER — Ambulatory Visit: Payer: 59 | Attending: Internal Medicine

## 2019-06-16 VITALS — BP 130/80 | HR 72 | Ht 62.0 in | Wt 149.0 lb

## 2019-06-16 DIAGNOSIS — M999 Biomechanical lesion, unspecified: Secondary | ICD-10-CM | POA: Diagnosis not present

## 2019-06-16 DIAGNOSIS — G8929 Other chronic pain: Secondary | ICD-10-CM | POA: Diagnosis not present

## 2019-06-16 DIAGNOSIS — M549 Dorsalgia, unspecified: Secondary | ICD-10-CM | POA: Diagnosis not present

## 2019-06-16 DIAGNOSIS — Z23 Encounter for immunization: Secondary | ICD-10-CM

## 2019-06-16 DIAGNOSIS — M542 Cervicalgia: Secondary | ICD-10-CM

## 2019-06-16 MED ORDER — GABAPENTIN 100 MG PO CAPS: 200.0000 mg | ORAL_CAPSULE | Freq: Every day | ORAL | 3 refills | Status: DC

## 2019-06-16 NOTE — Progress Notes (Signed)
   Covid-19 Vaccination Clinic  Name:  Veronica [name]    MRN: [id number] DOB: March 26, 1956  06/16/2019  [name] was observed post Covid-19 immunization for 15 minutes without incident. She was provided with Vaccine Information Sheet and instruction to access the V-Safe system.   [name] was instructed to call 911 with any severe reactions post vaccine: [name] Difficulty breathing  . Swelling of face and throat  . A fast heartbeat  . A bad rash all over body  . Dizziness and weakness   Immunizations Administered    Name Date Dose VIS Date Route   Pfizer COVID-19 Vaccine 06/16/2019 11:28 AM 0.3 mL 03/13/2019 Intramuscular   Manufacturer: [place]   Lot: [id number]   [place]: [id number]

## 2019-06-16 NOTE — Patient Instructions (Signed)
Xray today Hold on labs See me again in 5-6 weeks

## 2019-07-07 ENCOUNTER — Ambulatory Visit: Payer: 59 | Attending: Internal Medicine

## 2019-07-07 DIAGNOSIS — Z23 Encounter for immunization: Secondary | ICD-10-CM

## 2019-07-07 NOTE — Progress Notes (Signed)
   Covid-19 Vaccination Clinic  Name:  Australia Penniman    MRN: [id number] DOB: [birth year]  07/07/2019  Ms. Rias was observed post Covid-19 immunization for 15 minutes without incident. She was provided with Vaccine Information Sheet and instruction to access the V-Safe system.   Ms. Korbel was instructed to call 911 with any severe reactions post vaccine: [name] Difficulty breathing  . Swelling of face and throat  . A fast heartbeat  . A bad rash all over body  . Dizziness and weakness   Immunizations Administered    Name Date Dose VIS Date Route   Pfizer COVID-19 Vaccine 07/07/2019  2:52 PM 0.3 mL 03/13/2019 Intramuscular   Manufacturer: [place]   Lot: [id number]   [place]: [id number]

## 2019-07-21 ENCOUNTER — Ambulatory Visit: Payer: 59 | Admitting: Family Medicine

## 2019-07-21 ENCOUNTER — Other Ambulatory Visit: Payer: Self-pay

## 2019-07-21 ENCOUNTER — Encounter: Payer: Self-pay | Admitting: Family Medicine

## 2019-07-21 VITALS — BP 112/84 | HR 66 | Ht 62.0 in | Wt 150.0 lb

## 2019-07-21 DIAGNOSIS — M999 Biomechanical lesion, unspecified: Secondary | ICD-10-CM | POA: Diagnosis not present

## 2019-07-21 DIAGNOSIS — G8929 Other chronic pain: Secondary | ICD-10-CM | POA: Diagnosis not present

## 2019-07-21 DIAGNOSIS — M549 Dorsalgia, unspecified: Secondary | ICD-10-CM | POA: Diagnosis not present

## 2019-07-21 DIAGNOSIS — M542 Cervicalgia: Secondary | ICD-10-CM

## 2019-07-21 MED ORDER — GABAPENTIN 100 MG PO CAPS: 200.0000 mg | ORAL_CAPSULE | Freq: Every day | ORAL | 3 refills | Status: DC

## 2019-07-21 NOTE — Assessment & Plan Note (Signed)
Degenerative disc disease of the cervical spine.  Responding well to the gabapentin as well as the manipulation.  Discussed meloxicam.  Patient is appropriate for pain.  Stable overall.  Follow-up again in 4 to 8 weeks

## 2019-07-21 NOTE — Assessment & Plan Note (Signed)
   Decision today to treat with OMT was based on Physical Exam  After verbal consent patient was treated with HVLA, ME, FPR techniques in cervical, thoracic, rib,areas, all areas are chronic   Patient tolerated the procedure well with improvement in symptoms  Patient given exercises, stretches and lifestyle modifications  See medications in patient instructions if given  Patient will follow up in 4-8 weeks 

## 2019-07-21 NOTE — Progress Notes (Signed)
[place] Phone: [phone] Subjective:    I'm seeing this patient by the request  of:  [name], NP  CC: Neck pain follow-up  [id number]  Veronica [name] is a 64 y.o. female coming in with complaint of neck pain. Last seen on 06/16/2019 for OMT. Patient states that she has not had any issues since last visit. Has been using gabapentin since last visit.  Overall stable.  Continues to have pain but nothing that is severe enough to stop her from activity.  States that the gabapentin has helped with significant amount of tingling as well as helping at night.  No side effects to the medication at this time.      Past Medical History:  Diagnosis Date  . Depression   . Dry eyes    Past Surgical History:  Procedure Laterality Date  . CLOSED REDUCTION HAND FRACTURE  2002   left  . LASIK     Social History   Socioeconomic History  . Marital status: Married    Spouse name: Not on file  . Number of children: Not on file  . Years of education: Not on file  . Highest education level: Not on file  Occupational History  . Not on file  Tobacco Use  . Smoking status: Former Smoker    Quit date: 06/11/1988    Years since quitting: 31.1  . Smokeless tobacco: Never Used  Substance and Sexual Activity  . Alcohol use: Yes    Alcohol/week: 6.0 standard drinks    Types: 6 Glasses of wine per week  . Drug use: No  . Sexual activity: Not on file  Other Topics Concern  . Not on file  Social History Narrative   Married.   No children.   Works for [employer].   Enjoys showing and riding horses.    Social Determinants of Health   Financial Resource Strain:   . Difficulty of Paying Living Expenses:   Food Insecurity:   . Worried About Charity fundraiser in the Last Year:   . Arboriculturist in the Last Year:   Transportation Needs:   . Film/video editor (Medical):   [name] Lack of  Transportation (Non-Medical):   Physical Activity:   . Days of Exercise per Week:   . Minutes of Exercise per Session:   Stress:   . Feeling of Stress :   Social Connections:   . Frequency of Communication with Friends and Family:   . Frequency of Social Gatherings with Friends and Family:   . Attends Religious Services:   . Active Member of Clubs or Organizations:   . Attends Archivist Meetings:   [name] Marital Status:    No Known Allergies Family History  Problem Relation Age of Onset  . Hypertension Mother   . Heart disease Mother   . COPD Father   . Colon cancer Neg Hx     Current Outpatient Medications (Endocrine & Metabolic):  .  estrogen, conjugated,-medroxyprogesterone (PREMPRO) 0.625-5 MG tablet, Take 1 tablet by mouth daily. .  predniSONE (DELTASONE) 50 MG tablet, Take one tablet daily for the next 5 days.    Current Outpatient Medications (Analgesics):  [name]  Ibuprofen-Famotidine 800-26.6 MG TABS, Take 1 tablet by mouth 3 (three) times daily. .  meloxicam (MOBIC) 15 MG tablet, TAKE 1 TABLET BY MOUTH EVERY DAY AS NEEDED .  SUMAtriptan-naproxen (TREXIMET) 85-500 MG tablet,  Take 1 tablet by mouth at migraine onset. May repeat in 2 hours if migraine persists, do not exceed 2 tablets in 24 hours.   Current Outpatient Medications (Other):  [name]  buPROPion (WELLBUTRIN XL) 150 MG 24 hr tablet, Take 1 tablet (150 mg total) by mouth every morning. NEED APPOINTMENT FOR ANY MORE REFILLS .  Diclofenac Sodium (PENNSAID) 2 % SOLN, Place 1 application onto the skin 2 (two) times daily. [name]  gabapentin (NEURONTIN) 100 MG capsule, Take 2 capsules (200 mg total) by mouth at bedtime.   Reviewed prior external information including notes and imaging from  primary care provider As well as notes that were available from care everywhere and other healthcare systems.  Past medical history, social, surgical and family history all reviewed in electronic medical record.  No pertanent  information unless stated regarding to the chief complaint.   Review of Systems:  No headache, visual changes, nausea, vomiting, diarrhea, constipation, dizziness, abdominal pain, skin rash, fevers, chills, night sweats, weight loss, swollen lymph nodes, body aches, joint swelling, chest pain, shortness of breath, mood changes. POSITIVE muscle aches  Objective  Blood pressure 112/84, pulse 66, height 5\' 2"  (1.575 m), weight 150 lb (68 kg), SpO2 99 %.   General: No apparent distress alert and oriented x3 mood and affect normal, dressed appropriately.  HEENT: Pupils equal, extraocular movements intact  Respiratory: Patient's speak in full sentences and does not appear short of breath  Cardiovascular: No lower extremity edema, non tender, no erythema  Neuro: Cranial nerves II through XII are intact, neurovascularly intact in all extremities with 2+ DTRs and 2+ pulses.  Gait normal with good balance and coordination.  MSK:  Non tender with full range of motion and good stability and symmetric strength and tone of shoulders, elbows, wrist, hip, knee and ankles bilaterally.  Arthritic changes of the neck noted.  Loss of lordosis.  Limited sidebending and right-sided and left-sided rotation.  10 degrees of left-sided rotation noted.  Negative Spurling's maneuver today.  Osteopathic findings  C2 flexed rotated and side bent right C6 flexed rotated and side bent left T3 extended rotated and side bent right inhaled third rib T9 extended rotated and side bent left    Impression and Recommendations:     This cas The above documentation has been reviewed and is accurate and complete [name], DO       Note: This dictation was prepared with Dragon dictation along with smaller phrase technology. Any transcriptional errors that result from this process are unintentional.

## 2019-07-21 NOTE — Patient Instructions (Signed)
Overall not bad Continue medication Good luck with the hourses See me again in 6-10 weeks

## 2019-07-26 ENCOUNTER — Other Ambulatory Visit: Payer: Self-pay | Admitting: Primary Care

## 2019-07-26 DIAGNOSIS — F419 Anxiety disorder, unspecified: Secondary | ICD-10-CM

## 2019-07-26 DIAGNOSIS — F329 Major depressive disorder, single episode, unspecified: Secondary | ICD-10-CM

## 2019-07-26 DIAGNOSIS — F32A Depression, unspecified: Secondary | ICD-10-CM

## 2019-08-05 ENCOUNTER — Telehealth: Payer: Self-pay | Admitting: Family Medicine

## 2019-08-05 NOTE — Telephone Encounter (Signed)
Patient called stating that she thinks she may have a pinched nerve and is requesting to be seen sooner with Dr Tamala Julian. I offered her the opening on Friday, May 14th but she will be out of town that day and she did not want to see Dr Georgina Snell being that she may need an adjustment. Is there anywhere we could schedule her?

## 2019-08-05 NOTE — Telephone Encounter (Signed)
Called patient and schedule on Monday.

## 2019-08-10 ENCOUNTER — Encounter: Payer: Self-pay | Admitting: Family Medicine

## 2019-08-10 ENCOUNTER — Other Ambulatory Visit: Payer: Self-pay

## 2019-08-10 ENCOUNTER — Ambulatory Visit: Payer: 59 | Admitting: Family Medicine

## 2019-08-10 ENCOUNTER — Ambulatory Visit: Payer: Self-pay

## 2019-08-10 VITALS — BP 140/80 | HR 64 | Ht 62.0 in | Wt 147.0 lb

## 2019-08-10 DIAGNOSIS — M25531 Pain in right wrist: Secondary | ICD-10-CM | POA: Diagnosis not present

## 2019-08-10 DIAGNOSIS — M999 Biomechanical lesion, unspecified: Secondary | ICD-10-CM

## 2019-08-10 DIAGNOSIS — M542 Cervicalgia: Secondary | ICD-10-CM | POA: Diagnosis not present

## 2019-08-10 DIAGNOSIS — F419 Anxiety disorder, unspecified: Secondary | ICD-10-CM

## 2019-08-10 DIAGNOSIS — G5601 Carpal tunnel syndrome, right upper limb: Secondary | ICD-10-CM

## 2019-08-10 DIAGNOSIS — M549 Dorsalgia, unspecified: Secondary | ICD-10-CM

## 2019-08-10 DIAGNOSIS — G8929 Other chronic pain: Secondary | ICD-10-CM

## 2019-08-10 DIAGNOSIS — F32A Depression, unspecified: Secondary | ICD-10-CM

## 2019-08-10 MED ORDER — BUPROPION HCL ER (XL) 150 MG PO TB24: 150.0000 mg | ORAL_TABLET | Freq: Every morning | ORAL | 0 refills | Status: DC

## 2019-08-10 NOTE — Patient Instructions (Addendum)
Good to see you Wrist brace at night See me again in 6-7 weeks

## 2019-08-10 NOTE — Progress Notes (Signed)
[facility] [address] [place] Phone: [phone] Subjective:   I Veronica [name] am serving as a Education administrator for Dr. Hulan Saas.  This visit occurred during the SARS-CoV-2 public health emergency.  Safety protocols were in place, including screening questions prior to the visit, additional usage of staff PPE, and extensive cleaning of exam room while observing appropriate contact time as indicated for disinfecting solutions.   I'm seeing this patient by the request  of:  [name], NP  CC: Upper back pain follow-up  [id number]  Veronica [name] is a 64 y.o. female coming in with complaint of back pain. Last seen on 07/21/2019 for OMT. Patient states she has numbness in the right arm. Pain is on the left side of the neck. Pain is mostly at night.  Patient states that it seems to be the hand a little bit more than actually the neck a little bit.  Does have more numbness on a regular basis.  Rates the severity of pain is 8 out of 10.      Past Medical History:  Diagnosis Date  . Depression   . Dry eyes    Past Surgical History:  Procedure Laterality Date  . CLOSED REDUCTION HAND FRACTURE  2002   left  . LASIK     Social History   Socioeconomic History  . Marital status: Married    Spouse name: Not on file  . Number of children: Not on file  . Years of education: Not on file  . Highest education level: Not on file  Occupational History  . Not on file  Tobacco Use  . Smoking status: Former Smoker    Quit date: 06/11/1988    Years since quitting: 31.1  . Smokeless tobacco: Never Used  Substance and Sexual Activity  . Alcohol use: Yes    Alcohol/week: 6.0 standard drinks    Types: 6 Glasses of wine per week  . Drug use: No  . Sexual activity: Not on file  Other Topics Concern  . Not on file  Social History Narrative   Married.   No children.   Works for [employer].   Enjoys showing and riding horses.     Social Determinants of Health   Financial Resource Strain:   . Difficulty of Paying Living Expenses:   Food Insecurity:   . Worried About Charity fundraiser in the Last Year:   . Arboriculturist in the Last Year:   Transportation Needs:   . Film/video editor (Medical):   [name] Lack of Transportation (Non-Medical):   Physical Activity:   . Days of Exercise per Week:   . Minutes of Exercise per Session:   Stress:   . Feeling of Stress :   Social Connections:   . Frequency of Communication with Friends and Family:   . Frequency of Social Gatherings with Friends and Family:   . Attends Religious Services:   . Active Member of Clubs or Organizations:   . Attends Archivist Meetings:   [name] Marital Status:    No Known Allergies Family History  Problem Relation Age of Onset  . Hypertension Mother   . Heart disease Mother   . COPD Father   . Colon cancer Neg Hx     Current Outpatient Medications (Endocrine & Metabolic):  .  estrogen, conjugated,-medroxyprogesterone (PREMPRO) 0.625-5 MG tablet, Take 1 tablet by mouth daily. .  predniSONE (DELTASONE) 50 MG  tablet, Take one tablet daily for the next 5 days.    Current Outpatient Medications (Analgesics):  [name]  Ibuprofen-Famotidine 800-26.6 MG TABS, Take 1 tablet by mouth 3 (three) times daily. .  meloxicam (MOBIC) 15 MG tablet, TAKE 1 TABLET BY MOUTH EVERY DAY AS NEEDED .  SUMAtriptan-naproxen (TREXIMET) 85-500 MG tablet, Take 1 tablet by mouth at migraine onset. May repeat in 2 hours if migraine persists, do not exceed 2 tablets in 24 hours.   Current Outpatient Medications (Other):  [name]  Diclofenac Sodium (PENNSAID) 2 % SOLN, Place 1 application onto the skin 2 (two) times daily. [name]  gabapentin (NEURONTIN) 100 MG capsule, Take 2 capsules (200 mg total) by mouth at bedtime. [name]  buPROPion (WELLBUTRIN XL) 150 MG 24 hr tablet, Take 1 tablet (150 mg total) by mouth every morning. NEED APPOINTMENT FOR ANY MORE  REFILLS   Reviewed prior external information including notes and imaging from  primary care provider As well as notes that were available from care everywhere and other healthcare systems.  Past medical history, social, surgical and family history all reviewed in electronic medical record.  No pertanent information unless stated regarding to the chief complaint.   Review of Systems:  No headache, visual changes, nausea, vomiting, diarrhea, constipation, dizziness, abdominal pain, skin rash, fevers, chills, night sweats, weight loss, swollen lymph nodes, body aches, joint swelling, chest pain, shortness of breath, mood changes. POSITIVE muscle aches  Objective  Blood pressure 140/80, pulse 64, height 5\' 2"  (1.575 m), weight 147 lb (66.7 kg), SpO2 98 %.   General: No apparent distress alert and oriented x3 mood and affect normal, dressed appropriately.  HEENT: Pupils equal, extraocular movements intact  Respiratory: Patient's speak in full sentences and does not appear short of breath  Cardiovascular: No lower extremity edema, non tender, no erythema  Neuro: Cranial nerves II through XII are intact, neurovascularly intact in all extremities with 2+ DTRs and 2+ pulses.  Gait normal with good balance and coordination.  MSK: Neck exam does have some loss of lordosis.  Mild positive Spurling's with radicular symptoms on the right side.  Carpal tunnel though is positive with Tinel's sign at the wrist.  Positive Phalen's as well.  Mild atrophy of the thenar eminence noted.  Osteopathic findings C2 flexed rotated and side bent right C6 flexed rotated and side bent left T4 extended rotated and side bent right inhaled rib  .  Procedure: Real-time Ultrasound Guided Injection of right carpal tunnel Device: GE Logiq Q7  Ultrasound guided injection is preferred based studies that show increased duration, increased effect, greater accuracy, decreased procedural pain, increased response rate with  ultrasound guided versus blind injection.  Verbal informed consent obtained.  Time-out conducted.  Noted no overlying erythema, induration, or other signs of local infection.  Skin prepped in a sterile fashion.  Local anesthesia: Topical Ethyl chloride.  With sterile technique and under real time ultrasound guidance:  median nerve visualized.  23g 5/8 inch needle inserted distal to proximal approach into nerve sheath. Pictures taken nfor needle placement. Patient did have injection of 2 cc of 1% lidocaine, 1 cc of 0.5% Marcaine, and 1 cc of Kenalog 40 mg/dL. Completed without difficulty  Pain immediately resolved suggesting accurate placement of the medication.  Advised to call if fevers/chills, erythema, induration, drainage, or persistent bleeding.  Images permanently stored and available for review in the ultrasound unit.  Impression: Technically successful ultrasound guided injection.   Impression and Recommendations:     This  case required medical decision making of moderate complexity. The above documentation has been reviewed and is accurate and complete [name], DO       Note: This dictation was prepared with Dragon dictation along with smaller phrase technology. Any transcriptional errors that result from this process are unintentional.

## 2019-08-10 NOTE — Assessment & Plan Note (Signed)
Chronic neck and back pain.  Mild exacerbation.  Discussed medication management including the gabapentin, discussed icing regimen and home exercises, core strengthening and ergonomics.  Follow-up again in 4 to 8 weeks.

## 2019-08-10 NOTE — Assessment & Plan Note (Signed)
Patient given injection today.  Discussed bracing at night, discussed topical anti-inflammatories.  Gabapentin should help somewhat as well.  Follow-up again in 4 to 8 weeks

## 2019-08-10 NOTE — Assessment & Plan Note (Signed)
   Decision today to treat with OMT was based on Physical Exam  After verbal consent patient was treated with HVLA, ME, FPR techniques in cervical, thoracic, rib,areas, all areas are chronic   Patient tolerated the procedure well with improvement in symptoms  Patient given exercises, stretches and lifestyle modifications  See medications in patient instructions if given  Patient will follow up in 4-8 weeks 

## 2019-08-19 ENCOUNTER — Other Ambulatory Visit: Payer: Self-pay

## 2019-08-19 ENCOUNTER — Other Ambulatory Visit (HOSPITAL_COMMUNITY)
Admission: RE | Admit: 2019-08-19 | Discharge: 2019-08-19 | Disposition: A | Discharge status: Home / Self Care | Payer: 59 | Source: Ambulatory Visit | Attending: Primary Care | Admitting: Primary Care

## 2019-08-19 ENCOUNTER — Encounter: Payer: Self-pay | Admitting: Primary Care

## 2019-08-19 ENCOUNTER — Ambulatory Visit: Payer: 59 | Admitting: Primary Care

## 2019-08-19 VITALS — BP 116/74 | HR 59 | Temp 96.6°F | Ht 62.0 in | Wt 146.5 lb

## 2019-08-19 DIAGNOSIS — F329 Major depressive disorder, single episode, unspecified: Secondary | ICD-10-CM

## 2019-08-19 DIAGNOSIS — Z124 Encounter for screening for malignant neoplasm of cervix: Secondary | ICD-10-CM | POA: Diagnosis present

## 2019-08-19 DIAGNOSIS — M549 Dorsalgia, unspecified: Secondary | ICD-10-CM

## 2019-08-19 DIAGNOSIS — M999 Biomechanical lesion, unspecified: Secondary | ICD-10-CM | POA: Diagnosis not present

## 2019-08-19 DIAGNOSIS — M1712 Unilateral primary osteoarthritis, left knee: Secondary | ICD-10-CM

## 2019-08-19 DIAGNOSIS — F32A Depression, unspecified: Secondary | ICD-10-CM

## 2019-08-19 DIAGNOSIS — Z0000 Encounter for general adult medical examination without abnormal findings: Secondary | ICD-10-CM | POA: Diagnosis not present

## 2019-08-19 DIAGNOSIS — G43701 Chronic migraine without aura, not intractable, with status migrainosus: Secondary | ICD-10-CM

## 2019-08-19 DIAGNOSIS — N941 Unspecified dyspareunia: Secondary | ICD-10-CM

## 2019-08-19 DIAGNOSIS — F419 Anxiety disorder, unspecified: Secondary | ICD-10-CM

## 2019-08-19 DIAGNOSIS — M542 Cervicalgia: Secondary | ICD-10-CM

## 2019-08-19 DIAGNOSIS — G8929 Other chronic pain: Secondary | ICD-10-CM

## 2019-08-19 LAB — CBC
HCT: 40 % (ref 36.0–46.0)
Hemoglobin: 13.6 g/dL (ref 12.0–15.0)
MCHC: 34 g/dL (ref 30.0–36.0)
MCV: 94.7 fl (ref 78.0–100.0)
Platelets: 328 10*3/uL (ref 150.0–400.0)
RBC: 4.22 Mil/uL (ref 3.87–5.11)
RDW: 12.6 % (ref 11.5–15.5)
WBC: 7.3 10*3/uL (ref 4.0–10.5)

## 2019-08-19 LAB — COMPREHENSIVE METABOLIC PANEL
ALT: 16 U/L (ref 0–35)
AST: 23 U/L (ref 0–37)
Albumin: 4.3 g/dL (ref 3.5–5.2)
Alkaline Phosphatase: 44 U/L (ref 39–117)
BUN: 15 mg/dL (ref 6–23)
CO2: 28 mEq/L (ref 19–32)
Calcium: 9.3 mg/dL (ref 8.4–10.5)
Chloride: 104 mEq/L (ref 96–112)
Creatinine, Ser: 0.84 mg/dL (ref 0.40–1.20)
GFR: 68.73 mL/min (ref >60.00)
Glucose, Bld: 89 mg/dL (ref 70–99)
Potassium: 4.5 mEq/L (ref 3.5–5.1)
Sodium: 137 mEq/L (ref 135–145)
Total Bilirubin: 0.5 mg/dL (ref 0.2–1.2)
Total Protein: 6.9 g/dL (ref 6.0–8.3)

## 2019-08-19 LAB — LIPID PANEL
Cholesterol: 196 mg/dL (ref 0–200)
HDL: 69.5 mg/dL (ref >39.00)
LDL Cholesterol: 111 mg/dL — ABNORMAL HIGH (ref 0–99)
NonHDL: 126.87
Total CHOL/HDL Ratio: 3
Triglycerides: 77 mg/dL (ref 0.0–149.0)
VLDL: 15.4 mg/dL (ref 0.0–40.0)

## 2019-08-19 MED ORDER — PREMPRO 0.3-1.5 MG PO TABS: 1.0000 | ORAL_TABLET | Freq: Every day | ORAL | 0 refills | Status: DC

## 2019-08-19 MED ORDER — BUPROPION HCL ER (XL) 150 MG PO TB24: 150.0000 mg | ORAL_TABLET | Freq: Every morning | ORAL | 3 refills | Status: DC

## 2019-08-19 NOTE — Assessment & Plan Note (Signed)
Shingrix due, she kindly declines today. Other immunizations UTD. Pap smear due and completed today. Mammogram UTD. Colonoscopy UTD, due in 2024.  Encouraged a healthy diet, regular exercise. Exam today unremarkable. Labs pending.

## 2019-08-19 NOTE — Assessment & Plan Note (Signed)
Chronic use of estrogen for dyspareunia. Discussed risks of long term use including endometrial and breast cancer, CAD, dementia.  Mammogram UTD. Lipid panel pending. Pap smear completed today.  She does agree to reduce the dose, she will update.

## 2019-08-19 NOTE — Patient Instructions (Addendum)
Stop by the lab prior to leaving today. I will notify you of your results once received.   Think about the Shingrix vaccines.   We will be in touch regarding your pap smear results.  Continue exercising. You should be getting 150 minutes of moderate intensity exercise weekly.  Ensure you are consuming 64 ounces of water daily.  Please update me in a few months regarding the reduced hormone dose.   It was a pleasure to see you today!   Preventive Care 62-62 Years Old, Female Preventive care refers to visits with your health care provider and lifestyle choices that can promote health and wellness. This includes:  A yearly physical exam. This may also be called an annual well check.  Regular dental visits and eye exams.  Immunizations.  Screening for certain conditions.  Healthy lifestyle choices, such as eating a healthy diet, getting regular exercise, not using drugs or products that contain nicotine and tobacco, and limiting alcohol use. What can I expect for my preventive care visit? Physical exam Your health care provider will check your:  Height and weight. This may be used to calculate body mass index (BMI), which tells if you are at a healthy weight.  Heart rate and blood pressure.  Skin for abnormal spots. Counseling Your health care provider may ask you questions about your:  Alcohol, tobacco, and drug use.  Emotional well-being.  Home and relationship well-being.  Sexual activity.  Eating habits.  Work and work Statistician.  Method of birth control.  Menstrual cycle.  Pregnancy history. What immunizations do I need?  Influenza (flu) vaccine  This is recommended every year. Tetanus, diphtheria, and pertussis (Tdap) vaccine  You may need a Td booster every 10 years. Varicella (chickenpox) vaccine  You may need this if you have not been vaccinated. Zoster (shingles) vaccine  You may need this after age 40. Measles, mumps, and rubella (MMR)  vaccine  You may need at least one dose of MMR if you were born in 1957 or later. You may also need a second dose. Pneumococcal conjugate (PCV13) vaccine  You may need this if you have certain conditions and were not previously vaccinated. Pneumococcal polysaccharide (PPSV23) vaccine  You may need one or two doses if you smoke cigarettes or if you have certain conditions. Meningococcal conjugate (MenACWY) vaccine  You may need this if you have certain conditions. Hepatitis A vaccine  You may need this if you have certain conditions or if you travel or work in places where you may be exposed to hepatitis A. Hepatitis B vaccine  You may need this if you have certain conditions or if you travel or work in places where you may be exposed to hepatitis B. Haemophilus influenzae type b (Hib) vaccine  You may need this if you have certain conditions. Human papillomavirus (HPV) vaccine  If recommended by your health care provider, you may need three doses over 6 months. You may receive vaccines as individual doses or as more than one vaccine together in one shot (combination vaccines). Talk with your health care provider about the risks and benefits of combination vaccines. What tests do I need? Blood tests  Lipid and cholesterol levels. These may be checked every 5 years, or more frequently if you are over 75 years old.  Hepatitis C test.  Hepatitis B test. Screening  Lung cancer screening. You may have this screening every year starting at age 2 if you have a 30-pack-year history of smoking and currently smoke  or have quit within the past 15 years.  Colorectal cancer screening. All adults should have this screening starting at age 62 and continuing until age 62. Your health care provider may recommend screening at age 8 if you are at increased risk. You will have tests every 1-10 years, depending on your results and the type of screening test.  Diabetes screening. This is done by  checking your blood sugar (glucose) after you have not eaten for a while (fasting). You may have this done every 1-3 years.  Mammogram. This may be done every 1-2 years. Talk with your health care provider about when you should start having regular mammograms. This may depend on whether you have a family history of breast cancer.  BRCA-related cancer screening. This may be done if you have a family history of breast, ovarian, tubal, or peritoneal cancers.  Pelvic exam and Pap test. This may be done every 3 years starting at age 28. Starting at age 75, this may be done every 5 years if you have a Pap test in combination with an HPV test. Other tests  Sexually transmitted disease (STD) testing.  Bone density scan. This is done to screen for osteoporosis. You may have this scan if you are at high risk for osteoporosis. Follow these instructions at home: Eating and drinking  Eat a diet that includes fresh fruits and vegetables, whole grains, lean protein, and low-fat dairy.  Take vitamin and mineral supplements as recommended by your health care provider.  Do not drink alcohol if: ? Your health care provider tells you not to drink. ? You are pregnant, may be pregnant, or are planning to become pregnant.  If you drink alcohol: ? Limit how much you have to 0-1 drink a day. ? Be aware of how much alcohol is in your drink. In the [place] one drink equals one 12 oz bottle of beer (355 mL), one 5 oz glass of wine (148 mL), or one 1 oz glass of hard liquor (44 mL). Lifestyle  Take daily care of your teeth and gums.  Stay active. Exercise for at least 30 minutes on 5 or more days each week.  Do not use any products that contain nicotine or tobacco, such as cigarettes, e-cigarettes, and chewing tobacco. If you need help quitting, ask your health care provider.  If you are sexually active, practice safe sex. Use a condom or other form of birth control (contraception) in order to prevent pregnancy  and STIs (sexually transmitted infections).  If told by your health care provider, take low-dose aspirin daily starting at age 62. What's next?  Visit your health care provider once a year for a well check visit.  Ask your health care provider how often you should have your eyes and teeth checked.  Stay up to date on all vaccines. This information is not intended to replace advice given to you by your health care provider. Make sure you discuss any questions you have with your health care provider. Document Revised: 11/28/2017 Document Reviewed: 11/28/2017 Elsevier Patient Education  2020 [employer].

## 2019-08-19 NOTE — Progress Notes (Signed)
Subjective:    Patient ID: Veronica [name], female    DOB: [birth year], 64 y.o.   MRN: [id number]  HPI  This visit occurred during the SARS-CoV-2 public health emergency.  Safety protocols were in place, including screening questions prior to the visit, additional usage of staff PPE, and extensive cleaning of exam room while observing appropriate contact time as indicated for disinfecting solutions.    Veronica [name] is a 64 year old female with a history of chronic migraines, osteoarthritis, dyspareunia who presents today for complete physical and medication refills.   Immunizations: -Tetanus: Completed in 2019 -Influenza: Completed last season -Shingles: Never completed -Covid-19: Completed series   Diet: She endorses a healthy diet.  Exercise: She is riding her horse daily, walking.   Eye exam: Completes every 2 years Dental exam: Completes semi-annually   Pap Smear: No recent pap smear, due today Mammogram: Completed in December 2020 Colonoscopy: Completed in 2014, due in 2024 Hep C Screen: Negative   BP Readings from Last 3 Encounters:  08/19/19 116/74  08/10/19 140/80  07/21/19 112/84      Review of Systems  Constitutional: Negative for unexpected weight change.  HENT: Negative for rhinorrhea.   Respiratory: Negative for cough and shortness of breath.   Cardiovascular: Negative for chest pain.  Gastrointestinal: Negative for constipation and diarrhea.  Genitourinary: Negative for difficulty urinating.  Musculoskeletal: Positive for arthralgias and neck pain.  Skin: Negative for rash.  Allergic/Immunologic: Positive for environmental allergies.  Neurological: Positive for numbness. Negative for dizziness and headaches.  Psychiatric/Behavioral: The patient is not nervous/anxious.        Doing well on Wellbutrin       Past Medical History:  Diagnosis Date  . Depression   . Dry eyes      Social History   Socioeconomic History  . Marital status: Married    Spouse  name: Not on file  . Number of children: Not on file  . Years of education: Not on file  . Highest education level: Not on file  Occupational History  . Not on file  Tobacco Use  . Smoking status: Former Smoker    Quit date: 06/11/1988    Years since quitting: 31.2  . Smokeless tobacco: Never Used  Substance and Sexual Activity  . Alcohol use: Yes    Alcohol/week: 6.0 standard drinks    Types: 6 Glasses of wine per week  . Drug use: No  . Sexual activity: Not on file  Other Topics Concern  . Not on file  Social History Narrative   Married.   No children.   Works for [employer].   Enjoys showing and riding horses.    Social Determinants of Health   Financial Resource Strain:   . Difficulty of Paying Living Expenses:   Food Insecurity:   . Worried About Charity fundraiser in the Last Year:   . Arboriculturist in the Last Year:   Transportation Needs:   . Film/video editor (Medical):   [name] Lack of Transportation (Non-Medical):   Physical Activity:   . Days of Exercise per Week:   . Minutes of Exercise per Session:   Stress:   . Feeling of Stress :   Social Connections:   . Frequency of Communication with Friends and Family:   . Frequency of Social Gatherings with Friends and Family:   . Attends Religious Services:   . Active Member of Clubs or Organizations:   . Attends  Club or Organization Meetings:   [name] Marital Status:   Intimate Partner Violence:   . Fear of Current or Ex-Partner:   . Emotionally Abused:   [name] Physically Abused:   . Sexually Abused:     Past Surgical History:  Procedure Laterality Date  . CLOSED REDUCTION HAND FRACTURE  2002   left  . LASIK      Family History  Problem Relation Age of Onset  . Hypertension Mother   . Heart disease Mother   . COPD Father   . Colon cancer Neg Hx     No Known Allergies  Current Outpatient Medications on File Prior to Visit  Medication Sig Dispense Refill  . buPROPion (WELLBUTRIN XL)  150 MG 24 hr tablet Take 1 tablet (150 mg total) by mouth every morning. NEED APPOINTMENT FOR ANY MORE REFILLS 90 tablet 0  . Diclofenac Sodium (PENNSAID) 2 % SOLN Place 1 application onto the skin 2 (two) times daily. 1 Bottle 3  . estrogen, conjugated,-medroxyprogesterone (PREMPRO) 0.625-5 MG tablet Take 1 tablet by mouth daily. 84 tablet 3  . gabapentin (NEURONTIN) 100 MG capsule Take 2 capsules (200 mg total) by mouth at bedtime. 60 capsule 3  . Ibuprofen-Famotidine 800-26.6 MG TABS Take 1 tablet by mouth 3 (three) times daily. 90 tablet 3  . meloxicam (MOBIC) 15 MG tablet TAKE 1 TABLET BY MOUTH EVERY DAY AS NEEDED 30 tablet 1  . predniSONE (DELTASONE) 50 MG tablet Take one tablet daily for the next 5 days. 5 tablet 0  . SUMAtriptan-naproxen (TREXIMET) 85-500 MG tablet Take 1 tablet by mouth at migraine onset. May repeat in 2 hours if migraine persists, do not exceed 2 tablets in 24 hours. 10 tablet 0   No current facility-administered medications on file prior to visit.    BP 116/74   Pulse (!) 59   Temp (!) 96.6 F (35.9 C) (Temporal)   Ht 5\' 2"  (1.575 m)   Wt 146 lb 8 oz (66.5 kg)   SpO2 98%   BMI 26.80 kg/m    Objective:   Physical Exam  Constitutional: She is oriented to person, place, and time. She appears well-nourished.  HENT:  Right Ear: Tympanic membrane and ear canal normal.  Left Ear: Tympanic membrane and ear canal normal.  Mouth/Throat: Oropharynx is clear and moist.  Eyes: Pupils are equal, round, and reactive to light. EOM are normal.  Cardiovascular: Normal rate and regular rhythm.  Respiratory: Effort normal and breath sounds normal.  GI: Soft. Bowel sounds are normal. There is no abdominal tenderness.  Genitourinary: There is no tenderness or lesion on the right labia. There is no tenderness or lesion on the left labia. Cervix exhibits discharge. Cervix exhibits no motion tenderness. Right adnexum displays no tenderness. Left adnexum displays no tenderness.     No vaginal discharge or erythema.  No erythema in the vagina.    Genitourinary Comments: Scant amount of light greenish discharge. No symptoms. No foul smell.    Musculoskeletal:        General: Normal range of motion.     Cervical back: Neck supple.  Neurological: She is alert and oriented to person, place, and time. No cranial nerve deficit.  Reflex Scores:      Patellar reflexes are 2+ on the right side and 2+ on the left side. Skin: Skin is warm and dry.  Psychiatric: She has a normal mood and affect.           Assessment & Plan:

## 2019-08-19 NOTE — Assessment & Plan Note (Signed)
Following with sports medicine, managed on gabapentin and diclofenac solution.

## 2019-08-19 NOTE — Assessment & Plan Note (Signed)
Following with sports medicine, managed on gabapentin within some improvement.

## 2019-08-19 NOTE — Assessment & Plan Note (Signed)
Chronic, using Meloxicam infrequently.

## 2019-08-19 NOTE — Assessment & Plan Note (Signed)
Infrequent migraines, using Treximet sparingly.

## 2019-08-20 LAB — CYTOLOGY - PAP
Comment: NEGATIVE
Diagnosis: NEGATIVE
High risk HPV: NEGATIVE

## 2019-08-24 ENCOUNTER — Other Ambulatory Visit: Payer: Self-pay | Admitting: Primary Care

## 2019-08-24 DIAGNOSIS — N941 Unspecified dyspareunia: Secondary | ICD-10-CM

## 2019-09-03 ENCOUNTER — Other Ambulatory Visit: Payer: Self-pay

## 2019-09-03 ENCOUNTER — Ambulatory Visit: Payer: 59 | Admitting: Family Medicine

## 2019-09-03 ENCOUNTER — Encounter: Payer: Self-pay | Admitting: Family Medicine

## 2019-09-03 VITALS — BP 118/82 | HR 65 | Ht 62.0 in | Wt 147.0 lb

## 2019-09-03 DIAGNOSIS — M542 Cervicalgia: Secondary | ICD-10-CM

## 2019-09-03 DIAGNOSIS — G8929 Other chronic pain: Secondary | ICD-10-CM

## 2019-09-03 DIAGNOSIS — M549 Dorsalgia, unspecified: Secondary | ICD-10-CM

## 2019-09-03 DIAGNOSIS — M999 Biomechanical lesion, unspecified: Secondary | ICD-10-CM | POA: Diagnosis not present

## 2019-09-03 NOTE — Patient Instructions (Signed)
MRI cervical Will send in Valum Will discuss once we have results

## 2019-09-03 NOTE — Assessment & Plan Note (Signed)
Chronic problem with exacerbation.  Chronic neck pain with known degenerative disc disease.  Patient is not responding well to conservative therapy at this point.  Patient has been doing gabapentin, meloxicam, and has had prednisone.  We will get an MRI secondary to patient having more radicular symptoms at this time as well.  Patient would be a candidate for potential epidural.  Patient of course would like to avoid surgical intervention if possible.  Patient will increase activity slowly.  Follow-up again after advanced imaging to discuss further evaluation.

## 2019-09-03 NOTE — Progress Notes (Signed)
Veronica [name] Phone: [phone] Subjective:   Veronica [name], am serving as a scribe for Dr. Hulan [name]. This visit occurred during the SARS-CoV-2 public health emergency.  Safety protocols were in place, including screening questions prior to the visit, additional usage of staff PPE, and extensive cleaning of exam room while observing appropriate contact time as indicated for disinfecting solutions.   I'm seeing this patient by the request  of:  Veronica Koch, NP  CC: Neck pain follow-up  [id number]   08/10/2019 Patient given injection today.  Discussed bracing at night, discussed topical anti-inflammatories.  Gabapentin should help somewhat as well.  Follow-up again in 4 to 8 weeks  Update 09/03/2019 Veronica [name] is a 64 y.o. female coming in with complaint of back and neck pain as well as right wrist pain. Patient states that her wrist pain is better. Patient feels neck pain is getting worse. Denies any radiating symptoms. Using gabapentin at night.  Patient feels like not making any significant improvement in some mild increase in radicular symptoms.  Patient feels like everything is just getting worse and her quality of life may potentially getting worse as well.         Reviewed prior external information including notes and imaging from previsou exam, outside providers and external EMR if available.   As well as notes that were available from care everywhere and other healthcare systems.  Past medical history, social, surgical and family history all reviewed in electronic medical record.  [name]   Past Medical History:  Diagnosis Date  . Depression   . Dry eyes     [name]   Review of Systems:  [name], visual changes, nausea, vomiting, diarrhea, constipation, dizziness, abdominal pain, skin rash, fevers, chills,  night sweats, weight loss, swollen lymph nodes, body aches, joint swelling, chest pain, shortness of breath, mood changes. POSITIVE muscle aches  Objective  There were [name] vitals taken for this visit.   General: [name], dressed appropriately.  HEENT: Pupils equal, extraocular movements intact  Respiratory: Patient's speak in full sentences and does not appear short of breath  Cardiovascular: [name], non tender, [name]  Neuro: Cranial nerves II through XII are intact, neurovascularly intact in all extremities with 2+ DTRs and 2+ pulses.  Gait normal with good balance and coordination.  MSK:  Non tender with full range of motion and good stability and symmetric strength and tone of shoulders, elbows, wrist, hip, knee and ankles bilaterally.  Neck exam does have some loss of lordosis.  Positive Spurling's with radicular symptoms actually now bilaterally in the C7 distribution.  [name] significant weakness noted and deep tendon reflexes are still intact.  Osteopathic findings  C2 flexed rotated and side bent right C6 flexed rotated and side bent left T3 extended rotated and side bent right inhaled rib T8 extended rotated and side bent left       Assessment and Plan:   Chronic neck and back pain Chronic problem with exacerbation.  Chronic neck pain with known degenerative disc disease.  Patient is not responding well to conservative therapy at this point.  Patient has been doing gabapentin, meloxicam, and has had prednisone.  We will get an MRI secondary to patient having more radicular symptoms at this time as well.  Patient would be a  candidate for potential epidural.  Patient of course would like to avoid surgical intervention if possible.  Patient will increase activity slowly.  Follow-up again after advanced imaging to discuss further evaluation.    Nonallopathic problems  Decision today to treat with OMT  was based on Physical Exam  After verbal consent patient was treated with HVLA, ME, FPR techniques in cervical, rib, thoracic, lumbar, and sacral  areas  Patient tolerated the procedure well with improvement in symptoms  Patient given exercises, stretches and lifestyle modifications  See medications in patient instructions if given  Patient will follow up in 4-8 weeks      The above documentation has been reviewed and is accurate and complete Veronica Pulley, DO       Note: This dictation was prepared with Dragon dictation along with smaller phrase technology. Any transcriptional errors that result from this process are unintentional.

## 2019-09-08 ENCOUNTER — Encounter: Payer: Self-pay | Admitting: Family Medicine

## 2019-09-10 ENCOUNTER — Other Ambulatory Visit: Payer: Self-pay

## 2019-09-10 DIAGNOSIS — G8929 Other chronic pain: Secondary | ICD-10-CM

## 2019-09-30 ENCOUNTER — Encounter: Payer: Self-pay | Admitting: Family Medicine

## 2019-09-30 MED ORDER — DIAZEPAM 5 MG PO TABS: ORAL_TABLET | ORAL | 0 refills | Status: DC

## 2019-10-08 ENCOUNTER — Ambulatory Visit
Admission: RE | Admit: 2019-10-08 | Discharge: 2019-10-08 | Disposition: A | Discharge status: Home / Self Care | Payer: 59 | Source: Ambulatory Visit | Attending: Family Medicine | Admitting: Family Medicine

## 2019-10-08 DIAGNOSIS — M542 Cervicalgia: Secondary | ICD-10-CM

## 2019-10-09 ENCOUNTER — Encounter: Payer: Self-pay | Admitting: Family Medicine

## 2019-10-13 ENCOUNTER — Other Ambulatory Visit: Payer: Self-pay

## 2019-10-13 ENCOUNTER — Encounter: Payer: Self-pay | Admitting: Family Medicine

## 2019-10-13 ENCOUNTER — Ambulatory Visit: Payer: 59 | Admitting: Family Medicine

## 2019-10-13 VITALS — BP 112/80 | HR 60 | Ht 62.0 in | Wt 146.0 lb

## 2019-10-13 DIAGNOSIS — M25512 Pain in left shoulder: Secondary | ICD-10-CM | POA: Diagnosis not present

## 2019-10-13 DIAGNOSIS — G8929 Other chronic pain: Secondary | ICD-10-CM

## 2019-10-13 DIAGNOSIS — M549 Dorsalgia, unspecified: Secondary | ICD-10-CM

## 2019-10-13 DIAGNOSIS — M5030 Other cervical disc degeneration, unspecified cervical region: Secondary | ICD-10-CM | POA: Diagnosis not present

## 2019-10-13 DIAGNOSIS — M542 Cervicalgia: Secondary | ICD-10-CM | POA: Diagnosis not present

## 2019-10-13 DIAGNOSIS — M999 Biomechanical lesion, unspecified: Secondary | ICD-10-CM | POA: Diagnosis not present

## 2019-10-13 NOTE — Assessment & Plan Note (Signed)
Trigger point given in the left shoulder.  Responded well to this.  Hopefully this will be helpful.  It was in the trapezius, rhomboid, levator scapula.

## 2019-10-13 NOTE — Progress Notes (Signed)
[place] Phone: [phone] Subjective:   Veronica [name], am serving as a scribe for Dr. Hulan Saas. This visit occurred during the SARS-CoV-2 public health emergency.  Safety protocols were in place, including screening questions prior to the visit, additional usage of staff PPE, and extensive cleaning of exam room while observing appropriate contact time as indicated for disinfecting solutions.   I'm seeing this patient by the request  of:  [name], NP  CC: Neck pain follow-up  [id number]  Veronica [name] is a 64 y.o. female coming in with complaint of back and neck pain,. OMT 09/03/2019. Patient states continues to have pain overall.  Was sent for an MRI.     MRI 10/08/2019 IMPRESSION: 1. Mild left foraminal stenosis at C3-4. 2. Moderate left foraminal stenosis at C5-6.     Reviewed prior external information including notes and imaging from previsou exam, outside providers and external EMR if available.   As well as notes that were available from care everywhere and other healthcare systems.  Past medical history, social, surgical and family history all reviewed in electronic medical record.  [name]   Past Medical History:  Diagnosis Date  . Depression   . Dry eyes     [name]   Review of Systems:  [name], visual changes, nausea, vomiting, diarrhea, constipation, dizziness, abdominal pain, skin rash, fevers, chills, night sweats, weight loss, swollen lymph nodes, body aches, joint swelling, chest pain, shortness of breath, mood changes. POSITIVE muscle aches  Objective  Blood pressure 112/80, pulse 60, height 5\' 2"  (1.575 m), weight 146 lb (66.2 kg), SpO2 99 %.   General: [name], dressed appropriately.  HEENT: Pupils equal, extraocular movements  intact  Respiratory: Patient's speak in full sentences and does not appear short of breath  Cardiovascular: [name], non tender, [name]  Neuro: Cranial nerves II through XII are intact, neurovascularly intact in all extremities with 2+ DTRs and 2+ pulses.  Gait normal with good balance and coordination.  MSK:  Non tender with full range of motion and good stability and symmetric strength and tone of shoulders, elbows, wrist, hip, knee and ankles bilaterally.  Neck exam does have some mild loss of lordosis, tender to palpation in the left parascapular region.  Patient does have trigger points noted in the left shoulder including the rhomboid, trapezius, and levator scapula.  Mild limited range of motion in sidebending rotation of the neck  Osteopathic findings  C5 flexed rotated and side bent left C7 flexed rotated and side bent left T3 extended rotated and side bent right inhaled rib  After verbal consent patient was prepped with alcohol swab and with a 25-gauge half inch needle injected into 3 distinct trigger points with a total of 3 cc of 0.5% Marcaine and 1 cc of Kenalog 40 mg/mL.  These were in 3 different muscles including the trapezius, rhomboid and levator scapula.  Minimal blood loss.  Band-Aids placed.  Postinjection instructions given       Assessment and Plan: Degenerative disc disease, cervical MRI corresponds most of patient's symptoms.  Likely contributing.  Patient did have some trigger points noted today.  Patient was injected in these.  Chronic problem with exacerbation.  Discussed the gabapentin, meloxicam, and may need to consider the possibility of discontinuing the Wellbutrin and  starting a different medication in the long run for pain and anxiety control.  Patient will continue with physical therapy.  Follow-up again in 4 to 8 weeks.  Trigger point of left shoulder region Trigger point given in the left shoulder.  Responded well to this.   Hopefully this will be helpful.  It was in the trapezius, rhomboid, levator scapula.     Nonallopathic problems  Decision today to treat with OMT was based on Physical Exam  After verbal consent patient was treated with HVLA, ME, FPR techniques in cervical, rib, thoracic, lumbar, and sacral  areas  Patient tolerated the procedure well with improvement in symptoms  Patient given exercises, stretches and lifestyle modifications  See medications in patient instructions if given  Patient will follow up in 4-8 weeks      The above documentation has been reviewed and is accurate and complete Veronica Pulley, DO       Note: This dictation was prepared with Dragon dictation along with smaller phrase technology. Any transcriptional errors that result from this process are unintentional.

## 2019-10-13 NOTE — Patient Instructions (Signed)
Continue everything and PT See me again in 6 weeks

## 2019-10-13 NOTE — Assessment & Plan Note (Signed)
MRI corresponds most of patient's symptoms.  Likely contributing.  Patient did have some trigger points noted today.  Patient was injected in these.  Chronic problem with exacerbation.  Discussed the gabapentin, meloxicam, and may need to consider the possibility of discontinuing the Wellbutrin and starting a different medication in the long run for pain and anxiety control.  Patient will continue with physical therapy.  Follow-up again in 4 to 8 weeks.

## 2019-11-09 ENCOUNTER — Encounter: Payer: Self-pay | Admitting: Family Medicine

## 2019-11-09 ENCOUNTER — Ambulatory Visit: Payer: 59 | Admitting: Family Medicine

## 2019-11-09 ENCOUNTER — Other Ambulatory Visit: Payer: Self-pay

## 2019-11-09 VITALS — BP 106/76 | HR 66 | Ht 62.0 in | Wt 145.0 lb

## 2019-11-09 DIAGNOSIS — M999 Biomechanical lesion, unspecified: Secondary | ICD-10-CM | POA: Diagnosis not present

## 2019-11-09 DIAGNOSIS — M5030 Other cervical disc degeneration, unspecified cervical region: Secondary | ICD-10-CM

## 2019-11-09 NOTE — Assessment & Plan Note (Signed)
Severe overall.  Has responded fairly well though to an epidural.  Still only taking gabapentin 200 at night.  We discussed that increase if necessary which patient declined.  Patient has been doing more activity even though she does have some more tightness but no increasing in radicular symptoms.  Good grip strength noted.  Follow-up again in 6 to 8 weeks

## 2019-11-09 NOTE — Patient Instructions (Signed)
See me in 6-8 weeks  Continue gabapentin

## 2019-11-09 NOTE — Progress Notes (Signed)
  [place] Phone: [phone] Subjective:   Veronica [name], am serving as a scribe for Dr. Hulan [name]. This visit occurred during the SARS-CoV-2 public health emergency.  Safety protocols were in place, including screening questions prior to the visit, additional usage of staff PPE, and extensive cleaning of exam room while observing appropriate contact time as indicated for disinfecting solutions.   I'm seeing this patient by the request  of:  Veronica Koch, NP  CC: Neck pain follow-up  [id number]  Veronica [name] is a 64 y.o. female coming in with complaint of back and neck pain. OMT 10/13/2019. Patient states that left trap pain.   Medications patient has been prescribed: Gabapentin  Taking: Yes       MRI of cervical 7/9 IMPRESSION: 1. Mild left foraminal stenosis at C3-4. 2. Moderate left foraminal stenosis at C5-6.  Reviewed prior external information including notes and imaging from previsou exam, outside providers and external EMR if available.   As well as notes that were available from care everywhere and other healthcare systems.  Past medical history, social, surgical and family history all reviewed in electronic medical record.  [name]   Past Medical History:  Diagnosis Date  . Depression   . Dry eyes     [name]   Review of Systems:  [name], visual changes, nausea, vomiting, diarrhea, constipation, dizziness, abdominal pain, skin rash, fevers, chills, night sweats, weight loss, swollen lymph nodes, body aches, joint swelling, chest pain, shortness of breath, mood changes. POSITIVE muscle aches  Objective  There were [name] vitals taken for this visit.   General: [name], dressed appropriately.  HEENT: Pupils equal, extraocular movements intact    Respiratory: Patient's speak in full sentences and does not appear short of breath  Cardiovascular: [name], non tender, [name]  Neuro: Cranial nerves II through XII are intact, neurovascularly intact in all extremities with 2+ DTRs and 2+ pulses.  Gait normal with good balance and coordination.  MSK:  Non tender with full range of motion and good stability and symmetric strength and tone of shoulders, elbows, wrist, hip, knee and ankles bilaterally.  Neck exam does have some tightness noted.  Seems to be having mild exacerbation.  Patient does have tenderness to palpation diffusely.  Minimal left-sided sidebending and right-sided rotation of the neck lacks 15 degrees of extension.  Osteopathic findings  C2 flexed rotated and side bent right C7 flexed rotated and side bent left T3 extended rotated and side bent right inhaled rib T8 extended rotated and side bent left L2 flexed rotated and side bent right Sacrum right on right       Assessment and Plan:        The above documentation has been reviewed and is accurate and complete Veronica Pulley, DO       Note: This dictation was prepared with Dragon dictation along with smaller phrase technology. Any transcriptional errors that result from this process are unintentional.

## 2019-11-09 NOTE — Assessment & Plan Note (Signed)
   Decision today to treat with OMT was based on Physical Exam  After verbal consent patient was treated with HVLA, ME, FPR techniques in cervical, thoracic, rib areas, all areas are chronic   Patient tolerated the procedure well with improvement in symptoms  Patient given exercises, stretches and lifestyle modifications  See medications in patient instructions if given  Patient will follow up in 4-8 weeks 

## 2019-12-11 NOTE — Progress Notes (Signed)
[place] Phone: [phone] Subjective:   Veronica Veronica [name], am serving as a scribe for Dr. Hulan Saas. This visit occurred during the SARS-CoV-2 public health emergency.  Safety protocols were in place, including screening questions prior to the visit, additional usage of staff PPE, and extensive cleaning of exam room while observing appropriate contact time as indicated for disinfecting solutions.   I'm seeing this patient by the request  of:  [name], NP  CC: Left neck and shoulder pain follow-up  [id number]  Veronica Veronica [name] is a 64 y.o. female coming in with complaint of back and neck pain. OMT 11/09/2019.  Patient states that she is interested in getting epidural injection.  Patient is having pain on a daily basis.  Continues to have tightness of the shoulder girdle region noted.  Patient rates the severity of pain a 10.  Medications patient has been prescribed: Gabapentin, Meloxicam  Taking: Yes         Reviewed prior external information including notes and imaging from previsou exam, outside providers and external EMR if available.   As well as notes that were available from care everywhere and other healthcare systems.  Past medical history, social, surgical and family history all reviewed in electronic medical record.  Veronica [name]   Past Medical History:  Diagnosis Date  . Depression   . Dry eyes     Veronica [name]   Review of Systems:  Veronica [name], visual changes, nausea, vomiting, diarrhea, constipation, dizziness, abdominal pain, skin rash, fevers, chills, night sweats, weight loss, swollen lymph nodes, body aches, joint swelling, chest pain, shortness of breath, mood changes. POSITIVE muscle aches  Objective  Blood pressure 124/84, pulse 79, height 5\' 2"  (1.575 m), weight 145 lb (65.8 kg), SpO2 99 %.   General: Veronica [name]  apparent distress alert and oriented x3 mood and affect normal, dressed appropriately.  HEENT: Pupils equal, extraocular movements intact  Respiratory: Patient's speak in full sentences and does not appear short of breath  Cardiovascular: Veronica [name], non tender, Veronica [name]  Neuro: Cranial nerves II through XII are intact, neurovascularly intact in all extremities with 2+ DTRs and 2+ pulses.  Gait normal with good balance and coordination.  MSK:  Non tender with full range of motion and good stability and symmetric strength and tone of shoulders, elbows, wrist, hip, knee and ankles bilaterally.  Back - Normal skin, Spine with normal alignment and Veronica [name] deformity.  Veronica [name] tenderness to vertebral process palpation.  Paraspinous muscles are not tender and without spasm.   Range of motion is full at neck and lumbar sacral regions  Osteopathic findings  C7 flexed rotated and side bent left T3 extended rotated and side bent right inhaled rib T8 extended rotated and side bent left L2 flexed rotated and side bent right Sacrum right on right       Assessment and Plan:  Degenerative disc disease, cervical Patient does have more degenerative disc disease.  Discussed with patient about icing regimen and home exercise, which activities to doing which wants to avoid patient has failed all conservative therapy including formal physical therapy, gabapentin, Duexis, topical anti-inflammatories, meloxicam as well as trigger point injections but did cause some mild fat atrophy that we told patient would likely reverse itself in the near future.  Patient has elected to try a epidural that was ordered today.  We will see  how patient responds.  Follow-up 4 weeks after the epidural    Nonallopathic problems  Decision today to treat with OMT was based on Physical Exam  After verbal consent patient was treated with HVLA, ME, FPR techniques in cervical, rib, thoracic areas  Patient tolerated the  procedure well with improvement in symptoms  Patient given exercises, stretches and lifestyle modifications  See medications in patient instructions if given  Patient will follow up in 4-8 weeks      The above documentation has been reviewed and is accurate and complete Veronica Pulley, DO       Note: This dictation was prepared with Dragon dictation along with smaller phrase technology. Any transcriptional errors that result from this process are unintentional.

## 2019-12-15 ENCOUNTER — Encounter: Payer: Self-pay | Admitting: Family Medicine

## 2019-12-15 ENCOUNTER — Ambulatory Visit: Payer: 59 | Admitting: Family Medicine

## 2019-12-15 ENCOUNTER — Other Ambulatory Visit: Payer: Self-pay

## 2019-12-15 VITALS — BP 124/84 | HR 79 | Ht 62.0 in | Wt 145.0 lb

## 2019-12-15 DIAGNOSIS — M999 Biomechanical lesion, unspecified: Secondary | ICD-10-CM | POA: Diagnosis not present

## 2019-12-15 DIAGNOSIS — G8929 Other chronic pain: Secondary | ICD-10-CM

## 2019-12-15 DIAGNOSIS — M542 Cervicalgia: Secondary | ICD-10-CM

## 2019-12-15 DIAGNOSIS — M549 Dorsalgia, unspecified: Secondary | ICD-10-CM

## 2019-12-15 DIAGNOSIS — M5010 Cervical disc disorder with radiculopathy, unspecified cervical region: Secondary | ICD-10-CM

## 2019-12-15 DIAGNOSIS — M5030 Other cervical disc degeneration, unspecified cervical region: Secondary | ICD-10-CM | POA: Diagnosis not present

## 2019-12-15 NOTE — Assessment & Plan Note (Signed)
Patient does have more degenerative disc disease.  Discussed with patient about icing regimen and home exercise, which activities to doing which wants to avoid patient has failed all conservative therapy including formal physical therapy, gabapentin, Duexis, topical anti-inflammatories, meloxicam as well as trigger point injections but did cause some mild fat atrophy that we told patient would likely reverse itself in the near future.  Patient has elected to try a epidural that was ordered today.  We will see how patient responds.  Follow-up 4 weeks after the epidural

## 2019-12-15 NOTE — Patient Instructions (Addendum)
[facility] [phone] Husband take asprin 81 mg daily for a few weeks Back is from steroid injection but will resolve Tell Almyra Free doing counter pressure on hip and back See me again 4 weeks after epidural

## 2019-12-16 ENCOUNTER — Other Ambulatory Visit: Payer: Self-pay | Admitting: Family Medicine

## 2019-12-28 ENCOUNTER — Other Ambulatory Visit: Payer: 59

## 2019-12-30 ENCOUNTER — Other Ambulatory Visit: Payer: Self-pay

## 2019-12-30 ENCOUNTER — Ambulatory Visit
Admission: RE | Admit: 2019-12-30 | Discharge: 2019-12-30 | Disposition: A | Discharge status: Home / Self Care | Payer: 59 | Source: Ambulatory Visit | Attending: Family Medicine | Admitting: Family Medicine

## 2019-12-30 DIAGNOSIS — M5030 Other cervical disc degeneration, unspecified cervical region: Secondary | ICD-10-CM

## 2019-12-30 MED ORDER — TRIAMCINOLONE ACETONIDE 40 MG/ML IJ SUSP (RADIOLOGY)
40.0000 mg | Freq: Once | INTRAMUSCULAR | Status: AC
  Administered 2019-12-30: 40 mg via EPIDURAL

## 2019-12-30 MED ORDER — IOPAMIDOL (ISOVUE-M 300) INJECTION 61%
1.0000 mL | Freq: Once | INTRAMUSCULAR | Status: AC
  Administered 2019-12-30: 1 mL via EPIDURAL

## 2019-12-30 NOTE — Discharge Instructions (Signed)

## 2020-01-13 ENCOUNTER — Other Ambulatory Visit: Payer: Self-pay

## 2020-01-13 ENCOUNTER — Ambulatory Visit (INDEPENDENT_AMBULATORY_CARE_PROVIDER_SITE_OTHER): Payer: 59

## 2020-01-13 DIAGNOSIS — Z23 Encounter for immunization: Secondary | ICD-10-CM

## 2020-02-02 ENCOUNTER — Encounter: Payer: Self-pay | Admitting: Family Medicine

## 2020-02-02 ENCOUNTER — Other Ambulatory Visit: Payer: Self-pay

## 2020-02-02 ENCOUNTER — Ambulatory Visit: Payer: 59 | Admitting: Family Medicine

## 2020-02-02 VITALS — BP 122/78 | HR 73 | Ht 62.0 in | Wt 133.0 lb

## 2020-02-02 DIAGNOSIS — M5030 Other cervical disc degeneration, unspecified cervical region: Secondary | ICD-10-CM | POA: Diagnosis not present

## 2020-02-02 DIAGNOSIS — M999 Biomechanical lesion, unspecified: Secondary | ICD-10-CM | POA: Diagnosis not present

## 2020-02-02 DIAGNOSIS — M542 Cervicalgia: Secondary | ICD-10-CM | POA: Diagnosis not present

## 2020-02-02 DIAGNOSIS — R634 Abnormal weight loss: Secondary | ICD-10-CM | POA: Insufficient documentation

## 2020-02-02 DIAGNOSIS — M2550 Pain in unspecified joint: Secondary | ICD-10-CM | POA: Diagnosis not present

## 2020-02-02 LAB — CBC WITH DIFFERENTIAL/PLATELET
Basophils Absolute: 0 10*3/uL (ref 0.0–0.1)
Basophils Relative: 0.6 % (ref 0.0–3.0)
Eosinophils Absolute: 0.1 10*3/uL (ref 0.0–0.7)
Eosinophils Relative: 1.7 % (ref 0.0–5.0)
HCT: 41.8 % (ref 36.0–46.0)
Hemoglobin: 14.1 g/dL (ref 12.0–15.0)
Lymphocytes Relative: 33.4 % (ref 12.0–46.0)
Lymphs Abs: 2.4 10*3/uL (ref 0.7–4.0)
MCHC: 33.7 g/dL (ref 30.0–36.0)
MCV: 95.1 fl (ref 78.0–100.0)
Monocytes Absolute: 0.5 10*3/uL (ref 0.1–1.0)
Monocytes Relative: 7.2 % (ref 3.0–12.0)
Neutro Abs: 4.1 10*3/uL (ref 1.4–7.7)
Neutrophils Relative %: 57.1 % (ref 43.0–77.0)
Platelets: 333 10*3/uL (ref 150.0–400.0)
RBC: 4.4 Mil/uL (ref 3.87–5.11)
RDW: 13 % (ref 11.5–15.5)
WBC: 7.2 10*3/uL (ref 4.0–10.5)

## 2020-02-02 LAB — COMPREHENSIVE METABOLIC PANEL
ALT: 18 U/L (ref 0–35)
AST: 28 U/L (ref 0–37)
Albumin: 4.2 g/dL (ref 3.5–5.2)
Alkaline Phosphatase: 40 U/L (ref 39–117)
BUN: 13 mg/dL (ref 6–23)
CO2: 28 mEq/L (ref 19–32)
Calcium: 9.4 mg/dL (ref 8.4–10.5)
Chloride: 103 mEq/L (ref 96–112)
Creatinine, Ser: 0.8 mg/dL (ref 0.40–1.20)
GFR: 79.01 mL/min (ref >60.00)
Glucose, Bld: 71 mg/dL (ref 70–99)
Potassium: 4.1 mEq/L (ref 3.5–5.1)
Sodium: 137 mEq/L (ref 135–145)
Total Bilirubin: 0.9 mg/dL (ref 0.2–1.2)
Total Protein: 7.1 g/dL (ref 6.0–8.3)

## 2020-02-02 LAB — IBC PANEL
Iron: 161 ug/dL — ABNORMAL HIGH (ref 42–145)
Saturation Ratios: 42 % (ref 20.0–50.0)
Transferrin: 274 mg/dL (ref 212.0–360.0)

## 2020-02-02 LAB — SEDIMENTATION RATE: Sed Rate: 13 mm/hr (ref 0–30)

## 2020-02-02 LAB — C-REACTIVE PROTEIN: CRP: <1 mg/dL (ref 0.5–20.0)

## 2020-02-02 LAB — VITAMIN D 25 HYDROXY (VIT D DEFICIENCY, FRACTURES): VITD: 22.87 ng/mL — ABNORMAL LOW (ref 30.00–100.00)

## 2020-02-02 LAB — TSH: TSH: 2.14 u[IU]/mL (ref 0.35–4.50)

## 2020-02-02 LAB — URIC ACID: Uric Acid, Serum: 3.5 mg/dL (ref 2.4–7.0)

## 2020-02-02 MED ORDER — GABAPENTIN 100 MG PO CAPS: 200.0000 mg | ORAL_CAPSULE | Freq: Every day | ORAL | 0 refills | Status: DC

## 2020-02-02 NOTE — Patient Instructions (Signed)
Labs today Increase gabapentin to 300mg  at night Try another epidural See me again in 6-8 weeks

## 2020-02-02 NOTE — Assessment & Plan Note (Signed)
Patient did have epidural but only had 1 day to maybe 2 days of relief and then the pain came back again.  Patient does have the C5-C6 left-sided impingement that would be consistent with some of patient's symptoms.  Patient does respond well though to osteopathic manipulation.  Increase gabapentin to 300 mg secondary to continuing to have this chronic problem.  Patient has meloxicam for breakthrough pain.  Follow-up with me again 6 weeks

## 2020-02-02 NOTE — Assessment & Plan Note (Signed)
Patient has had a 20 pound weight loss over the course of the last year.  Patient states she does not know why.  We will get laboratory work-up to rule out anything else that could be contributing.

## 2020-02-02 NOTE — Progress Notes (Signed)
Veronica [name] Phone: [phone] Subjective:   Veronica [name], am serving as a scribe for Dr. Hulan Saas. This visit occurred during the SARS-CoV-2 public health emergency.  Safety protocols were in place, including screening questions prior to the visit, additional usage of staff PPE, and extensive cleaning of exam room while observing appropriate contact time as indicated for disinfecting solutions.   I'm seeing this patient by the request  of:  Veronica Koch, NP  CC: Neck pain follow-up  [id number]  Veronica [name] is a 64 y.o. female coming in with complaint of back and neck pain. OMT 12/15/2019. Patient states she had epidural on 12/30/2019. Gave her one day of relief. Still has pain in left proximal trap.  Patient has known degenerative disc disease with a C5-C6 nerve root impingement on the right side.  Medications patient has been prescribed: Gabapentin  Taking: Yes         Reviewed prior external information including notes and imaging from previsou exam, outside providers and external EMR if available.   As well as notes that were available from care everywhere and other healthcare systems.  Past medical history, social, surgical and family history all reviewed in electronic medical record.  [name]   Past Medical History:  Diagnosis Date  . Depression   . Dry eyes     [name]   Review of Systems:  [name], visual changes, nausea, vomiting, diarrhea, constipation, dizziness, abdominal pain, skin rash, fevers, chills, night sweats, weight loss, swollen lymph nodes, body aches, joint swelling, chest pain, shortness of breath, mood changes. POSITIVE muscle aches  Objective  Blood pressure 122/78, pulse 73, height 5\' 2"  (1.575 m), weight 133 lb (60.3 kg), SpO2 99 %.   General: [name] apparent distress alert and  oriented x3 mood and affect normal, dressed appropriately.  HEENT: Pupils equal, extraocular movements intact  Respiratory: Patient's speak in full sentences and does not appear short of breath  Cardiovascular: [name], non tender, [name]  Neuro: Cranial nerves II through XII are intact, neurovascularly intact in all extremities with 2+ DTRs and 2+ pulses.  Gait normal with good balance and coordination.  MSK:  Non tender with full range of motion and good stability and symmetric strength and tone of shoulders, elbows, wrist, hip, knee and ankles bilaterally.  Patient neck exam does have loss of lordosis.  Tenderness to palpation in the paraspinal musculature.  More tightness noted in the left trapezius on the right.  More tightness in the left parascapular region as well.  Osteopathic findings C6 flexed rotated and side bent left T3 extended rotated and side bent left inhaled rib T9 extended rotated and side bent left        Assessment and Plan:    Nonallopathic problems  Decision today to treat with OMT was based on Physical Exam  After verbal consent patient was treated with HVLA, ME, FPR techniques in cervical, rib, thoracic, lumbar, and sacral  areas  Patient tolerated the procedure well with improvement in symptoms  Patient given exercises, stretches and lifestyle modifications  See medications in patient instructions if given  Patient will follow up in 4-8 weeks      The above documentation has been reviewed and is accurate and complete [name], DO       Note: This dictation was prepared with Dragon dictation along  with smaller phrase technology. Any transcriptional errors that result from this process are unintentional.

## 2020-02-03 LAB — RHEUMATOID FACTOR: Rheumatoid fact SerPl-aCnc: <14 IU/mL (ref <14)

## 2020-02-03 LAB — CALCIUM, IONIZED: Calcium, Ion: 5 mg/dL (ref 4.8–5.6)

## 2020-02-03 LAB — PTH, INTACT AND CALCIUM
Calcium: 9.4 mg/dL (ref 8.6–10.4)
PTH: 32 pg/mL (ref 14–64)

## 2020-02-03 LAB — CYCLIC CITRUL PEPTIDE ANTIBODY, IGG: Cyclic Citrullin Peptide Ab: <16 UNITS

## 2020-02-12 ENCOUNTER — Encounter: Payer: Self-pay | Admitting: Family Medicine

## 2020-02-18 ENCOUNTER — Other Ambulatory Visit: Payer: Self-pay | Admitting: Primary Care

## 2020-02-18 DIAGNOSIS — Z1231 Encounter for screening mammogram for malignant neoplasm of breast: Secondary | ICD-10-CM

## 2020-03-01 ENCOUNTER — Other Ambulatory Visit: Payer: Self-pay

## 2020-03-01 ENCOUNTER — Ambulatory Visit
Admission: RE | Admit: 2020-03-01 | Discharge: 2020-03-01 | Disposition: A | Discharge status: Home / Self Care | Payer: 59 | Source: Ambulatory Visit | Attending: Family Medicine | Admitting: Family Medicine

## 2020-03-01 DIAGNOSIS — M542 Cervicalgia: Secondary | ICD-10-CM

## 2020-03-01 MED ORDER — TRIAMCINOLONE ACETONIDE 40 MG/ML IJ SUSP (RADIOLOGY)
60.0000 mg | Freq: Once | INTRAMUSCULAR | Status: AC
  Administered 2020-03-01: 60 mg via EPIDURAL

## 2020-03-01 MED ORDER — IOPAMIDOL (ISOVUE-M 300) INJECTION 61%
1.0000 mL | Freq: Once | INTRAMUSCULAR | Status: AC | PRN
  Administered 2020-03-01: 1 mL via EPIDURAL

## 2020-03-01 NOTE — Discharge Instructions (Signed)

## 2020-03-15 ENCOUNTER — Encounter: Payer: Self-pay | Admitting: Family Medicine

## 2020-03-15 ENCOUNTER — Ambulatory Visit: Payer: 59 | Admitting: Family Medicine

## 2020-03-15 ENCOUNTER — Other Ambulatory Visit: Payer: Self-pay

## 2020-03-15 VITALS — BP 100/70 | HR 83 | Ht 62.0 in | Wt 135.0 lb

## 2020-03-15 DIAGNOSIS — M2550 Pain in unspecified joint: Secondary | ICD-10-CM

## 2020-03-15 DIAGNOSIS — M999 Biomechanical lesion, unspecified: Secondary | ICD-10-CM | POA: Diagnosis not present

## 2020-03-15 DIAGNOSIS — M5030 Other cervical disc degeneration, unspecified cervical region: Secondary | ICD-10-CM | POA: Diagnosis not present

## 2020-03-15 NOTE — Patient Instructions (Addendum)
Lab today Exercises 3x a week See me in 7-8 weeks

## 2020-03-15 NOTE — Progress Notes (Signed)
[place] [address] [place] Phone: [phone] Subjective:   I Veronica [name] am serving as a Education administrator for Dr. Hulan Saas.  This visit occurred during the SARS-CoV-2 public health emergency.  Safety protocols were in place, including screening questions prior to the visit, additional usage of staff PPE, and extensive cleaning of exam room while observing appropriate contact time as indicated for disinfecting solutions.   I'm seeing this patient by the request  of:  [name], NP  CC: neck pain follow up   [id number]  Veronica [name] is a 64 y.o. female coming in with complaint of back and neck pain. OMT 02/02/2020. Patient states she is not doing too bad today. Had epidural 11/30 and states the radiation down the arm and is improved.   Medications patient has been prescribed: Gabapentin, meloxicam  Taking: Yes         Reviewed prior external information including notes and imaging from previsou exam, outside providers and external EMR if available.   As well as notes that were available from care everywhere and other healthcare systems.  Past medical history, social, surgical and family history all reviewed in electronic medical record.  No pertanent information unless stated regarding to the chief complaint.   Past Medical History:  Diagnosis Date  . Depression   . Dry eyes     No Known Allergies   Review of Systems:  No  visual changes, nausea, vomiting, diarrhea, constipation, dizziness, abdominal pain, skin rash, fevers, chills, night sweats, weight loss, swollen lymph nodes, body aches, joint swelling, chest pain, shortness of breath, mood changes. POSITIVE muscle aches, headache  Objective  Blood pressure 100/70, pulse 83, height 5\' 2"  (1.575 m), weight 135 lb (61.2 kg), SpO2 98 %.   General: No apparent distress alert and oriented x3 mood and affect normal, dressed appropriately.  HEENT: Pupils equal,  extraocular movements intact  Respiratory: Patient's speak in full sentences and does not appear short of breath  Cardiovascular: No lower extremity edema, non tender, no erythema  Neuro: Cranial nerves II through XII are intact, neurovascularly intact in all extremities with 2+ DTRs and 2+ pulses.  Gait normal with good balance and coordination.  MSK:  Non tender with full range of motion and good stability and symmetric strength and tone of shoulders, elbows, wrist, hip, knee and ankles bilaterally.  Neck exam does have some loss of lordosis.  Some tenderness to palpation in the paraspinal musculature decreased sidebending and extension of at least 5 degrees in all planes.  Negative Spurling's on the right which is somewhat of an improvement.  5-5 strength of the upper extremities.  Mild crepitus with range of motion  Osteopathic findings  C2 flexed rotated and side bent right C6 flexed rotated and side bent left T5 extended rotated and side bent right inhaled rib        Assessment and Plan:  Degenerative disc disease, cervical Patient did have a repeat injection.  Did help her with the radicular pain on the right side.  Patient continues to have neck pain.  Wants to avoid any surgical intervention.  Patient is on gabapentin we discussed other possibilities as well.  Patient responds very well to osteopathic manipulation at this time.  Focus more on muscle energy.  Follow-up with me again 6 to 8 weeks    Nonallopathic problems  Decision today to treat with OMT was based on Physical Exam  After verbal consent patient was  treated with  ME, FPR techniques in cervical, rib, thoracic areas  Patient tolerated the procedure well with improvement in symptoms  Patient given exercises, stretches and lifestyle modifications  See medications in patient instructions if given  Patient will follow up in 6-8 weeks      The above documentation has been reviewed and is accurate and complete  [name], DO       Note: This dictation was prepared with Dragon dictation along with smaller phrase technology. Any transcriptional errors that result from this process are unintentional.

## 2020-03-16 ENCOUNTER — Encounter: Payer: Self-pay | Admitting: Family Medicine

## 2020-03-16 LAB — SARS-COV-2 ANTIBODY(IGG)SPIKE,SEMI-QUANTITATIVE: SARS COV1 AB(IGG)SPIKE,SEMI QN: 8.32 index — ABNORMAL HIGH (ref <1.00)

## 2020-03-16 NOTE — Assessment & Plan Note (Signed)
Patient did have a repeat injection.  Did help her with the radicular pain on the right side.  Patient continues to have neck pain.  Wants to avoid any surgical intervention.  Patient is on gabapentin we discussed other possibilities as well.  Patient responds very well to osteopathic manipulation at this time.  Focus more on muscle energy.  Follow-up with me again 6 to 8 weeks

## 2020-04-12 ENCOUNTER — Other Ambulatory Visit: Payer: Self-pay

## 2020-04-12 ENCOUNTER — Ambulatory Visit
Admission: RE | Admit: 2020-04-12 | Discharge: 2020-04-12 | Disposition: A | Discharge status: Home / Self Care | Payer: 59 | Source: Ambulatory Visit | Attending: Primary Care | Admitting: Primary Care

## 2020-04-12 DIAGNOSIS — Z1231 Encounter for screening mammogram for malignant neoplasm of breast: Secondary | ICD-10-CM | POA: Insufficient documentation

## 2020-04-13 ENCOUNTER — Encounter: Payer: Self-pay | Admitting: Family Medicine

## 2020-04-22 NOTE — Progress Notes (Deleted)
  [place] Phone: [phone] Subjective:    I'm seeing this patient by the request  of:  [name], NP  CC:   NID:[id number]  Veronica [name] is a 65 y.o. female coming in with complaint of back and neck pain. OMT 03/15/2020. Patient states   Medications patient has been prescribed: Gabapentin  Taking:         Reviewed prior external information including notes and imaging from previsou exam, outside providers and external EMR if available.   As well as notes that were available from care everywhere and other healthcare systems.  Past medical history, social, surgical and family history all reviewed in electronic medical record.  No pertanent information unless stated regarding to the chief complaint.   Past Medical History:  Diagnosis Date  . Depression   . Dry eyes     No Known Allergies   Review of Systems:  No headache, visual changes, nausea, vomiting, diarrhea, constipation, dizziness, abdominal pain, skin rash, fevers, chills, night sweats, weight loss, swollen lymph nodes, body aches, joint swelling, chest pain, shortness of breath, mood changes. POSITIVE muscle aches  Objective  There were no vitals taken for this visit.   General: No apparent distress alert and oriented x3 mood and affect normal, dressed appropriately.  HEENT: Pupils equal, extraocular movements intact  Respiratory: Patient's speak in full sentences and does not appear short of breath  Cardiovascular: No lower extremity edema, non tender, no erythema  Neuro: Cranial nerves II through XII are intact, neurovascularly intact in all extremities with 2+ DTRs and 2+ pulses.  Gait normal with good balance and coordination.  MSK:  Non tender with full range of motion and good stability and symmetric strength and tone of shoulders, elbows, wrist, hip, knee and ankles bilaterally.  Back - Normal skin, Spine with normal  alignment and no deformity.  No tenderness to vertebral process palpation.  Paraspinous muscles are not tender and without spasm.   Range of motion is full at neck and lumbar sacral regions  Osteopathic findings  C2 flexed rotated and side bent right C6 flexed rotated and side bent left T3 extended rotated and side bent right inhaled rib T9 extended rotated and side bent left L2 flexed rotated and side bent right Sacrum right on right       Assessment and Plan:    Nonallopathic problems  Decision today to treat with OMT was based on Physical Exam  After verbal consent patient was treated with HVLA, ME, FPR techniques in cervical, rib, thoracic, lumbar, and sacral  areas  Patient tolerated the procedure well with improvement in symptoms  Patient given exercises, stretches and lifestyle modifications  See medications in patient instructions if given  Patient will follow up in 4-8 weeks      The above documentation has been reviewed and is accurate and complete Veronica [name]       Note: This dictation was prepared with Dragon dictation along with smaller phrase technology. Any transcriptional errors that result from this process are unintentional.

## 2020-04-23 ENCOUNTER — Encounter: Payer: Self-pay | Admitting: Family Medicine

## 2020-04-25 ENCOUNTER — Ambulatory Visit: Payer: 59 | Admitting: Family Medicine

## 2020-04-25 NOTE — Telephone Encounter (Signed)
Danton Clap, will you add her on for 1220 as a virtual for 01/25?

## 2020-04-26 ENCOUNTER — Telehealth (INDEPENDENT_AMBULATORY_CARE_PROVIDER_SITE_OTHER): Payer: 59 | Admitting: Primary Care

## 2020-04-26 VITALS — HR 68 | Temp 97.8°F | Ht 62.0 in | Wt 133.0 lb

## 2020-04-26 DIAGNOSIS — U071 COVID-19: Secondary | ICD-10-CM | POA: Insufficient documentation

## 2020-04-26 HISTORY — DX: COVID-19: U07.1

## 2020-04-26 MED ORDER — HYDROCOD POLST-CPM POLST ER 10-8 MG/5ML PO SUER: 5.0000 mL | Freq: Two times a day (BID) | ORAL | 0 refills | Status: DC | PRN

## 2020-04-26 NOTE — Assessment & Plan Note (Signed)
6 days of viral symptoms, positive for COVID-19. Discussed that azithromycin is an antibiotic and will not treat COVID-19. Fortunately she has been vaccinated, also seems to be improving. She appears stable for outpatient treatment.  We will treat her symptoms conservatively, I will provide her with a prescription of Tussionex to take at bedtime for cough.  She will update if symptoms do not continue to improve and/or worsen.

## 2020-04-26 NOTE — Telephone Encounter (Signed)
Called patient and will add.

## 2020-04-26 NOTE — Progress Notes (Signed)
Subjective:    Patient ID: Veronica [name], female    DOB: [birth year], 65 y.o.   MRN: [id number]  HPI  Virtual Visit via Video Note  I connected with Veronica [name] on 04/26/20 at  3:20 PM EST by a video enabled telemedicine application and verified that I am speaking with the correct person using two identifiers.  Location: Patient: Home Provider: Office Participants: Patient and myself   I discussed the limitations of evaluation and management by telemedicine and the availability of in person appointments. The patient expressed understanding and agreed to proceed.  History of Present Illness:  This visit occurred during the SARS-CoV-2 public health emergency.  Safety protocols were in place, including screening questions prior to the visit, additional usage of staff PPE, and extensive cleaning of exam room while observing appropriate contact time as indicated for disinfecting solutions.   Veronica [name] is a 65 year old female with a history of chronic migraines, anxiety and depression, chronic neck and back pain who presents today to discuss Covid-19 symptoms.   Diagnosed with Covid-19 three days ago. Her symptoms began six days ago with body aches. She then progressed with fatigue, fever, and cough.  Her most bothersome symptom now is cough, particularly at night.  She has not had a fever over the last 2 days. Overall she is feeling better than she was 2 days ago.  She's had two vaccinations for Covid-19. She's been taking Dayquil, Nyquil with some improvement.  She is wondering if she should have a Z-Pak preventatively to prevent pneumonia.   Observations/Objective:  Alert and oriented. Appears well, not sickly. No distress. Speaking in complete sentences. Dry cough during visit.  Assessment and Plan:  6 days of viral symptoms, positive for COVID-19. Discussed that azithromycin is an antibiotic and will not treat COVID-19. Fortunately she has been vaccinated, also seems to be  improving. She appears stable for outpatient treatment.  We will treat her symptoms conservatively, I will provide her with a prescription of Tussionex to take at bedtime for cough.  She will update if symptoms do not continue to improve and/or worsen.  Follow Up Instructions:  You may take the cough suppressant every 12 hours as needed for cough and rest. Caution this medication contains codeine which may cause drowsiness.   You can continue taking DayQuil during the day.  Please update me if anything changes.  It was a pleasure to see you today! Allie Bossier, NP-C    I discussed the assessment and treatment plan with the patient. The patient was provided an opportunity to ask questions and all were answered. The patient agreed with the plan and demonstrated an understanding of the instructions.   The patient was advised to call back or seek an in-person evaluation if the symptoms worsen or if the condition fails to improve as anticipated.    [name], NP    Review of Systems  Constitutional: Positive for fatigue. Negative for chills and fever.  HENT: Positive for congestion.   Respiratory: Positive for cough.        Past Medical History:  Diagnosis Date  . Depression   . Dry eyes      Social History   Socioeconomic History  . Marital status: Married    Spouse name: Not on file  . Number of children: Not on file  . Years of education: Not on file  . Highest education level: Not on file  Occupational History  . Not on file  Tobacco  Use  . Smoking status: Former Smoker    Quit date: 06/11/1988    Years since quitting: 31.8  . Smokeless tobacco: Never Used  Substance and Sexual Activity  . Alcohol use: Yes    Alcohol/week: 6.0 standard drinks    Types: 6 Glasses of wine per week  . Drug use: No  . Sexual activity: Not on file  Other Topics Concern  . Not on file  Social History Narrative   Married.   No children.   Works for [employer].   Enjoys showing and riding horses.    Social Determinants of Health   Financial Resource Strain: Not on file  Food Insecurity: Not on file  Transportation Needs: Not on file  Physical Activity: Not on file  Stress: Not on file  Social Connections: Not on file  Intimate Partner Violence: Not on file    Past Surgical History:  Procedure Laterality Date  . CLOSED REDUCTION HAND FRACTURE  2002   left  . LASIK      Family History  Problem Relation Age of Onset  . Hypertension Mother   . Heart disease Mother   . COPD Father   . Colon cancer Neg Hx     No Known Allergies  Current Outpatient Medications on File Prior to Visit  Medication Sig Dispense Refill  . buPROPion (WELLBUTRIN XL) 150 MG 24 hr tablet Take 1 tablet (150 mg total) by mouth every morning. For anxiety/depression. 90 tablet 3  . diazepam (VALIUM) 5 MG tablet One tab by mouth, 2 hours before procedure. 2 tablet 0  . Diclofenac Sodium (PENNSAID) 2 % SOLN Place 1 application onto the skin 2 (two) times daily. 1 Bottle 3  . gabapentin (NEURONTIN) 100 MG capsule Take 2 capsules (200 mg total) by mouth at bedtime. 180 capsule 0  . Ibuprofen-Famotidine 800-26.6 MG TABS Take 1 tablet by mouth 3 (three) times daily. 90 tablet 3  . meloxicam (MOBIC) 15 MG tablet TAKE 1 TABLET BY MOUTH EVERY DAY AS NEEDED 30 tablet 1  . SUMAtriptan-naproxen (TREXIMET) 85-500 MG tablet Take 1 tablet by mouth at migraine onset. May repeat in 2 hours if migraine persists, do not exceed 2 tablets in 24 hours. 10 tablet 0  . estrogen, conjugated,-medroxyprogesterone (PREMPRO) 0.3-1.5 MG tablet Take 1 tablet by mouth daily. (Patient not taking: Reported on 04/26/2020) 90 tablet 0   No current facility-administered medications on file prior to visit.    Pulse 68   Temp 97.8 F (36.6 C) (Oral)   Ht 5\' 2"  (1.575 m)   Wt 133 lb (60.3 kg)   SpO2 98%   BMI 24.33 kg/m    Objective:   Physical Exam Constitutional:      General: She is  not in acute distress.    Appearance: She is ill-appearing.  Pulmonary:     Effort: Pulmonary effort is normal.     Comments: Dry cough noted during visit Neurological:     Mental Status: She is alert and oriented to person, place, and time.  Psychiatric:        Mood and Affect: Mood normal.            Assessment & Plan:

## 2020-04-26 NOTE — Patient Instructions (Signed)
You may take the cough suppressant every 12 hours as needed for cough and rest. Caution this medication contains codeine which may cause drowsiness.   You can continue taking DayQuil during the day.  Please update me if anything changes.  It was a pleasure to see you today! Allie Bossier, NP-C

## 2020-05-03 ENCOUNTER — Ambulatory Visit: Payer: 59 | Admitting: Family Medicine

## 2020-05-10 ENCOUNTER — Encounter: Payer: Self-pay | Admitting: Family Medicine

## 2020-05-10 ENCOUNTER — Other Ambulatory Visit: Payer: Self-pay

## 2020-05-10 ENCOUNTER — Ambulatory Visit: Payer: 59 | Admitting: Family Medicine

## 2020-05-10 VITALS — BP 120/82 | HR 72 | Ht 62.0 in | Wt 138.0 lb

## 2020-05-10 DIAGNOSIS — M999 Biomechanical lesion, unspecified: Secondary | ICD-10-CM

## 2020-05-10 DIAGNOSIS — G5601 Carpal tunnel syndrome, right upper limb: Secondary | ICD-10-CM

## 2020-05-10 DIAGNOSIS — M5030 Other cervical disc degeneration, unspecified cervical region: Secondary | ICD-10-CM

## 2020-05-10 MED ORDER — PREDNISONE 20 MG PO TABS: ORAL_TABLET | ORAL | 0 refills | Status: DC

## 2020-05-10 MED ORDER — PREDNISONE 20 MG PO TABS: 20.0000 mg | ORAL_TABLET | Freq: Every day | ORAL | 0 refills | Status: DC

## 2020-05-10 NOTE — Patient Instructions (Addendum)
Good to see you  Prednisone 40mg  daily for five days Wear brace at night Exercises given  See me again in 4-5 weeks  If not better we can talk about injections

## 2020-05-10 NOTE — Assessment & Plan Note (Signed)
Patient did have a bruise on her forehead that she states happened about a week ago.  This could be more of an exacerbation of the neck as well.  Patient wants to hold on any other type of injections of the neck.  We will be doing the prednisone.  Once again difficult to assess if this is more secondary to carpal tunnel versus post viral versus cervical radiculopathy.  See how patient responds and will follow up again 4 to 6 weeks

## 2020-05-10 NOTE — Progress Notes (Signed)
[name] [place] Phone: [phone] Subjective:   Veronica [name], am serving as a scribe for Dr. Hulan Saas.  This visit occurred during the SARS-CoV-2 public health emergency.  Safety protocols were in place, including screening questions prior to the visit, additional usage of staff PPE, and extensive cleaning of exam room while observing appropriate contact time as indicated for disinfecting solutions.    I'm seeing this patient by the request  of:  [name], NP  CC: Hand numbness, chronic neck pain  [id number]  Veronica [name] is a 65 y.o. female coming in with complaint of hand numbness. Last seen for OMT on 03/15/2020. Cervical epidural 03/01/2020. Patient has tested positive for COVID since last visit. Patient states that her hands are numb, hurting, and keeping her up at night from the pain. States that this is the worst it has ever been and is wondering if COVID could it worse.  Patient would like her R side of her jaw looked at. She bit into a hard piece of candy and her jaw popped. Since then she can hear it clunking when she eats.      Was given a carpal tunnel injection in May 2021  MRI of the neck showed the patient does have moderate left foraminal stenosis at the C5-C6 level patient has undergone 2 epidurals last one in March 01, 2020  Past Medical History:  Diagnosis Date  . Depression   . Dry eyes    Past Surgical History:  Procedure Laterality Date  . CLOSED REDUCTION HAND FRACTURE  2002   left  . LASIK     Social History   Socioeconomic History  . Marital status: Married    Spouse name: Not on file  . Number of children: Not on file  . Years of education: Not on file  . Highest education level: Not on file  Occupational History  . Not on file  Tobacco Use  . Smoking status: Former Smoker    Quit date: 06/11/1988    Years since quitting: 31.9  . Smokeless tobacco:  Never Used  Substance and Sexual Activity  . Alcohol use: Yes    Alcohol/week: 6.0 standard drinks    Types: 6 Glasses of wine per week  . Drug use: No  . Sexual activity: Not on file  Other Topics Concern  . Not on file  Social History Narrative   Married.   No children.   Works for [employer].   Enjoys showing and riding horses.    Social Determinants of Health   Financial Resource Strain: Not on file  Food Insecurity: Not on file  Transportation Needs: Not on file  Physical Activity: Not on file  Stress: Not on file  Social Connections: Not on file   No Known Allergies Family History  Problem Relation Age of Onset  . Hypertension Mother   . Heart disease Mother   . COPD Father   . Colon cancer Neg Hx     Current Outpatient Medications (Endocrine & Metabolic):  .  predniSONE (DELTASONE) 20 MG tablet, Take 2 tablets by mouth daily with breakfast (40mg  total)    Current Outpatient Medications (Analgesics):  [name]  Ibuprofen-Famotidine 800-26.6 MG TABS, Take 1 tablet by mouth 3 (three) times daily. .  meloxicam (MOBIC) 15 MG tablet, TAKE 1 TABLET BY MOUTH EVERY DAY AS NEEDED .  SUMAtriptan-naproxen (TREXIMET) 85-500 MG tablet, Take 1 tablet by mouth  at migraine onset. May repeat in 2 hours if migraine persists, do not exceed 2 tablets in 24 hours.   Current Outpatient Medications (Other):  [name]  buPROPion (WELLBUTRIN XL) 150 MG 24 hr tablet, Take 1 tablet (150 mg total) by mouth every morning. For anxiety/depression. .  Diclofenac Sodium (PENNSAID) 2 % SOLN, Place 1 application onto the skin 2 (two) times daily. [name]  gabapentin (NEURONTIN) 100 MG capsule, Take 2 capsules (200 mg total) by mouth at bedtime.   Reviewed prior external information including notes and imaging from  primary care provider As well as notes that were available from care everywhere and other healthcare systems.  Past medical history, social, surgical and family history all reviewed in  electronic medical record.  No pertanent information unless stated regarding to the chief complaint.   Review of Systems:  No headache, visual changes, nausea, vomiting, diarrhea, constipation, dizziness, abdominal pain, skin rash, fevers, chills, night sweats, weight loss, swollen lymph nodes, body aches, joint swelling, chest pain, shortness of breath, mood changes. POSITIVE muscle aches  Objective  Blood pressure 120/82, pulse 72, height 5\' 2"  (1.575 m), weight 138 lb (62.6 kg), SpO2 98 %.   General: No apparent distress alert and oriented x3 mood and affect normal, dressed appropriately.  HEENT: Pupils equal, extraocular movements intact  Respiratory: Patient's speak in full sentences and does not appear short of breath  Cardiovascular: No lower extremity edema, non tender, no erythema  Gait normal with good balance and coordination.  MSK:  Neck exam does show some mild loss of lordosis.  Some tenderness to palpation of the paraspinal musculature of the lumbar spine.  No true radicular symptoms though with Spurling's today.  5 out of 5 strength of the upper extremities.  Wrist exam bilaterally shows the patient does have mild positive Tinel and Phalen's test.  Patient noted has good grip strength.  No weakness noted.  Osteopathic findings C7 flexed rotated and side bent right T4 extended rotated and side bent left inhaled rib   Impression and Recommendations:     The above documentation has been reviewed and is accurate and complete [name], DO

## 2020-05-10 NOTE — Assessment & Plan Note (Signed)
Difficult to assess if this is more secondary to the carpal tunnel, or patient cervical radiculopathy.  We discussed the potential for a nerve conduction study which patient declined.  Patient does have the gabapentin and can increase to 300 mg if necessary.  Discussed with patient was given prednisone 40 mg daily for the next 5 days.  Patient will stop the meloxicam or any other anti-inflammatories.  Increase activity afterwards.  Patient will come back again in 4 to 5 weeks and will consider injections if necessary.  Also would need to consider the possibility of the nerve conduction study.

## 2020-05-10 NOTE — Assessment & Plan Note (Signed)
   Decision today to treat with OMT was based on Physical Exam  After verbal consent patient was treated with HVLA, ME, FPR techniques in cervical, thoracic, rib,areas, all areas are chronic   Patient tolerated the procedure well with improvement in symptoms  Patient given exercises, stretches and lifestyle modifications  See medications in patient instructions if given  Patient will follow up in 4-8 weeks 

## 2020-05-25 ENCOUNTER — Encounter: Payer: Self-pay | Admitting: Family Medicine

## 2020-05-25 ENCOUNTER — Ambulatory Visit: Payer: 59 | Admitting: Family Medicine

## 2020-05-25 ENCOUNTER — Other Ambulatory Visit: Payer: Self-pay

## 2020-05-25 ENCOUNTER — Ambulatory Visit: Payer: Self-pay

## 2020-05-25 VITALS — BP 122/84 | HR 79 | Ht 62.0 in | Wt 135.0 lb

## 2020-05-25 DIAGNOSIS — G5601 Carpal tunnel syndrome, right upper limb: Secondary | ICD-10-CM

## 2020-05-25 DIAGNOSIS — M25531 Pain in right wrist: Secondary | ICD-10-CM | POA: Diagnosis not present

## 2020-05-25 NOTE — Progress Notes (Signed)
   I, Veronica [name], LAT, ATC acting as a scribe for Veronica Leader, MD.  Veronica [name] is a 65 y.o. female who presents to [facility] today for continued R carpal tunnel syndrome. Pt was last seen by Dr. Tamala Julian on 05/10/20 for cervical DJD. Pt was last given a R carpal tunnel steroid injection on 08/10/19 by Dr. Tamala Julian. Patient states that wrist pain is worse at night and is unable to wear brace due to amount of pain she is in. Today, pt reports that oral steroid did help to reduce pain but her pain is coming back. Patient is having right arm numbness as well. Did have some relief from C7-T1 epidural in November.   Numbness/tingling: entire hand Grip strength:  Aggravates: Driving,  Treatments tried: Gabapentin, night wrist splints  Pertinent review of systems: No fevers or chills  Relevant historical information: Prior carpal tunnel injection   Exam:  BP 122/84   Pulse 79   Ht 5\' 2"  (1.575 m)   Wt 135 lb (61.2 kg)   SpO2 97%   BMI 24.69 kg/m  General: Well Developed, well nourished, and in no acute distress.   MSK: Right hand normal. Nontender. Positive Tinel's carpal tunnel.  Normal grip strength.    Lab and Radiology Results  Procedure: Real-time Ultrasound Guided hydrodissection median nerve right carpal tunnel Device: Philips Affiniti 50G Images permanently stored and available for review in PACS Verbal informed consent obtained.  Discussed risks and benefits of procedure. Warned about infection bleeding damage to structures skin hypopigmentation and fat atrophy among others. Patient expresses understanding and agreement Time-out conducted.   Noted no overlying erythema, induration, or other signs of local infection.   Skin prepped in a sterile fashion.   Local anesthesia: Topical Ethyl chloride.   With sterile technique and under real time ultrasound guidance:  40 mg of Kenalog and 1 mL of lidocaine injected into the tissue surrounding median  nerve. Fluid seen entering the soft tissue carpal tunnel.   Completed without difficulty   Pain immediately resolved suggesting accurate placement of the medication.   Advised to call if fevers/chills, erythema, induration, drainage, or persistent bleeding.   Images permanently stored and available for review in the ultrasound unit.  Impression: Technically successful ultrasound guided injection.     Assessment and Plan: 65 y.o. female with right carpal tunnel syndrome.  Plan for median nerve hydrodissection.  Patient has follow-up appointment scheduled already with Dr. Tamala Julian in March.  Keep that appointment and recheck back with me as needed.  Recommend continue night splint.   PDMP not reviewed this encounter. Orders Placed This Encounter  Procedures  . [place] LIMITED JOINT SPACE STRUCTURES UP RIGHT(NO LINKED CHARGES)    Order Specific Question:   Reason for Exam (SYMPTOM  OR DIAGNOSIS REQUIRED)    Answer:   right wrist pain    Order Specific Question:   Preferred imaging location?    Answer:   [facility]   No orders of the defined types were placed in this encounter.    Discussed warning signs or symptoms. Please see discharge instructions. Patient expresses understanding.   The above documentation has been reviewed and is accurate and complete Veronica [name], M.D.

## 2020-05-25 NOTE — Patient Instructions (Addendum)
Thank you for coming in today.  You received a steroid injection today. Seek immediate medical attention if the joint becomes red, extremely painful, or is oozing fluid.  Keep the follow up with Dr Tamala Julian on the 15th.   Let me know sooner if you have a problem.

## 2020-06-14 ENCOUNTER — Other Ambulatory Visit: Payer: Self-pay

## 2020-06-14 ENCOUNTER — Encounter: Payer: Self-pay | Admitting: Family Medicine

## 2020-06-14 ENCOUNTER — Ambulatory Visit: Payer: 59 | Admitting: Family Medicine

## 2020-06-14 ENCOUNTER — Ambulatory Visit: Payer: Self-pay

## 2020-06-14 VITALS — BP 128/80 | HR 68 | Ht 62.0 in | Wt 134.0 lb

## 2020-06-14 DIAGNOSIS — M5030 Other cervical disc degeneration, unspecified cervical region: Secondary | ICD-10-CM | POA: Diagnosis not present

## 2020-06-14 DIAGNOSIS — G5602 Carpal tunnel syndrome, left upper limb: Secondary | ICD-10-CM | POA: Diagnosis not present

## 2020-06-14 DIAGNOSIS — M999 Biomechanical lesion, unspecified: Secondary | ICD-10-CM | POA: Diagnosis not present

## 2020-06-14 DIAGNOSIS — M25532 Pain in left wrist: Secondary | ICD-10-CM | POA: Diagnosis not present

## 2020-06-14 NOTE — Patient Instructions (Addendum)
Good to see you Injected left wrist today Neck is decent Try the braces at night See me again in 6-8 weeks

## 2020-06-14 NOTE — Assessment & Plan Note (Signed)
Patient given injection and tolerated the procedure well.  We discussed bracing at night, home exercises, continue the gabapentin.  Patient does have some neck pain that could be contributing as well and may consider a nerve conduction test if this continues.  Patient on ultrasound does have increased dilation of the nerve.  Follow-up with me again 6 to 8 weeks

## 2020-06-14 NOTE — Assessment & Plan Note (Signed)
Known degenerative with a left-sided nerve impingement.  Need to continue to monitor.  Discussed posture and ergonomics, which activities to do which wants to avoid.  Follow-up with me again in 6 to 8 weeks

## 2020-06-14 NOTE — Progress Notes (Signed)
[facility] [address] [place] Phone: [phone] Subjective:   I Veronica [name] am serving as a Education administrator for Dr. Hulan Saas.  This visit occurred during the SARS-CoV-2 public health emergency.  Safety protocols were in place, including screening questions prior to the visit, additional usage of staff PPE, and extensive cleaning of exam room while observing appropriate contact time as indicated for disinfecting solutions.   I'm seeing this patient by the request  of:  [name], NP  CC: back and neck pain   ATF:[id number]  Veronica [name] is a 65 y.o. female coming in with complaint of back and neck pain. OMT 05/10/2020. Patient states her neck is tight. Last visit she got right wrist injected and would like left injected today.  Tightness in the neck.  Continues to gabapentin.  Patient continues to be active.  Works with horses on a regular basis           Reviewed prior external information including notes and imaging from previsou exam, outside providers and external EMR if available.   As well as notes that were available from care everywhere and other healthcare systems.  Past medical history, social, surgical and family history all reviewed in electronic medical record.  No pertanent information unless stated regarding to the chief complaint.   Past Medical History:  Diagnosis Date  . Depression   . Dry eyes     No Known Allergies   Review of Systems:  No headache, visual changes, nausea, vomiting, diarrhea, constipation, dizziness, abdominal pain, skin rash, fevers, chills, night sweats, weight loss, swollen lymph nodes, body aches, joint swelling, chest pain, shortness of breath, mood changes. POSITIVE muscle aches  Objective  Blood pressure 128/80, pulse 68, height 5\' 2"  (1.575 m), weight 134 lb (60.8 kg), SpO2 98 %.   General: No apparent distress alert and oriented x3 mood and affect normal, dressed  appropriately.  HEENT: Pupils equal, extraocular movements intact  Respiratory: Patient's speak in full sentences and does not appear short of breath  Cardiovascular: No lower extremity edema, non tender, no erythema  Gait normal with good balance and coordination.  MSK: Patient does have some mild arthritic changes of the hands bilaterally.  Positive Tinel's on the left side.  Patient does also have a positive Phalen's test.  Good grip strength noted.  Mild arthritic changes of the CMC joint.   Osteopathic findings  C6 flexed rotated and side bent left T3 extended rotated and side bent left inhaled rib  Procedure: Real-time Ultrasound Guided Injection of left carpal tunnel Device: GE Logiq Q7 Ultrasound guided injection is preferred based studies that show increased duration, increased effect, greater accuracy, decreased procedural pain, increased response rate with ultrasound guided versus blind injection.  Verbal informed consent obtained.  Time-out conducted.  Noted no overlying erythema, induration, or other signs of local infection.  Skin prepped in a sterile fashion.  Local anesthesia: Topical Ethyl chloride.  With sterile technique and under real time ultrasound guidance:  median nerve visualized.  23g 5/8 inch needle inserted distal to proximal approach into nerve sheath. Pictures taken nfor needle placement. Patient did have injection of 0.5 cc of 0.5% Marcaine, and 0.5 cc of Kenalog 40 mg/dL. Completed without difficulty  Pain immediately resolved suggesting accurate placement of the medication.  Advised to call if fevers/chills, erythema, induration, drainage, or persistent bleeding.  Impression: Technically successful ultrasound guided injection.    Assessment and Plan:  Left carpal tunnel  syndrome Patient given injection and tolerated the procedure well.  We discussed bracing at night, home exercises, continue the gabapentin.  Patient does have some neck pain that could  be contributing as well and may consider a nerve conduction test if this continues.  Patient on ultrasound does have increased dilation of the nerve.  Follow-up with me again 6 to 8 weeks  Degenerative disc disease, cervical Known degenerative with a left-sided nerve impingement.  Need to continue to monitor.  Discussed posture and ergonomics, which activities to do which wants to avoid.  Follow-up with me again in 6 to 8 weeks    Nonallopathic problems  Decision today to treat with OMT was based on Physical Exam  After verbal consent patient was treated with  ME, FPR techniques in cervical, rib, thoracic,  areas  Patient tolerated the procedure well with improvement in symptoms  Patient given exercises, stretches and lifestyle modifications  See medications in patient instructions if given  Patient will follow up in 4-8 weeks      The above documentation has been reviewed and is accurate and complete [name], DO       Note: This dictation was prepared with Dragon dictation along with smaller phrase technology. Any transcriptional errors that result from this process are unintentional.

## 2020-06-18 ENCOUNTER — Other Ambulatory Visit: Payer: Self-pay | Admitting: Family Medicine

## 2020-07-26 NOTE — Progress Notes (Signed)
Veronica [name] Phone: [phone] Subjective:   Veronica [name], am serving as a scribe for Dr. Hulan Saas. This visit occurred during the SARS-CoV-2 public health emergency.  Safety protocols were in place, including screening questions prior to the visit, additional usage of staff PPE, and extensive cleaning of exam room while observing appropriate contact time as indicated for disinfecting solutions.   I'm seeing this patient by the request  of:  Veronica Koch, NP  CC: Neck and back pain follow-up  [id number]  Veronica [name] is a 65 y.o. female coming in with complaint of back and neck pain. OMT 06/14/2020 as well as f/u for L wrist pain. Injected carpal tunnel last visit.  Patient states that she did have relief but she fell yesterday. Pain increased in wrist. Achy pain in L knee and R hand. Back and neck pain are the same as last visit.   Physical therapy states that insurance will [name] longer cover her appointments. Wants to know if there is anything she can do to continue once a week.   Medications patient has been prescribed: Gabapentin  Taking: Yes         Reviewed prior external information including notes and imaging from previsou exam, outside providers and external EMR if available.   As well as notes that were available from care everywhere and other healthcare systems.  Past medical history, social, surgical and family history all reviewed in electronic medical record.  [name]   Past Medical History:  Diagnosis Date  . Depression   . Dry eyes     [name]   Review of Systems:  [name], visual changes, nausea, vomiting, diarrhea, constipation, dizziness, abdominal pain, skin rash, fevers, chills, night sweats, weight loss, swollen lymph nodes,  joint swelling, chest pain, shortness of breath, mood changes.  POSITIVE muscle aches, body aches  Objective  Blood pressure 120/90, pulse 75, height 5\' 2"  (1.575 m), weight 136 lb (61.7 kg), SpO2 99 %.   General: [name], dressed appropriately.  HEENT: Pupils equal, extraocular movements intact  Respiratory: Patient's speak in full sentences and does not appear short of breath  Cardiovascular: [name], non tender, [name]  Gait normal with good balance and coordination.  Back -low back exam does have some mild loss of lordosis.  Some tenderness to palpation in the paraspinal musculature of the lumbar spine.  Patient does have pain in the thoracolumbar juncture noted as well.  Significant tightness of the neck with sidebending bilaterally.  Negative Spurling's to be related.  5 out of 5 strength of the upper extremities.  Osteopathic findings  C2 flexed rotated and side bent right C6 flexed rotated and side bent left T3 extended rotated and side bent right inhaled rib T9 extended rotated and side bent left L2 flexed rotated and side bent right Sacrum right on right       Assessment and Plan:  Degenerative disc disease, cervical Moderate to severe arthritic changes noted.  We discussed with patient again about the possibility of repeating epidural if needed.  Responding relatively well though to osteopathic manipulation.  Continuing the gabapentin when needed as well as has the meloxicam.  Discussed posture and ergonomics.  Patient is staying very active.  Follow-up with me again 6 to 8 weeks  Left carpal tunnel syndrome  Improved after injection.  Did have a fall but is doing relatively well.    Nonallopathic problems  Decision today to treat with OMT was based on Physical Exam  After verbal consent patient was treated with HVLA, ME, FPR techniques in cervical, rib, thoracic  areas  Patient tolerated the procedure well with improvement in symptoms  Patient  given exercises, stretches and lifestyle modifications  See medications in patient instructions if given  Patient will follow up in 4-8 weeks      The above documentation has been reviewed and is accurate and complete [name], DO       Note: This dictation was prepared with Dragon dictation along with smaller phrase technology. Any transcriptional errors that result from this process are unintentional.

## 2020-07-27 ENCOUNTER — Ambulatory Visit: Payer: 59 | Admitting: Family Medicine

## 2020-07-27 ENCOUNTER — Encounter: Payer: Self-pay | Admitting: Family Medicine

## 2020-07-27 ENCOUNTER — Other Ambulatory Visit: Payer: Self-pay

## 2020-07-27 VITALS — BP 120/90 | HR 75 | Ht 62.0 in | Wt 136.0 lb

## 2020-07-27 DIAGNOSIS — M5030 Other cervical disc degeneration, unspecified cervical region: Secondary | ICD-10-CM | POA: Diagnosis not present

## 2020-07-27 DIAGNOSIS — M9902 Segmental and somatic dysfunction of thoracic region: Secondary | ICD-10-CM

## 2020-07-27 DIAGNOSIS — M9901 Segmental and somatic dysfunction of cervical region: Secondary | ICD-10-CM | POA: Diagnosis not present

## 2020-07-27 DIAGNOSIS — M9908 Segmental and somatic dysfunction of rib cage: Secondary | ICD-10-CM | POA: Diagnosis not present

## 2020-07-27 DIAGNOSIS — G5602 Carpal tunnel syndrome, left upper limb: Secondary | ICD-10-CM

## 2020-07-27 NOTE — Assessment & Plan Note (Signed)
Moderate to severe arthritic changes noted.  We discussed with patient again about the possibility of repeating epidural if needed.  Responding relatively well though to osteopathic manipulation.  Continuing the gabapentin when needed as well as has the meloxicam.  Discussed posture and ergonomics.  Patient is staying very active.  Follow-up with me again 6 to 8 weeks

## 2020-07-27 NOTE — Assessment & Plan Note (Signed)
Improved after injection.  Did have a fall but is doing relatively well.

## 2020-07-27 NOTE — Patient Instructions (Signed)
Arnica lotion to knee See me in 6-8 weeks

## 2020-08-31 ENCOUNTER — Encounter: Payer: Self-pay | Admitting: Family Medicine

## 2020-08-31 ENCOUNTER — Other Ambulatory Visit: Payer: Self-pay

## 2020-08-31 MED ORDER — GABAPENTIN 100 MG PO CAPS: 200.0000 mg | ORAL_CAPSULE | Freq: Every day | ORAL | 0 refills | Status: DC

## 2020-09-01 ENCOUNTER — Other Ambulatory Visit: Payer: Self-pay

## 2020-09-01 DIAGNOSIS — F419 Anxiety disorder, unspecified: Secondary | ICD-10-CM

## 2020-09-01 DIAGNOSIS — F32A Depression, unspecified: Secondary | ICD-10-CM

## 2020-09-01 MED ORDER — BUPROPION HCL ER (XL) 150 MG PO TB24: 150.0000 mg | ORAL_TABLET | Freq: Every morning | ORAL | 0 refills | Status: DC

## 2020-09-01 NOTE — Telephone Encounter (Signed)
Ok to refill 

## 2020-09-01 NOTE — Telephone Encounter (Signed)
Called patient 30 day supply called in so her insurance will not give her a hard time and not want to pay for rest of month. She will call if any questions

## 2020-09-07 NOTE — Progress Notes (Signed)
[place] Phone: [phone] Subjective:   [name], am serving as a scribe for Dr. Hulan Saas.  This visit occurred during the SARS-CoV-2 public health emergency.  Safety protocols were in place, including screening questions prior to the visit, additional usage of staff PPE, and extensive cleaning of exam room while observing appropriate contact time as indicated for disinfecting solutions.    I'm seeing this patient by the request  of:  [name], NP  CC: back and neck pain   [id number]  Veronica [name] is a 65 y.o. female coming in with complaint of back and neck pain. OMT 07/27/2020. Patient states that she is having L hand and elbow numbness that was exacerbated by a fall. Received injection of R wrist by Dr. Georgina Snell in February 2022 and L carpal tunnel injection by Dr. Tamala Julian in March 2022.   Medications patient has been prescribed: Gabapentin  Taking:         Reviewed prior external information including notes and imaging from previsou exam, outside providers and external EMR if available.   As well as notes that were available from care everywhere and other healthcare systems.  Past medical history, social, surgical and family history all reviewed in electronic medical record.  No pertanent information unless stated regarding to the chief complaint.   Past Medical History:  Diagnosis Date   Depression    Dry eyes     No Known Allergies   Review of Systems:  No headache, visual changes, nausea, vomiting, diarrhea, constipation, dizziness, abdominal pain, skin rash, fevers, chills, night sweats, weight loss, swollen lymph nodes, body aches, joint swelling, chest pain, shortness of breath, mood changes. POSITIVE muscle aches  Objective  Blood pressure 112/68, pulse 64, height 5\' 2"  (1.575 m), weight 134 lb (60.8 kg), SpO2 98 %.   General: No apparent distress alert and oriented  x3 mood and affect normal, dressed appropriately.  HEENT: Pupils equal, extraocular movements intact  Respiratory: Patient's speak in full sentences and does not appear short of breath  Cardiovascular: No lower extremity edema, non tender, no erythema  Low back exam does have some mild loss of lordosis.  Some very mild tenderness noted on bilateral paraspinal musculature of the lumbar spine.  Lacks last 5 degrees of extension.  Patient does have tightness in the thoracolumbar juncture as well.  Patient's neck exam does have tightness with sidebending and does have some limited loss of lordosis of the cervical spine.  5 out of 5 strength of the upper extremities.  Crepitus noted with range of motion of the neck.  Left wrist exam shows the patient does have a positive Tinel's sign noted.  Positive Phalen's as well.  Good grip strength is still noted.  Osteopathic findings  C6 flexed rotated and side bent left T3 extended rotated and side bent right inhaled rib T9 extended rotated and side bent left L2 flexed rotated and side bent right Sacrum right on right   Procedure: Real-time Ultrasound Guided Injection of left carpal tunnel Device: GE Logiq Q7 Ultrasound guided injection is preferred based studies that show increased duration, increased effect, greater accuracy, decreased procedural pain, increased response rate with ultrasound guided versus blind injection.  Verbal informed consent obtained.  Time-out conducted.  Noted no overlying erythema, induration, or other signs of local infection.  Skin prepped in a sterile fashion.  Local anesthesia: Topical Ethyl chloride.  With sterile technique and under real  time ultrasound guidance:  median nerve visualized.  23g 5/8 inch needle inserted distal to proximal approach into nerve sheath. Pictures taken nfor needle placement. Patient did have injection of 0.5 cc of 0.5% Marcaine, and 0.5 cc of Kenalog 40 mg/dL. Completed without difficulty   Pain immediately resolved suggesting accurate placement of the medication.  Advised to call if fevers/chills, erythema, induration, drainage, or persistent bleeding.  Impression: Technically successful ultrasound guided injection.    Assessment and Plan:  No problem-specific Assessment & Plan notes found for this encounter.   Nonallopathic problems  Decision today to treat with OMT was based on Physical Exam  After verbal consent patient was treated with HVLA, ME, FPR techniques in cervical, rib, thoracic, lumbar, and sacral  areas  Patient tolerated the procedure well with improvement in symptoms  Patient given exercises, stretches and lifestyle modifications  See medications in patient instructions if given  Patient will follow up in 4-8 weeks     The above documentation has been reviewed and is accurate and complete [name], DO        Note: This dictation was prepared with Dragon dictation along with smaller phrase technology. Any transcriptional errors that result from this process are unintentional.

## 2020-09-08 ENCOUNTER — Ambulatory Visit: Payer: 59 | Admitting: Family Medicine

## 2020-09-08 ENCOUNTER — Other Ambulatory Visit: Payer: Self-pay

## 2020-09-08 ENCOUNTER — Encounter: Payer: Self-pay | Admitting: Family Medicine

## 2020-09-08 ENCOUNTER — Ambulatory Visit: Payer: Self-pay

## 2020-09-08 VITALS — BP 112/68 | HR 64 | Ht 62.0 in | Wt 134.0 lb

## 2020-09-08 DIAGNOSIS — M549 Dorsalgia, unspecified: Secondary | ICD-10-CM

## 2020-09-08 DIAGNOSIS — M9903 Segmental and somatic dysfunction of lumbar region: Secondary | ICD-10-CM

## 2020-09-08 DIAGNOSIS — G5602 Carpal tunnel syndrome, left upper limb: Secondary | ICD-10-CM

## 2020-09-08 DIAGNOSIS — M9904 Segmental and somatic dysfunction of sacral region: Secondary | ICD-10-CM

## 2020-09-08 DIAGNOSIS — M9901 Segmental and somatic dysfunction of cervical region: Secondary | ICD-10-CM

## 2020-09-08 DIAGNOSIS — M9908 Segmental and somatic dysfunction of rib cage: Secondary | ICD-10-CM

## 2020-09-08 DIAGNOSIS — M25532 Pain in left wrist: Secondary | ICD-10-CM

## 2020-09-08 DIAGNOSIS — M542 Cervicalgia: Secondary | ICD-10-CM

## 2020-09-08 DIAGNOSIS — G8929 Other chronic pain: Secondary | ICD-10-CM

## 2020-09-08 DIAGNOSIS — M9902 Segmental and somatic dysfunction of thoracic region: Secondary | ICD-10-CM

## 2020-09-08 NOTE — Assessment & Plan Note (Signed)
Continues to respond fairly well though to osteopathic manipulation.  Discussed icing regimen and home exercises, which activities to do which wants to avoid.  Patient will increase activity slowly.  Follow-up with me again in 6 to 8 weeks continue on the gabapentin and has meloxicam if necessary.

## 2020-09-08 NOTE — Assessment & Plan Note (Signed)
Repeat injection given today.  Tolerated the procedure well.  Discussed icing regimen and home exercise, discussed which activities to do which wants to avoid.  Increase activity slowly.  If pain comes back I do think patient needs to consider the possibility of nerve conduction study and surgical intervention.  Patient is in agreement with the plan

## 2020-09-08 NOTE — Patient Instructions (Addendum)
Injected L wrist today If it comes back soon, consider nerve conduction test For neck and back, see me in 5 weeks

## 2020-09-24 ENCOUNTER — Other Ambulatory Visit: Payer: Self-pay | Admitting: Family Medicine

## 2020-09-24 DIAGNOSIS — F32A Depression, unspecified: Secondary | ICD-10-CM

## 2020-09-30 NOTE — Telephone Encounter (Signed)
Patient overdue for follow up. Can we get her in?

## 2020-10-04 ENCOUNTER — Other Ambulatory Visit: Payer: Self-pay

## 2020-10-04 ENCOUNTER — Ambulatory Visit: Payer: 59 | Admitting: Family Medicine

## 2020-10-04 ENCOUNTER — Encounter: Payer: Self-pay | Admitting: Family Medicine

## 2020-10-04 VITALS — BP 102/80 | HR 68 | Ht 62.0 in | Wt 133.0 lb

## 2020-10-04 DIAGNOSIS — M9901 Segmental and somatic dysfunction of cervical region: Secondary | ICD-10-CM | POA: Diagnosis not present

## 2020-10-04 DIAGNOSIS — M9902 Segmental and somatic dysfunction of thoracic region: Secondary | ICD-10-CM

## 2020-10-04 DIAGNOSIS — M9904 Segmental and somatic dysfunction of sacral region: Secondary | ICD-10-CM

## 2020-10-04 DIAGNOSIS — M9903 Segmental and somatic dysfunction of lumbar region: Secondary | ICD-10-CM

## 2020-10-04 DIAGNOSIS — M9908 Segmental and somatic dysfunction of rib cage: Secondary | ICD-10-CM | POA: Diagnosis not present

## 2020-10-04 DIAGNOSIS — M5030 Other cervical disc degeneration, unspecified cervical region: Secondary | ICD-10-CM

## 2020-10-04 NOTE — Telephone Encounter (Signed)
Mail box full not able to leave message.

## 2020-10-04 NOTE — Assessment & Plan Note (Signed)
Patient does have degenerative disc disease of the cervical spine.  Discussed icing regimen and home exercises.  Discussed which I can also do which wants to avoid.  Patient has done relatively well.  No significant Chest wall.  Discussed the potential for x-rays which patient declined.  We will start with more muscle energy in the thoracic spine noted today in case there was a potential fracture noted.  Patient knows that if any shortness of breath, chest pain to seek medical attention immediately.  Patient will follow up with me again 6 to 8 weeks.

## 2020-10-04 NOTE — Progress Notes (Signed)
[place] [address] [place] Phone: [phone] Subjective:   I Veronica [name] am serving as a Education administrator for Dr. Hulan Saas.  This visit occurred during the SARS-CoV-2 public health emergency.  Safety protocols were in place, including screening questions prior to the visit, additional usage of staff PPE, and extensive cleaning of exam room while observing appropriate contact time as indicated for disinfecting solutions.   I'm seeing this patient by the request  of:  [name], NP  CC: Wrist pain follow-up, neck pain follow-up  [id number]  Veronica [name] is a 65 y.o. female coming in with complaint of back and neck pain. OMT 09/08/2020. Patient states she is doing alright. States she may have a rib out. Was wedged between a horse and a wall recently.  Patient did notice this about 6 weeks ago.  Has had rib pain since.  Describes the pain as a dull, throbbing aching pain with deep inspiration.  It only hurts minorly though it continues to improve  Medications patient has been prescribed: Gabapentin, meloxicam  Taking: Yes  Patient on last exam was given a carpal tunnel injection on the left side September 08, 2020.       Reviewed prior external information including notes and imaging from previsou exam, outside providers and external EMR if available.   As well as notes that were available from care everywhere and other healthcare systems.  Past medical history, social, surgical and family history all reviewed in electronic medical record.  No pertanent information unless stated regarding to the chief complaint.   Past Medical History:  Diagnosis Date   Depression    Dry eyes     No Known Allergies   Review of Systems:  No headache, visual changes, nausea, vomiting, diarrhea, constipation, dizziness, abdominal pain, skin rash, fevers, chills, night sweats, weight loss, swollen lymph nodes, body aches, joint swelling, chest  pain, shortness of breath, mood changes. POSITIVE muscle aches  Objective  Blood pressure 102/80, pulse 68, height 5\' 2"  (1.575 m), weight 133 lb (60.3 kg), SpO2 98 %.   General: No apparent distress alert and oriented x3 mood and affect normal, dressed appropriately.  HEENT: Pupils equal, extraocular movements intact  Respiratory: Patient's speak in full sentences and does not appear short of breath  Cardiovascular: No lower extremity edema, non tender, no erythema  Tightness noted on the left side of the chest wall.  No masses feeling.  Patient does have tenderness over the sixth rib anteriorly and minorly laterally on the left side only.  No crepitus noted.  Patient does have some discomfort with deep breathing. Significant limitation in range of motion of the neck noted with significant crepitus. Negative Tinel sign on both wrists today.  Osteopathic findings  C2 flexed rotated and side bent right C6 flexed rotated and side bent left T6 extended rotated and side bent left with inhaled rib L2 flexed rotated and side bent right Sacrum right on right       Assessment and Plan:  Degenerative disc disease, cervical Patient does have degenerative disc disease of the cervical spine.  Discussed icing regimen and home exercises.  Discussed which I can also do which wants to avoid.  Patient has done relatively well.  No significant Chest wall.  Discussed the potential for x-rays which patient declined.  We will start with more muscle energy in the thoracic spine noted today in case there was a potential fracture noted.  Patient knows  that if any shortness of breath, chest pain to seek medical attention immediately.  Patient will follow up with me again 6 to 8 weeks.   Nonallopathic problems  Decision today to treat with OMT was based on Physical Exam  After verbal consent patient was treated with HVLA, ME, FPR techniques in cervical, rib, thoracic, lumbar, and sacral  areas  Patient  tolerated the procedure well with improvement in symptoms  Patient given exercises, stretches and lifestyle modifications  See medications in patient instructions if given  Patient will follow up in 4-8 weeks      The above documentation has been reviewed and is accurate and complete [name], DO       Note: This dictation was prepared with Dragon dictation along with smaller phrase technology. Any transcriptional errors that result from this process are unintentional.

## 2020-10-04 NOTE — Patient Instructions (Addendum)
Good to see you Keep your head on the swivel for the horses See me again in 6-8 weeks

## 2020-10-09 ENCOUNTER — Other Ambulatory Visit: Payer: Self-pay | Admitting: Primary Care

## 2020-10-09 DIAGNOSIS — F32A Depression, unspecified: Secondary | ICD-10-CM

## 2020-10-09 DIAGNOSIS — F419 Anxiety disorder, unspecified: Secondary | ICD-10-CM

## 2020-10-13 NOTE — Telephone Encounter (Signed)
Left message to return call to our office.  

## 2020-10-18 NOTE — Telephone Encounter (Signed)
Called x 3 l/m to call office. Have sent my chart to call as well.

## 2020-10-28 ENCOUNTER — Ambulatory Visit: Payer: 59 | Admitting: Primary Care

## 2020-10-28 ENCOUNTER — Encounter: Payer: Self-pay | Admitting: Primary Care

## 2020-10-28 ENCOUNTER — Other Ambulatory Visit: Payer: Self-pay

## 2020-10-28 VITALS — BP 120/68 | HR 60 | Temp 98.4°F | Ht 62.5 in | Wt 135.0 lb

## 2020-10-28 DIAGNOSIS — M5030 Other cervical disc degeneration, unspecified cervical region: Secondary | ICD-10-CM | POA: Diagnosis not present

## 2020-10-28 DIAGNOSIS — G8929 Other chronic pain: Secondary | ICD-10-CM

## 2020-10-28 DIAGNOSIS — Z0000 Encounter for general adult medical examination without abnormal findings: Secondary | ICD-10-CM | POA: Diagnosis not present

## 2020-10-28 DIAGNOSIS — M542 Cervicalgia: Secondary | ICD-10-CM

## 2020-10-28 DIAGNOSIS — F419 Anxiety disorder, unspecified: Secondary | ICD-10-CM

## 2020-10-28 DIAGNOSIS — F32A Depression, unspecified: Secondary | ICD-10-CM

## 2020-10-28 DIAGNOSIS — G43701 Chronic migraine without aura, not intractable, with status migrainosus: Secondary | ICD-10-CM | POA: Diagnosis not present

## 2020-10-28 DIAGNOSIS — M549 Dorsalgia, unspecified: Secondary | ICD-10-CM

## 2020-10-28 LAB — LIPID PANEL
Cholesterol: 211 mg/dL — ABNORMAL HIGH (ref 0–200)
HDL: 84.5 mg/dL (ref >39.00)
LDL Cholesterol: 109 mg/dL — ABNORMAL HIGH (ref 0–99)
NonHDL: 126.88
Total CHOL/HDL Ratio: 3
Triglycerides: 91 mg/dL (ref 0.0–149.0)
VLDL: 18.2 mg/dL (ref 0.0–40.0)

## 2020-10-28 LAB — COMPREHENSIVE METABOLIC PANEL
ALT: 15 U/L (ref 0–35)
AST: 24 U/L (ref 0–37)
Albumin: 4.1 g/dL (ref 3.5–5.2)
Alkaline Phosphatase: 47 U/L (ref 39–117)
BUN: 14 mg/dL (ref 6–23)
CO2: 32 mEq/L (ref 19–32)
Calcium: 9.5 mg/dL (ref 8.4–10.5)
Chloride: 104 mEq/L (ref 96–112)
Creatinine, Ser: 0.78 mg/dL (ref 0.40–1.20)
GFR: 81.03 mL/min (ref >60.00)
Glucose, Bld: 72 mg/dL (ref 70–99)
Potassium: 4.6 mEq/L (ref 3.5–5.1)
Sodium: 140 mEq/L (ref 135–145)
Total Bilirubin: 0.8 mg/dL (ref 0.2–1.2)
Total Protein: 6.7 g/dL (ref 6.0–8.3)

## 2020-10-28 MED ORDER — BUPROPION HCL ER (XL) 150 MG PO TB24: 150.0000 mg | ORAL_TABLET | Freq: Every day | ORAL | 3 refills | Status: DC

## 2020-10-28 NOTE — Assessment & Plan Note (Signed)
Doing well on Wellbutrin XL 150 mg, continue same.  Refills sent to pharmacy.

## 2020-10-28 NOTE — Patient Instructions (Signed)
Stop by the lab prior to leaving today. I will notify you of your results once received.   It was a pleasure to see you today!  Preventive Care 40-64 Years Old, Female Preventive care refers to lifestyle choices and visits with your health care provider that can promote health and wellness. This includes: A yearly physical exam. This is also called an annual wellness visit. Regular dental and eye exams. Immunizations. Screening for certain conditions. Healthy lifestyle choices, such as: Eating a healthy diet. Getting regular exercise. Not using drugs or products that contain nicotine and tobacco. Limiting alcohol use. What can I expect for my preventive care visit? Physical exam Your health care provider will check your: Height and weight. These may be used to calculate your BMI (body mass index). BMI is a measurement that tells if you are at a healthy weight. Heart rate and blood pressure. Body temperature. Skin for abnormal spots. Counseling Your health care provider may ask you questions about your: Past medical problems. Family's medical history. Alcohol, tobacco, and drug use. Emotional well-being. Home life and relationship well-being. Sexual activity. Diet, exercise, and sleep habits. Work and work environment. Access to firearms. Method of birth control. Menstrual cycle. Pregnancy history. What immunizations do I need?  Vaccines are usually given at various ages, according to a schedule. Your health care provider will recommend vaccines for you based on your age, medicalhistory, and lifestyle or other factors, such as travel or where you work. What tests do I need? Blood tests Lipid and cholesterol levels. These may be checked every 5 years, or more often if you are over 50 years old. Hepatitis C test. Hepatitis B test. Screening Lung cancer screening. You may have this screening every year starting at age 55 if you have a 30-pack-year history of smoking and  currently smoke or have quit within the past 15 years. Colorectal cancer screening. All adults should have this screening starting at age 50 and continuing until age 75. Your health care provider may recommend screening at age 45 if you are at increased risk. You will have tests every 1-10 years, depending on your results and the type of screening test. Diabetes screening. This is done by checking your blood sugar (glucose) after you have not eaten for a while (fasting). You may have this done every 1-3 years. Mammogram. This may be done every 1-2 years. Talk with your health care provider about when you should start having regular mammograms. This may depend on whether you have a family history of breast cancer. BRCA-related cancer screening. This may be done if you have a family history of breast, ovarian, tubal, or peritoneal cancers. Pelvic exam and Pap test. This may be done every 3 years starting at age 21. Starting at age 30, this may be done every 5 years if you have a Pap test in combination with an HPV test. Other tests STD (sexually transmitted disease) testing, if you are at risk. Bone density scan. This is done to screen for osteoporosis. You may have this scan if you are at high risk for osteoporosis. Talk with your health care provider about your test results, treatment options,and if necessary, the need for more tests. Follow these instructions at home: Eating and drinking  Eat a diet that includes fresh fruits and vegetables, whole grains, lean protein, and low-fat dairy products. Take vitamin and mineral supplements as recommended by your health care provider. Do not drink alcohol if: Your health care provider tells you not to drink.   You are pregnant, may be pregnant, or are planning to become pregnant. If you drink alcohol: Limit how much you have to 0-1 drink a day. Be aware of how much alcohol is in your drink. In the [place] one drink equals one 12 oz bottle of beer  (355 mL), one 5 oz glass of wine (148 mL), or one 1 oz glass of hard liquor (44 mL).  Lifestyle Take daily care of your teeth and gums. Brush your teeth every morning and night with fluoride toothpaste. Floss one time each day. Stay active. Exercise for at least 30 minutes 5 or more days each week. Do not use any products that contain nicotine or tobacco, such as cigarettes, e-cigarettes, and chewing tobacco. If you need help quitting, ask your health care provider. Do not use drugs. If you are sexually active, practice safe sex. Use a condom or other form of protection to prevent STIs (sexually transmitted infections). If you do not wish to become pregnant, use a form of birth control. If you plan to become pregnant, see your health care provider for a prepregnancy visit. If told by your health care provider, take low-dose aspirin daily starting at age 50. Find healthy ways to cope with stress, such as: Meditation, yoga, or listening to music. Journaling. Talking to a trusted person. Spending time with friends and family. Safety Always wear your seat belt while driving or riding in a vehicle. Do not drive: If you have been drinking alcohol. Do not ride with someone who has been drinking. When you are tired or distracted. While texting. Wear a helmet and other protective equipment during sports activities. If you have firearms in your house, make sure you follow all gun safety procedures. What's next? Visit your health care provider once a year for an annual wellness visit. Ask your health care provider how often you should have your eyes and teeth checked. Stay up to date on all vaccines. This information is not intended to replace advice given to you by your health care provider. Make sure you discuss any questions you have with your healthcare provider. Document Revised: 12/22/2019 Document Reviewed: 11/28/2017 Elsevier Patient Education  2022 [employer]  

## 2020-10-28 NOTE — Progress Notes (Signed)
Subjective:    Patient ID: Veronica [name], female    DOB: 08-14-1955, 65 y.o.   MRN: [id number]  HPI  Veronica [name] is a very pleasant 65 y.o. female who presents today for complete physical and follow up of chronic conditions.  Immunizations: -Tetanus: 2019 -Influenza: Due this season  -Covid-19: 2 vaccines -Shingles: Never completed, declines   Diet: Fair diet.  Exercise: Regular exercise.  Eye exam: Completes annually  Dental exam: Completes semi-annually   Pap Smear: Completed in 2021 Mammogram: Completed in January 2022 Colonoscopy: Completed in 2014, due 2024  BP Readings from Last 3 Encounters:  10/28/20 120/68  10/04/20 102/80  09/08/20 112/68       Review of Systems  Constitutional:  Negative for unexpected weight change.  HENT:  Negative for rhinorrhea.   Eyes:  Negative for visual disturbance.  Respiratory:  Negative for shortness of breath.   Cardiovascular:  Negative for chest pain.  Gastrointestinal:  Negative for constipation and diarrhea.  Genitourinary:  Negative for difficulty urinating.  Musculoskeletal:  Positive for arthralgias and neck pain.  Skin:  Negative for rash.  Allergic/Immunologic: Negative for environmental allergies.  Neurological:  Negative for dizziness and headaches.  Psychiatric/Behavioral:  The patient is not nervous/anxious.         Past Medical History:  Diagnosis Date   Depression    Dry eyes     Social History   Socioeconomic History   Marital status: Married    Spouse name: Not on file   Number of children: Not on file   Years of education: Not on file   Highest education level: Not on file  Occupational History   Not on file  Tobacco Use   Smoking status: Former    Types: Cigarettes    Quit date: 06/11/1988    Years since quitting: 32.4   Smokeless tobacco: Never  Substance and Sexual Activity   Alcohol use: Yes    Alcohol/week: 6.0 standard drinks    Types: 6 Glasses of wine per week   Drug use: No    Sexual activity: Not on file  Other Topics Concern   Not on file  Social History Narrative   Married.   No children.   Works for [employer].   Enjoys showing and riding horses.    Social Determinants of Health   Financial Resource Strain: Not on file  Food Insecurity: Not on file  Transportation Needs: Not on file  Physical Activity: Not on file  Stress: Not on file  Social Connections: Not on file  Intimate Partner Violence: Not on file    Past Surgical History:  Procedure Laterality Date   CLOSED REDUCTION HAND FRACTURE  2002   left   LASIK      Family History  Problem Relation Age of Onset   Hypertension Mother    Heart disease Mother    COPD Father    Colon cancer Neg Hx     No Known Allergies  Current Outpatient Medications on File Prior to Visit  Medication Sig Dispense Refill   buPROPion (WELLBUTRIN XL) 150 MG 24 hr tablet Take 1 tablet (150 mg total) by mouth daily. For anxiety and depression. Must be seen for further refills! 30 tablet 0   Diclofenac Sodium (PENNSAID) 2 % SOLN Place 1 application onto the skin 2 (two) times daily. 1 Bottle 3   gabapentin (NEURONTIN) 100 MG capsule TAKE 2 CAPSULES AT BEDTIME 60 capsule 3   gabapentin (NEURONTIN) 100  MG capsule Take 2 capsules (200 mg total) by mouth at bedtime. 14 capsule 0   meloxicam (MOBIC) 15 MG tablet TAKE 1 TABLET BY MOUTH EVERY DAY AS NEEDED 30 tablet 1   SUMAtriptan-naproxen (TREXIMET) 85-500 MG tablet Take 1 tablet by mouth at migraine onset. May repeat in 2 hours if migraine persists, do not exceed 2 tablets in 24 hours. 10 tablet 0   triamcinolone cream (KENALOG) 0.1 % Apply topically as needed.     Ibuprofen-Famotidine 800-26.6 MG TABS Take 1 tablet by mouth 3 (three) times daily. (Patient not taking: Reported on 10/28/2020) 90 tablet 3   predniSONE (DELTASONE) 20 MG tablet Take 2 tablets by mouth daily with breakfast ('40mg'$  total) (Patient not taking: Reported on 10/28/2020) 10 tablet 0    No current facility-administered medications on file prior to visit.    BP 120/68 (BP Location: Left Arm, Patient Position: Sitting, Cuff Size: Normal)   Pulse 60   Temp 98.4 F (36.9 C) (Temporal)   Ht 5' 2.5" (1.588 m)   Wt 135 lb (61.2 kg)   SpO2 98%   BMI 24.30 kg/m  Objective:   Physical Exam HENT:     Right Ear: Tympanic membrane and ear canal normal.     Left Ear: Tympanic membrane and ear canal normal.     Nose: Nose normal.  Eyes:     Conjunctiva/sclera: Conjunctivae normal.     Pupils: Pupils are equal, round, and reactive to light.  Neck:     Thyroid: No thyromegaly.  Cardiovascular:     Rate and Rhythm: Normal rate and regular rhythm.     Heart sounds: No murmur heard. Pulmonary:     Effort: Pulmonary effort is normal.     Breath sounds: Normal breath sounds. No rales.  Abdominal:     General: Bowel sounds are normal.     Palpations: Abdomen is soft.     Tenderness: There is no abdominal tenderness.  Musculoskeletal:        General: Normal range of motion.     Cervical back: Neck supple.  Lymphadenopathy:     Cervical: No cervical adenopathy.  Skin:    General: Skin is warm and dry.     Findings: No rash.  Neurological:     Mental Status: She is alert and oriented to person, place, and time.     Cranial Nerves: No cranial nerve deficit.     Deep Tendon Reflexes: Reflexes are normal and symmetric.  Psychiatric:        Mood and Affect: Mood normal.          Assessment & Plan:      This visit occurred during the SARS-CoV-2 public health emergency.  Safety protocols were in place, including screening questions prior to the visit, additional usage of staff PPE, and extensive cleaning of exam room while observing appropriate contact time as indicated for disinfecting solutions.

## 2020-10-28 NOTE — Assessment & Plan Note (Signed)
Declines Shingrix, other vaccines UTD. Colonoscopy UTD, due in 2024. Mammogram UTD. Pap smear UTD.  Exam today stable. Labs pending and reviewed from sports medicine.

## 2020-10-28 NOTE — Assessment & Plan Note (Signed)
Stable, no concerns today. Continue to monitor.

## 2020-10-28 NOTE — Assessment & Plan Note (Signed)
Following with sports medicine, continue gabapentin 200 mg HS and PRN Meloxicam 15 mg.

## 2020-10-28 NOTE — Assessment & Plan Note (Signed)
Following with sports medicine.  Continue gabapentin 200 mg HS. Continue Meloxicam 15 mg PRN.

## 2020-11-08 ENCOUNTER — Other Ambulatory Visit: Payer: Self-pay

## 2020-11-08 ENCOUNTER — Ambulatory Visit: Payer: 59 | Admitting: Family Medicine

## 2020-11-08 VITALS — BP 100/70 | HR 61 | Ht 62.5 in | Wt 134.0 lb

## 2020-11-08 DIAGNOSIS — M9903 Segmental and somatic dysfunction of lumbar region: Secondary | ICD-10-CM | POA: Diagnosis not present

## 2020-11-08 DIAGNOSIS — M9902 Segmental and somatic dysfunction of thoracic region: Secondary | ICD-10-CM

## 2020-11-08 DIAGNOSIS — M9901 Segmental and somatic dysfunction of cervical region: Secondary | ICD-10-CM

## 2020-11-08 DIAGNOSIS — M9904 Segmental and somatic dysfunction of sacral region: Secondary | ICD-10-CM

## 2020-11-08 DIAGNOSIS — G5602 Carpal tunnel syndrome, left upper limb: Secondary | ICD-10-CM

## 2020-11-08 DIAGNOSIS — M5030 Other cervical disc degeneration, unspecified cervical region: Secondary | ICD-10-CM

## 2020-11-08 DIAGNOSIS — M9908 Segmental and somatic dysfunction of rib cage: Secondary | ICD-10-CM | POA: Diagnosis not present

## 2020-11-08 MED ORDER — GABAPENTIN 100 MG PO CAPS: 200.0000 mg | ORAL_CAPSULE | Freq: Every day | ORAL | 0 refills | Status: DC

## 2020-11-08 NOTE — Assessment & Plan Note (Signed)
Degenerative disc disease.  Patient is responding well to manipulation.  Patient has not wanted to get another epidural yet at this time.  He does have meloxicam for breakthrough.  Does have the gabapentin.  We discussed the over-the-counter medications.  Can always increase gabapentin if needed.  Follow-up with me again in 5 weeks

## 2020-11-08 NOTE — Progress Notes (Signed)
[facility] Phone: [phone] Subjective:    I'm seeing this patient by the request  of:  [name], NP  CC: Neck and back pain follow-up  [id number]  Veronica [name] is a 65 y.o. female coming in with complaint of back and neck pain Patient states continues to have some mild discomfort.  Worsening numbness noted as well.  Patient describes it as a dull, throbbing aching.  He thinks the last fall might have caused the morning discomfort.  Feels like some of the carpal tunnel is coming back as well.  Medications patient has been prescribed:   Taking:         Reviewed prior external information including notes and imaging from previsou exam, outside providers and external EMR if available.   As well as notes that were available from care everywhere and other healthcare systems.  Past medical history, social, surgical and family history all reviewed in electronic medical record.  No pertanent information unless stated regarding to the chief complaint.   Past Medical History:  Diagnosis Date   Depression    Dry eyes     No Known Allergies   Review of Systems:  No headache, visual changes, nausea, vomiting, diarrhea, constipation, dizziness, abdominal pain, skin rash, fevers, chills, night sweats, weight loss, swollen lymph nodes, body aches, joint swelling, chest pain, shortness of breath, mood changes. POSITIVE muscle aches  Objective  Blood pressure 100/70, pulse 61, height 5' 2.5" (1.588 m), weight 134 lb (60.8 kg), SpO2 98 %.   General: No apparent distress alert and oriented x3 mood and affect normal, dressed appropriately.  HEENT: Pupils equal, extraocular movements intact  Respiratory: Patient's speak in full sentences and does not appear short of breath  Cardiovascular: No lower extremity edema, non tender, no erythema  Neck exam shows the patient does have some mild loss of lordosis.   Patient does have significant decrease in sidebending of the neck bilaterally.  Patient has more tightness in the parascapular region of the neck as well.  Osteopathic findings  C5 flexed rotated and side bent right C6 flexed rotated and side bent left T3 extended rotated and side bent right inhaled rib T8 extended rotated and side bent left L2 flexed rotated and side bent right Sacrum right on right       Assessment and Plan: Degenerative disc disease, cervical Degenerative disc disease.  Patient is responding well to manipulation.  Patient has not wanted to get another epidural yet at this time.  He does have meloxicam for breakthrough.  Does have the gabapentin.  We discussed the over-the-counter medications.  Can always increase gabapentin if needed.  Follow-up with me again in 5 weeks    Nonallopathic problems  Decision today to treat with OMT was based on Physical Exam  After verbal consent patient was treated with HVLA, ME, FPR techniques in cervical, rib, thoracic, lumbar, and sacral  areas  Patient tolerated the procedure well with improvement in symptoms  Patient given exercises, stretches and lifestyle modifications  See medications in patient instructions if given  Patient will follow up in 5 weeks      The above documentation has been reviewed and is accurate and complete [name], DO       Note: This dictation was prepared with Dragon dictation along with smaller phrase technology. Any transcriptional errors that result from this process are unintentional.

## 2020-11-08 NOTE — Progress Notes (Signed)
  [name] [place] Phone: [phone] Subjective:   Veronica [name], am serving as a scribe for Dr. Hulan Saas.  I'm seeing this patient by the request  of:  [name], NP  CC:   [id number]  Veronica [name] is a 65 y.o. female coming in with complaint of back and neck pain. OMT 10/04/2020. Patient states that her neck is very stuck and making her hands numb.   Medications patient has been prescribed:   Taking:         Reviewed prior external information including notes and imaging from previsou exam, outside providers and external EMR if available.   As well as notes that were available from care everywhere and other healthcare systems.  Past medical history, social, surgical and family history all reviewed in electronic medical record.  No pertanent information unless stated regarding to the chief complaint.   Past Medical History:  Diagnosis Date   Depression    Dry eyes     No Known Allergies   Review of Systems:  No headache, visual changes, nausea, vomiting, diarrhea, constipation, dizziness, abdominal pain, skin rash, fevers, chills, night sweats, weight loss, swollen lymph nodes, body aches, joint swelling, chest pain, shortness of breath, mood changes. POSITIVE muscle aches  Objective  There were no vitals taken for this visit.   General: No apparent distress alert and oriented x3 mood and affect normal, dressed appropriately.  HEENT: Pupils equal, extraocular movements intact  Respiratory: Patient's speak in full sentences and does not appear short of breath  Cardiovascular: No lower extremity edema, non tender, no erythema  Neuro: Cranial nerves II through XII are intact, neurovascularly intact in all extremities with 2+ DTRs and 2+ pulses.  Gait normal with good balance and coordination.  MSK:  Non tender with full range of motion and good stability and symmetric strength and tone of  shoulders, elbows, wrist, hip, knee and ankles bilaterally.  Back - Normal skin, Spine with normal alignment and no deformity.  No tenderness to vertebral process palpation.  Paraspinous muscles are not tender and without spasm.   Range of motion is full at neck and lumbar sacral regions  Osteopathic findings  C2 flexed rotated and side bent right C6 flexed rotated and side bent left T3 extended rotated and side bent right inhaled rib T9 extended rotated and side bent left L2 flexed rotated and side bent right Sacrum right on right       Assessment and Plan:    Nonallopathic problems  Decision today to treat with OMT was based on Physical Exam  After verbal consent patient was treated with HVLA, ME, FPR techniques in cervical, rib, thoracic, lumbar, and sacral  areas  Patient tolerated the procedure well with improvement in symptoms  Patient given exercises, stretches and lifestyle modifications  See medications in patient instructions if given  Patient will follow up in 4-8 weeks      The above documentation has been reviewed and is accurate and complete Veronica [name]       Note: This dictation was prepared with Dragon dictation along with smaller phrase technology. Any transcriptional errors that result from this process are unintentional.

## 2020-11-08 NOTE — Patient Instructions (Addendum)
See me again in 5 weeks Keep watching numbness Tart Cherry Extract '1200mg'$  at night

## 2020-11-08 NOTE — Assessment & Plan Note (Signed)
Initially did do well with the injections but unfortunately is having worsening pain again with the carpal tunnel.  Could be potentially cervical radiculopathy.  We discussed the possibility of nerve conduction test patient does not feel that this would change medical management because she would not want any surgical intervention.  We can repeat injections if needed but at the moment we will hold.  Follow-up again in 5 to 6 weeks

## 2020-11-23 ENCOUNTER — Encounter: Payer: Self-pay | Admitting: Family Medicine

## 2020-11-23 ENCOUNTER — Other Ambulatory Visit: Payer: Self-pay

## 2020-11-23 DIAGNOSIS — G5603 Carpal tunnel syndrome, bilateral upper limbs: Secondary | ICD-10-CM

## 2020-11-24 ENCOUNTER — Encounter: Payer: Self-pay | Admitting: Neurology

## 2020-11-24 ENCOUNTER — Other Ambulatory Visit: Payer: Self-pay

## 2020-11-24 DIAGNOSIS — R202 Paresthesia of skin: Secondary | ICD-10-CM

## 2020-12-07 ENCOUNTER — Other Ambulatory Visit: Payer: Self-pay

## 2020-12-07 ENCOUNTER — Ambulatory Visit: Payer: 59 | Admitting: Neurology

## 2020-12-07 DIAGNOSIS — G5603 Carpal tunnel syndrome, bilateral upper limbs: Secondary | ICD-10-CM

## 2020-12-07 DIAGNOSIS — R202 Paresthesia of skin: Secondary | ICD-10-CM | POA: Diagnosis not present

## 2020-12-07 NOTE — Procedures (Signed)
[facility]  [place], [address]  [place], [place] [address] Tel: [phone] Fax:  [phone] Test Date:  12/07/2020  Patient: Veronica [name] DOB: [birth year] Physician: Narda Amber, DO  Sex: Female Height: '5\' 2"'$  Ref Phys: Hulan Saas, DO  ID#: [id number]   Technician:    Patient Complaints: This is a 65 year old female referred for evaluation of bilateral hand paresthesias and pain.  NCV & EMG Findings: Extensive electrodiagnostic testing of the right upper extremity and additional studies of the left shows:  Bilateral median sensory responses show prolonged latency (R5.4, L4.8 ms).  Bilateral ulnar sensory responses are within normal limits. Bilateral median motor responses show prolonged latency (R7.7, L4.5 ms) and amplitude is asymmetrically reduced on the right (5.7 mV).  Bilateral ulnar motor responses are within normal limits. Chronic motor axonal loss changes are seen affecting bilateral abductor pollicis brevis muscles, without accompanying active denervation.  Impression: Bilateral median neuropathy at or distal to the wrist, consistent with a clinical diagnosis of carpal tunnel syndrome.  Overall, these findings are moderate in degree electrically and worse on the right.   ___________________________ Narda Amber, DO    Nerve Conduction Studies Anti Sensory Summary Table   Stim Site NR Peak (ms) Norm Peak (ms) P-T Amp (V) Norm P-T Amp  Left Median Anti Sensory (2nd Digit)  32C  Wrist    4.8 <3.8 17.6 >10  Right Median Anti Sensory (2nd Digit)  32C  Wrist    5.4 <3.8 14.1 >10  Left Ulnar Anti Sensory (5th Digit)  32C  Wrist    2.4 <3.2 38.7 >5  Right Ulnar Anti Sensory (5th Digit)  32C  Wrist    2.3 <3.2 34.4 >5   Motor Summary Table   Stim Site NR Onset (ms) Norm Onset (ms) O-P Amp (mV) Norm O-P Amp Site1 Site2 Delta-0 (ms) Dist (cm) Vel (m/s) Norm Vel (m/s)  Left Median Motor (Abd Poll Brev)  32C  Wrist    4.5 <4.0 9.8 >5 Elbow Wrist  4.6 27.0 59 >50  Elbow    9.1  9.2         Right Median Motor (Abd Poll Brev)  32C  Wrist    7.7 <4.0 5.7 >5 Elbow Wrist 4.3 26.0 60 >50  Elbow    12.0  5.1         Left Ulnar Motor (Abd Dig Minimi)  32C  Wrist    2.0 <3.1 10.2 >7 B Elbow Wrist 3.0 20.0 67 >50  B Elbow    5.0  9.9  A Elbow B Elbow 1.5 10.0 67 >50  A Elbow    6.5  9.6         Right Ulnar Motor (Abd Dig Minimi)  32C  Wrist    2.0 <3.1 9.5 >7 B Elbow Wrist 2.9 20.0 69 >50  B Elbow    4.9  9.4  A Elbow B Elbow 1.4 10.0 71 >50  A Elbow    6.3  9.0          EMG   Side Muscle Ins Act Fibs Psw Fasc Number Recrt Dur Dur. Amp Amp. Poly Poly. Comment  Right 1stDorInt Nml Nml Nml Nml Nml Nml Nml Nml Nml Nml Nml Nml N/A  Right Abd Poll Brev Nml Nml Nml Nml 1- Rapid Few 1+ Few 1+ Few 1+ N/A  Right PronatorTeres Nml Nml Nml Nml Nml Nml Nml Nml Nml Nml Nml Nml N/A  Right Biceps Nml Nml Nml  Nml Nml Nml Nml Nml Nml Nml Nml Nml N/A  Right Triceps Nml Nml Nml Nml Nml Nml Nml Nml Nml Nml Nml Nml N/A  Right Deltoid Nml Nml Nml Nml Nml Nml Nml Nml Nml Nml Nml Nml N/A  Left 1stDorInt Nml Nml Nml Nml Nml Nml Nml Nml Nml Nml Nml Nml N/A  Left Abd Poll Brev Nml Nml Nml Nml 1- Rapid Few 1+ Few 1+ Few 1+ N/A  Left PronatorTeres Nml Nml Nml Nml Nml Nml Nml Nml Nml Nml Nml Nml N/A      Waveforms:

## 2020-12-12 NOTE — Progress Notes (Signed)
[place] Phone: [phone] Subjective:   Veronica [name], am serving as a scribe for Dr. Hulan Saas.  This visit occurred during the SARS-CoV-2 public health emergency.  Safety protocols were in place, including screening questions prior to the visit, additional usage of staff PPE, and extensive cleaning of exam room while observing appropriate contact time as indicated for disinfecting solutions.   I'm seeing this patient by the request  of:  [name], NP  CC: Neck and back pain follow-up, wrist pain follow-up  [id number]  Veronica [name] is a 65 y.o. female coming in with complaint of back and neck pain. OMT on 11/08/2020. Patient states that she is having increase in numbness in both hands, R>L. Has not bee sleeping. Sensitive to touch in R DIP. Feels like a splinter is in skin.  Patient also has carpal tunnel.  Nerve conduction study did show the patient did have moderate to severe carpal tunnel bilaterally.  Patient would like to get a potential for a surgical intervention but does not want to have it done until November.  Medications patient has been prescribed: Gabapentin  Taking:         Reviewed prior external information including notes and imaging from previsou exam, outside providers and external EMR if available.   As well as notes that were available from care everywhere and other healthcare systems.  Past medical history, social, surgical and family history all reviewed in electronic medical record.  [name]   Past Medical History:  Diagnosis Date   Depression    Dry eyes     [name]   Review of Systems:  [name], visual changes, nausea, vomiting, diarrhea, constipation, dizziness, abdominal pain, skin rash, fevers, chills, night sweats, weight loss, swollen lymph nodes, body aches, joint swelling,  chest pain, shortness of breath, mood changes. POSITIVE muscle aches  Objective  Blood pressure 128/80, pulse 70, height 5' 2.5" (1.588 m), weight 133 lb (60.3 kg), SpO2 99 %.   General: [name], dressed appropriately.  HEENT: Pupils equal, extraocular movements intact  Respiratory: Patient's speak in full sentences and does not appear short of breath  Cardiovascular: [name], non tender, [name]  Right wrist exam shows a positive Tinel's.  Mild weakness noted with grip strength today.  This is mildly worse than previous exam.  Patient's left wrist also has a positive Tinel's sign but [name] significant weakness noted.  Neck exam does have some loss of lordosis and some tenderness to palpation.  Osteopathic findings  C2 flexed rotated and side bent right C6 flexed rotated and side bent left T3 extended rotated and side bent right inhaled rib T9 extended rotated and side bent left L2 flexed rotated and side bent right Sacrum right on right   Procedure: Real-time Ultrasound Guided Injection of right carpal tunnel Device: GE Logiq Q7 Ultrasound guided injection is preferred based studies that show increased duration, increased effect, greater accuracy, decreased procedural pain, increased response rate with ultrasound guided versus blind injection.  Verbal informed consent obtained.  Time-out conducted.  Noted [name] overlying erythema, induration, or other signs of local infection.  Skin prepped in a sterile fashion.  Local anesthesia: Topical Ethyl chloride.  With sterile technique and under real time ultrasound guidance:  median nerve visualized.  23g 5/8 inch needle inserted  distal to proximal approach into nerve sheath. Pictures taken nfor needle placement. Patient did have injection of 0.5 cc of 0.5% Marcaine, and 0.5 cc of Kenalog 40 mg/dL. Completed without difficulty  Pain immediately resolved suggesting  accurate placement of the medication.  Advised to call if fevers/chills, erythema, induration, drainage, or persistent bleeding.  Impression: Technically successful ultrasound guided injection.    Assessment and Plan:  Right carpal tunnel syndrome Nerve conduction study does show the patient has moderate to severe carpal tunnel bilaterally that greater than left.  Patient given an injection today secondary to the severity but would like to consider the possibility of surgical intervention in the near future and will refer accordingly.  We discussed icing regimen and home exercises.  Follow-up again in 6 to 8 weeks  Left carpal tunnel syndrome Having some discomfort and would like to consider the possibility of repeating injection on this side as well.  We will discuss at follow-up.  Chronic neck and back pain Known arthritic changes especially of the left.  We discussed the patient has responded well to osteopathic manipulation.  Nerve conduction study does show that the carpal tunnel is the severity of the problem.  Discussed icing regimen and home exercises.  Follow-up again in 6 to 8 weeks otherwise continue the medication which is the gabapentin  Degenerative disc disease, cervical Degenerative disc disease of the cervical spine.  Responded well to calcium by manipulation.  Nerve conduction study did not show any radicular symptoms that were too concerning.  Follow-up with me again in 6 to 8 weeks.   Nonallopathic problems  Decision today to treat with OMT was based on Physical Exam  After verbal consent patient was treated with HVLA, ME, FPR techniques in cervical, rib, thoracic, lumbar, and sacral  areas  Patient tolerated the procedure well with improvement in symptoms  Patient given exercises, stretches and lifestyle modifications  See medications in patient instructions if given  Patient will follow up in 4-8 weeks      The above documentation has been reviewed and is  accurate and complete Veronica Pulley, DO        Note: This dictation was prepared with Dragon dictation along with smaller phrase technology. Any transcriptional errors that result from this process are unintentional.

## 2020-12-13 ENCOUNTER — Ambulatory Visit: Payer: Self-pay

## 2020-12-13 ENCOUNTER — Ambulatory Visit: Payer: 59 | Admitting: Family Medicine

## 2020-12-13 ENCOUNTER — Other Ambulatory Visit: Payer: Self-pay

## 2020-12-13 VITALS — BP 128/80 | HR 70 | Ht 62.5 in | Wt 133.0 lb

## 2020-12-13 DIAGNOSIS — M25531 Pain in right wrist: Secondary | ICD-10-CM

## 2020-12-13 DIAGNOSIS — M542 Cervicalgia: Secondary | ICD-10-CM | POA: Diagnosis not present

## 2020-12-13 DIAGNOSIS — G5601 Carpal tunnel syndrome, right upper limb: Secondary | ICD-10-CM | POA: Diagnosis not present

## 2020-12-13 DIAGNOSIS — M9904 Segmental and somatic dysfunction of sacral region: Secondary | ICD-10-CM | POA: Diagnosis not present

## 2020-12-13 DIAGNOSIS — M9903 Segmental and somatic dysfunction of lumbar region: Secondary | ICD-10-CM | POA: Diagnosis not present

## 2020-12-13 DIAGNOSIS — M9901 Segmental and somatic dysfunction of cervical region: Secondary | ICD-10-CM

## 2020-12-13 DIAGNOSIS — M9908 Segmental and somatic dysfunction of rib cage: Secondary | ICD-10-CM

## 2020-12-13 DIAGNOSIS — M9902 Segmental and somatic dysfunction of thoracic region: Secondary | ICD-10-CM

## 2020-12-13 DIAGNOSIS — M5030 Other cervical disc degeneration, unspecified cervical region: Secondary | ICD-10-CM

## 2020-12-13 DIAGNOSIS — G5602 Carpal tunnel syndrome, left upper limb: Secondary | ICD-10-CM

## 2020-12-13 DIAGNOSIS — G8929 Other chronic pain: Secondary | ICD-10-CM

## 2020-12-13 DIAGNOSIS — M549 Dorsalgia, unspecified: Secondary | ICD-10-CM

## 2020-12-13 MED ORDER — DOXYCYCLINE HYCLATE 100 MG PO TABS: 100.0000 mg | ORAL_TABLET | Freq: Two times a day (BID) | ORAL | 0 refills | Status: DC

## 2020-12-13 NOTE — Patient Instructions (Addendum)
Injected right wrist today L wrist injection schedule for October 7th Referral to Dr. Amedeo Plenty See me again in 5-6 weeks

## 2020-12-13 NOTE — Assessment & Plan Note (Signed)
Known arthritic changes especially of the left.  We discussed the patient has responded well to osteopathic manipulation.  Nerve conduction study does show that the carpal tunnel is the severity of the problem.  Discussed icing regimen and home exercises.  Follow-up again in 6 to 8 weeks otherwise continue the medication which is the gabapentin

## 2020-12-13 NOTE — Assessment & Plan Note (Signed)
Having some discomfort and would like to consider the possibility of repeating injection on this side as well.  We will discuss at follow-up.

## 2020-12-13 NOTE — Assessment & Plan Note (Signed)
Degenerative disc disease of the cervical spine.  Responded well to calcium by manipulation.  Nerve conduction study did not show any radicular symptoms that were too concerning.  Follow-up with me again in 6 to 8 weeks.

## 2020-12-13 NOTE — Assessment & Plan Note (Signed)
Nerve conduction study does show the patient has moderate to severe carpal tunnel bilaterally that greater than left.  Patient given an injection today secondary to the severity but would like to consider the possibility of surgical intervention in the near future and will refer accordingly.  We discussed icing regimen and home exercises.  Follow-up again in 6 to 8 weeks

## 2020-12-27 ENCOUNTER — Encounter: Payer: 59 | Admitting: Neurology

## 2021-01-06 NOTE — Progress Notes (Signed)
Veronica [name] [address] [place] Phone: [phone] Subjective:   Veronica [name], am serving as a scribe for Dr. Hulan Saas. This visit occurred during the SARS-CoV-2 public health emergency.  Safety protocols were in place, including screening questions prior to the visit, additional usage of staff PPE, and extensive cleaning of exam room while observing appropriate contact time as indicated for disinfecting solutions.   I'm seeing this patient by the request  of:  [name], NP  CC: Left wrist pain, neck pain and back pain  [id number]  12/13/2020 Having some discomfort and would like to consider the possibility of repeating injection on this side as well.  We will discuss at follow-up. Nerve conduction study does show the patient has moderate to severe carpal tunnel bilaterally that greater than left.  Patient given an injection today secondary to the severity but would like to consider the possibility of surgical intervention in the near future and will refer accordingly.  We discussed icing regimen and home exercises.  Follow-up again in 6 to 8 weeks   Updated 01/09/2021 Veronica [name] is a 65 y.o. [name] coming in with complaint of left wrist pain. Left wrist injection wanted. Also neck and back are flaring up, regarding her neck in back has responded extremely well to manipulation previously.  Describes the pain as a dull, throbbing aching sensation but nothing that stops her from complete activity.     Past Medical History:  Diagnosis Date   Depression    Dry eyes    Past Surgical History:  Procedure Laterality Date   CLOSED REDUCTION HAND FRACTURE  2002   left   LASIK     Social History   Socioeconomic History   Marital status: Married    Spouse name: Not on file   Number of children: Not on file   Years of education: Not on file   Highest education level: Not on file  Occupational History   Not on file   Tobacco Use   Smoking status: Former    Types: Cigarettes    Quit date: 06/11/1988    Years since quitting: 32.6   Smokeless tobacco: Never  Substance and Sexual Activity   Alcohol use: Yes    Alcohol/week: 6.0 standard drinks    Types: 6 Glasses of wine per week   Drug use: No   Sexual activity: Not on file  Other Topics Concern   Not on file  Social History Narrative   Married.   No children.   Works for [employer].   Enjoys showing and riding horses.    Social Determinants of Health   Financial Resource Strain: Not on file  Food Insecurity: Not on file  Transportation Needs: Not on file  Physical Activity: Not on file  Stress: Not on file  Social Connections: Not on file   No Known Allergies Family History  Problem Relation Age of Onset   Hypertension Mother    Heart disease Mother    COPD Father    Colon cancer Neg Hx        Current Outpatient Medications (Analgesics):    meloxicam (MOBIC) 15 MG tablet, TAKE 1 TABLET BY MOUTH EVERY DAY AS NEEDED   SUMAtriptan-naproxen (TREXIMET) 85-500 MG tablet, Take 1 tablet by mouth at migraine onset. May repeat in 2 hours if migraine persists, do not exceed 2 tablets in 24 hours.   Current Outpatient Medications (Other):    buPROPion (  WELLBUTRIN XL) 150 MG 24 hr tablet, Take 1 tablet (150 mg total) by mouth daily. For anxiety and depression.   Diclofenac Sodium (PENNSAID) 2 % SOLN, Place 1 application onto the skin 2 (two) times daily.   doxycycline (VIBRA-TABS) 100 MG tablet, Take 1 tablet (100 mg total) by mouth 2 (two) times daily.   gabapentin (NEURONTIN) 100 MG capsule, Take 2 capsules (200 mg total) by mouth at bedtime.   triamcinolone cream (KENALOG) 0.1 %, Apply topically as needed.   Reviewed prior external information including notes and imaging from  primary care provider As well as notes that were available from care everywhere and other healthcare systems.  Past medical history, social,  surgical and family history all reviewed in electronic medical record.  No pertanent information unless stated regarding to the chief complaint.   Review of Systems:  No headache, visual changes, nausea, vomiting, diarrhea, constipation, dizziness, abdominal pain, skin rash, fevers, chills, night sweats, weight loss, swollen lymph nodes, body aches, joint swelling, chest pain, shortness of breath, mood changes. POSITIVE muscle aches  Objective  Blood pressure 106/62, pulse 83, height 5\' 2"  (1.575 m), weight 130 lb (59 kg), SpO2 98 %.   General: No apparent distress alert and oriented x3 mood and affect normal, dressed appropriately.  HEENT: Pupils equal, extraocular movements intact  Respiratory: Patient's speak in full sentences and does not appear short of breath  Cardiovascular: No lower extremity edema, non tender, no erythema  Gait normal with good balance and coordination.  MSK: Left wrist exam shows the patient does have a positive Tinel's.  Patient still has decent strength of the hand noted.  No thenar eminence wasting noted.  Neck exam does have loss of lordosis.  Patient does have crepitus noted with range of motion testing.  Negative Spurling's.  Procedure: Real-time Ultrasound Guided Injection of left carpal tunnel Device: GE Logiq Q7 Ultrasound guided injection is preferred based studies that show increased duration, increased effect, greater accuracy, decreased procedural pain, increased response rate with ultrasound guided versus blind injection.  Verbal informed consent obtained.  Time-out conducted.  Noted no overlying erythema, induration, or other signs of local infection.  Skin prepped in a sterile fashion.  Local anesthesia: Topical Ethyl chloride.  With sterile technique and under real time ultrasound guidance:  median nerve visualized.  23g 5/8 inch needle inserted distal to proximal approach into nerve sheath. Pictures taken nfor needle placement. Patient did have  injection of 0.5 cc of 0.5% Marcaine, and 0.5 cc of Kenalog 40 mg/dL. Completed without difficulty  Pain immediately resolved suggesting accurate placement of the medication.  Advised to call if fevers/chills, erythema, induration, drainage, or persistent bleeding.  Impression: Technically successful ultrasound guided injection.  Osteopathic findings C4 flexed rotated and side bent left T3 extended rotated and side bent left inhaled third rib L1 flexed rotated and side bent right Sacrum right on right    Impression and Recommendations:     The above documentation has been reviewed and is accurate and complete [name], DO

## 2021-01-09 ENCOUNTER — Ambulatory Visit: Payer: 59 | Admitting: Family Medicine

## 2021-01-09 ENCOUNTER — Ambulatory Visit: Payer: Self-pay

## 2021-01-09 ENCOUNTER — Other Ambulatory Visit: Payer: Self-pay

## 2021-01-09 ENCOUNTER — Encounter: Payer: Self-pay | Admitting: Family Medicine

## 2021-01-09 VITALS — BP 106/62 | HR 83 | Ht 62.0 in | Wt 130.0 lb

## 2021-01-09 DIAGNOSIS — M9903 Segmental and somatic dysfunction of lumbar region: Secondary | ICD-10-CM | POA: Diagnosis not present

## 2021-01-09 DIAGNOSIS — G5602 Carpal tunnel syndrome, left upper limb: Secondary | ICD-10-CM | POA: Diagnosis not present

## 2021-01-09 DIAGNOSIS — M9908 Segmental and somatic dysfunction of rib cage: Secondary | ICD-10-CM | POA: Diagnosis not present

## 2021-01-09 DIAGNOSIS — M9901 Segmental and somatic dysfunction of cervical region: Secondary | ICD-10-CM

## 2021-01-09 DIAGNOSIS — M9904 Segmental and somatic dysfunction of sacral region: Secondary | ICD-10-CM | POA: Diagnosis not present

## 2021-01-09 DIAGNOSIS — M5030 Other cervical disc degeneration, unspecified cervical region: Secondary | ICD-10-CM | POA: Diagnosis not present

## 2021-01-09 DIAGNOSIS — M9902 Segmental and somatic dysfunction of thoracic region: Secondary | ICD-10-CM | POA: Diagnosis not present

## 2021-01-09 NOTE — Assessment & Plan Note (Signed)
Patient given injection and tolerated the procedure well.  Discussed icing regimen and home exercise  Discussed with patient about icing regimen.  Patient will be bracing.  We will be following up with a surgeon in the next month.

## 2021-01-09 NOTE — Patient Instructions (Signed)
Say hi to [facility] for me Injected the wrist today Continue to stay active See you again in 6-8 weeks

## 2021-01-09 NOTE — Assessment & Plan Note (Signed)
Chronic abdominal pain but patient did not have any significant findings noted on radiograph Study and pointed more to the bilateral carpal tunnel.  Responding well though to manipulation otherwise.  Follow-up again in 6 to 8 weeks

## 2021-01-13 ENCOUNTER — Other Ambulatory Visit: Payer: Self-pay | Admitting: Family Medicine

## 2021-02-07 ENCOUNTER — Encounter: Payer: Self-pay | Admitting: Family Medicine

## 2021-02-10 ENCOUNTER — Other Ambulatory Visit: Payer: Self-pay

## 2021-02-10 ENCOUNTER — Ambulatory Visit (INDEPENDENT_AMBULATORY_CARE_PROVIDER_SITE_OTHER): Payer: 59

## 2021-02-10 ENCOUNTER — Ambulatory Visit: Payer: 59

## 2021-02-10 DIAGNOSIS — Z23 Encounter for immunization: Secondary | ICD-10-CM

## 2021-02-13 NOTE — Progress Notes (Signed)
Veronica [name] D.[place] Phone: [phone]   Assessment and Plan:     1. Left carpal tunnel syndrome -Chronic, recurrent, subsequent visit - Recurrence numbness/tingling in the median nerve distribution after CSI at previous visit. - Decided to not repeat corticosteroid injection at today's visit and continue follow-up with Dr. Amedeo Plenty to discuss potential surgery  2. Somatic dysfunction of cervical region 3. Somatic dysfunction of thoracic region 4. Somatic dysfunction of lumbar region 5. Somatic dysfunction of pelvic region 6. Somatic dysfunction of rib region -Chronic with exacerbation, subsequent visit - Recurrence of typical musculoskeletal complaints, likely stemming from horseback riding.  Most prominent being neck and upper back - Patient has received significant relief with OMT in the past.  Elects for repeat OMT today.  Tolerated well per note below. - Decision today to treat with OMT was based on Physical Exam   After verbal consent patient was treated with HVLA (high velocity low amplitude), ME (muscle energy), FPR (flex positional release), ST (soft tissue), PC/PD (Pelvic Compression/ Pelvic Decompression) techniques in cervical, rib, thoracic, lumbar, and pelvic areas. Patient tolerated the procedure well with improvement in symptoms.  Patient educated on potential side effects of soreness and recommended to rest, hydrate, and use Tylenol as needed for pain control.   Pertinent previous records reviewed include none   Follow Up: 6 weeks for repeat OMT   Subjective:   I, Veronica [name], am serving as a scribe for Dr. Glennon Mac  Chief Complaint:  Left wrist pain, neck pain and back pain  HPI:   02/14/21 Patient is a 65 year old female presenting with neck and back pain as well as left wrist pain. Patient was last seen by Dr. Tamala Julian on 01/09/21 for this reason and had OMT and a left wrist injection.  Today patient states that her right shoulder is hurting today and everything else is maintenance OMT. Patient wrists are fine going to Dr. Amedeo Plenty tomorrow to talk about if surgery is the next step or not.   Relevant Historical Information: None pertinent  Additional pertinent review of systems negative.  Current Outpatient Medications  Medication Sig Dispense Refill   buPROPion (WELLBUTRIN XL) 150 MG 24 hr tablet Take 1 tablet (150 mg total) by mouth daily. For anxiety and depression. 90 tablet 3   Diclofenac Sodium (PENNSAID) 2 % SOLN Place 1 application onto the skin 2 (two) times daily. 1 Bottle 3   doxycycline (VIBRA-TABS) 100 MG tablet Take 1 tablet (100 mg total) by mouth 2 (two) times daily. 14 tablet 0   gabapentin (NEURONTIN) 100 MG capsule TAKE 2 CAPSULES AT BEDTIME 180 capsule 0   meloxicam (MOBIC) 15 MG tablet TAKE 1 TABLET BY MOUTH EVERY DAY AS NEEDED 30 tablet 1   SUMAtriptan-naproxen (TREXIMET) 85-500 MG tablet Take 1 tablet by mouth at migraine onset. May repeat in 2 hours if migraine persists, do not exceed 2 tablets in 24 hours. 10 tablet 0   triamcinolone cream (KENALOG) 0.1 % Apply topically as needed.     No current facility-administered medications for this visit.      Objective:     Vitals:   02/14/21 1426  BP: 120/80  Pulse: 66  SpO2: 98%  Weight: 128 lb (58.1 kg)  Height: 5\' 2"  (1.575 m)      Body mass index is 23.41 kg/m.    Physical Exam:     General: Well-appearing, cooperative, sitting comfortably in no acute  distress.   OMT Physical Exam:  ASIS Compression Test: Positive Right Cervical: TTP paraspinal, C3 RRSL, C5-7 RRSR Rib: Bilateral elevated first rib with out TTP Thoracic: TTP paraspinal, T3-5 RRSL Lumbar: TTP paraspinal, L1-3 RRSL Pelvis: Right anterior innominate  Electronically signed by:  Veronica [name] D.[facility] 3:18 PM 02/14/21

## 2021-02-14 ENCOUNTER — Other Ambulatory Visit: Payer: Self-pay

## 2021-02-14 ENCOUNTER — Ambulatory Visit: Payer: 59 | Admitting: Sports Medicine

## 2021-02-14 VITALS — BP 120/80 | HR 66 | Ht 62.0 in | Wt 128.0 lb

## 2021-02-14 DIAGNOSIS — M9902 Segmental and somatic dysfunction of thoracic region: Secondary | ICD-10-CM | POA: Diagnosis not present

## 2021-02-14 DIAGNOSIS — G5602 Carpal tunnel syndrome, left upper limb: Secondary | ICD-10-CM | POA: Diagnosis not present

## 2021-02-14 DIAGNOSIS — M9905 Segmental and somatic dysfunction of pelvic region: Secondary | ICD-10-CM

## 2021-02-14 DIAGNOSIS — M9903 Segmental and somatic dysfunction of lumbar region: Secondary | ICD-10-CM

## 2021-02-14 DIAGNOSIS — M9901 Segmental and somatic dysfunction of cervical region: Secondary | ICD-10-CM | POA: Diagnosis not present

## 2021-02-14 DIAGNOSIS — M9908 Segmental and somatic dysfunction of rib cage: Secondary | ICD-10-CM

## 2021-02-14 NOTE — Patient Instructions (Addendum)
Good to see you   Follow up in 6 weeks for repeat OMT 

## 2021-02-15 ENCOUNTER — Ambulatory Visit: Payer: 59

## 2021-03-16 ENCOUNTER — Other Ambulatory Visit: Payer: Self-pay | Admitting: Family Medicine

## 2021-03-16 ENCOUNTER — Other Ambulatory Visit: Payer: Self-pay | Admitting: Internal Medicine

## 2021-03-16 DIAGNOSIS — Z1231 Encounter for screening mammogram for malignant neoplasm of breast: Secondary | ICD-10-CM

## 2021-03-16 NOTE — Telephone Encounter (Signed)
Left message for patient to call back  

## 2021-03-17 ENCOUNTER — Encounter: Payer: Self-pay | Admitting: Family Medicine

## 2021-03-17 ENCOUNTER — Other Ambulatory Visit: Payer: Self-pay

## 2021-03-17 MED ORDER — GABAPENTIN 100 MG PO CAPS: 200.0000 mg | ORAL_CAPSULE | Freq: Every day | ORAL | 0 refills | Status: DC

## 2021-03-17 MED ORDER — GABAPENTIN 100 MG PO CAPS: ORAL_CAPSULE | ORAL | 0 refills | Status: DC

## 2021-04-04 NOTE — Progress Notes (Signed)
[place] Phone: [phone] Subjective:   [name], am serving as a scribe for Dr. Hulan Saas.This visit occurred during the SARS-CoV-2 public health emergency.  Safety protocols were in place, including screening questions prior to the visit, additional usage of staff PPE, and extensive cleaning of exam room while observing appropriate contact time as indicated for disinfecting solutions.  I'm seeing this patient by the request  of:  [name], NP  CC: Neck pain, wrist pain follow-up  [id number]  Elira Colasanti is a 66 y.o. female coming in with complaint of back and neck pain. OMT 01/09/2021. Also f/u for B wrist pain. EMG done on R side. Discomfort also in L. Patient states that her neck is tight today. Wrist pain is unchanged. Saw Dr. Amedeo Plenty and is going to start with L sided surgery.   Medications patient has been prescribed: Gabapentin  Taking:         Reviewed prior external information including notes and imaging from previsou exam, outside providers and external EMR if available.   As well as notes that were available from care everywhere and other healthcare systems.  Past medical history, social, surgical and family history all reviewed in electronic medical record.  No pertanent information unless stated regarding to the chief complaint.   Past Medical History:  Diagnosis Date   Depression    Dry eyes     No Known Allergies   Review of Systems:  No headache, visual changes, nausea, vomiting, diarrhea, constipation, dizziness, abdominal pain, skin rash, fevers, chills, night sweats, weight loss, swollen lymph nodes, body aches, joint swelling, chest pain, shortness of breath, mood changes. POSITIVE muscle aches  Objective  Blood pressure 118/88, pulse 60, height 5\' 2"  (1.575 m), weight 124 lb (56.2 kg), SpO2 98 %.   General: No apparent distress alert and oriented x3 mood  and affect normal, dressed appropriately.  Patient has lost weight since we have seen her recently. HEENT: Pupils equal, extraocular movements intact  Respiratory: Patient's speak in full sentences and does not appear short of breath  Cardiovascular: No lower extremity edema, non tender, no erythema  Neck exam does have significant loss of lordosis.  Patient is some crepitus with range of motion.  Patient has 4-5 strength of the hands bilaterally but symmetric.  Continues to have a positive Tinel's sign.  Osteopathic findings  C2 flexed rotated and side bent right C6 flexed rotated and side bent left T3 extended rotated and side bent right inhaled rib T9 extended rotated and side bent left \       Assessment and Plan:  Degenerative disc disease, cervical Patient does have more of a degenerative disc disease that is fairly severe.  Patient EMG showed that patient is wrist pain is more secondary to the carpal tunnel.  Will be having surgery at some point.  Discussed icing regimen and home exercises, discussed which activities to do which wants to avoid.  Increase activity slowly.  Follow-up again in 6 to 8 weeks.  Loss of weight Patient is having recurrent weight loss again.  Had a very similar presentation of medicine greater than a year ago.  We will get laboratory work-up to see if anything else is contributing.  Patient is on Wellbutrin but that would be the only medication likely to be doing something of this nature.  Follow-up again in 4 to 8 weeks   Nonallopathic problems  Decision today to  treat with OMT was based on Physical Exam  After verbal consent patient was treated with HVLA, ME, FPR techniques in cervical, rib, thoracic areas  Patient tolerated the procedure well with improvement in symptoms  Patient given exercises, stretches and lifestyle modifications  See medications in patient instructions if given  Patient will follow up in 4-8 weeks      The above  documentation has been reviewed and is accurate and complete [name], DO        Note: This dictation was prepared with Dragon dictation along with smaller phrase technology. Any transcriptional errors that result from this process are unintentional.

## 2021-04-05 ENCOUNTER — Ambulatory Visit: Payer: Self-pay

## 2021-04-05 ENCOUNTER — Other Ambulatory Visit: Payer: Self-pay

## 2021-04-05 ENCOUNTER — Encounter: Payer: Self-pay | Admitting: Family Medicine

## 2021-04-05 ENCOUNTER — Ambulatory Visit: Payer: 59 | Admitting: Family Medicine

## 2021-04-05 VITALS — BP 118/88 | HR 60 | Ht 62.0 in | Wt 124.0 lb

## 2021-04-05 DIAGNOSIS — M9908 Segmental and somatic dysfunction of rib cage: Secondary | ICD-10-CM

## 2021-04-05 DIAGNOSIS — R634 Abnormal weight loss: Secondary | ICD-10-CM

## 2021-04-05 DIAGNOSIS — M9901 Segmental and somatic dysfunction of cervical region: Secondary | ICD-10-CM

## 2021-04-05 DIAGNOSIS — M25531 Pain in right wrist: Secondary | ICD-10-CM | POA: Diagnosis not present

## 2021-04-05 DIAGNOSIS — M2550 Pain in unspecified joint: Secondary | ICD-10-CM | POA: Diagnosis not present

## 2021-04-05 DIAGNOSIS — M9902 Segmental and somatic dysfunction of thoracic region: Secondary | ICD-10-CM | POA: Diagnosis not present

## 2021-04-05 DIAGNOSIS — M5030 Other cervical disc degeneration, unspecified cervical region: Secondary | ICD-10-CM

## 2021-04-05 DIAGNOSIS — M25532 Pain in left wrist: Secondary | ICD-10-CM

## 2021-04-05 LAB — COMPREHENSIVE METABOLIC PANEL
ALT: 16 U/L (ref 0–35)
AST: 26 U/L (ref 0–37)
Albumin: 4.2 g/dL (ref 3.5–5.2)
Alkaline Phosphatase: 49 U/L (ref 39–117)
BUN: 14 mg/dL (ref 6–23)
CO2: 27 mEq/L (ref 19–32)
Calcium: 9.5 mg/dL (ref 8.4–10.5)
Chloride: 104 mEq/L (ref 96–112)
Creatinine, Ser: 0.68 mg/dL (ref 0.40–1.20)
GFR: 92.63 mL/min (ref >60.00)
Glucose, Bld: 85 mg/dL (ref 70–99)
Potassium: 4.1 mEq/L (ref 3.5–5.1)
Sodium: 138 mEq/L (ref 135–145)
Total Bilirubin: 0.6 mg/dL (ref 0.2–1.2)
Total Protein: 7 g/dL (ref 6.0–8.3)

## 2021-04-05 LAB — IBC PANEL
Iron: 89 ug/dL (ref 42–145)
Saturation Ratios: 26.3 % (ref 20.0–50.0)
TIBC: 338.8 ug/dL (ref 250.0–450.0)
Transferrin: 242 mg/dL (ref 212.0–360.0)

## 2021-04-05 LAB — CBC WITH DIFFERENTIAL/PLATELET
Basophils Absolute: 0 10*3/uL (ref 0.0–0.1)
Basophils Relative: 0.8 % (ref 0.0–3.0)
Eosinophils Absolute: 0.1 10*3/uL (ref 0.0–0.7)
Eosinophils Relative: 1.9 % (ref 0.0–5.0)
HCT: 41.2 % (ref 36.0–46.0)
Hemoglobin: 13.3 g/dL (ref 12.0–15.0)
Lymphocytes Relative: 48.4 % — ABNORMAL HIGH (ref 12.0–46.0)
Lymphs Abs: 3.1 10*3/uL (ref 0.7–4.0)
MCHC: 32.4 g/dL (ref 30.0–36.0)
MCV: 94.7 fl (ref 78.0–100.0)
Monocytes Absolute: 0.5 10*3/uL (ref 0.1–1.0)
Monocytes Relative: 8.6 % (ref 3.0–12.0)
Neutro Abs: 2.5 10*3/uL (ref 1.4–7.7)
Neutrophils Relative %: 40.3 % — ABNORMAL LOW (ref 43.0–77.0)
Platelets: 309 10*3/uL (ref 150.0–400.0)
RBC: 4.35 Mil/uL (ref 3.87–5.11)
RDW: 12.7 % (ref 11.5–15.5)
WBC: 6.3 10*3/uL (ref 4.0–10.5)

## 2021-04-05 LAB — SEDIMENTATION RATE: Sed Rate: 15 mm/hr (ref 0–30)

## 2021-04-05 LAB — TSH: TSH: 1.91 u[IU]/mL (ref 0.35–5.50)

## 2021-04-05 LAB — FERRITIN: Ferritin: 199.2 ng/mL (ref 10.0–291.0)

## 2021-04-05 LAB — VITAMIN B12: Vitamin B-12: 398 pg/mL (ref 211–911)

## 2021-04-05 LAB — VITAMIN D 25 HYDROXY (VIT D DEFICIENCY, FRACTURES): VITD: 36.73 ng/mL (ref 30.00–100.00)

## 2021-04-05 LAB — URIC ACID: Uric Acid, Serum: 2.8 mg/dL (ref 2.4–7.0)

## 2021-04-05 LAB — C-REACTIVE PROTEIN: CRP: <1 mg/dL (ref 0.5–20.0)

## 2021-04-05 NOTE — Patient Instructions (Signed)
Labs today See me again in 6 weeks

## 2021-04-05 NOTE — Assessment & Plan Note (Signed)
Patient is having recurrent weight loss again.  Had a very similar presentation of medicine greater than a year ago.  We will get laboratory work-up to see if anything else is contributing.  Patient is on Wellbutrin but that would be the only medication likely to be doing something of this nature.  Follow-up again in 4 to 8 weeks

## 2021-04-05 NOTE — Assessment & Plan Note (Signed)
Patient does have more of a degenerative disc disease that is fairly severe.  Patient EMG showed that patient is wrist pain is more secondary to the carpal tunnel.  Will be having surgery at some point.  Discussed icing regimen and home exercises, discussed which activities to do which wants to avoid.  Increase activity slowly.  Follow-up again in 6 to 8 weeks.

## 2021-04-09 LAB — ANGIOTENSIN CONVERTING ENZYME: Angiotensin-Converting Enzyme: 45 U/L (ref 9–67)

## 2021-04-09 LAB — PTH, INTACT AND CALCIUM
Calcium: 9.6 mg/dL (ref 8.6–10.4)
PTH: 24 pg/mL (ref 16–77)

## 2021-04-09 LAB — RHEUMATOID FACTOR: Rheumatoid fact SerPl-aCnc: <14 IU/mL (ref <14)

## 2021-04-09 LAB — CYCLIC CITRUL PEPTIDE ANTIBODY, IGG: Cyclic Citrullin Peptide Ab: <16 UNITS

## 2021-04-09 LAB — CALCIUM, IONIZED: Calcium, Ion: 5.05 mg/dL (ref 4.8–5.6)

## 2021-04-09 LAB — ANA: Anti Nuclear Antibody (ANA): NEGATIVE

## 2021-05-05 ENCOUNTER — Other Ambulatory Visit: Payer: Self-pay

## 2021-05-05 ENCOUNTER — Other Ambulatory Visit: Payer: Self-pay | Admitting: Family Medicine

## 2021-05-05 ENCOUNTER — Other Ambulatory Visit: Payer: Self-pay | Admitting: Primary Care

## 2021-05-05 ENCOUNTER — Ambulatory Visit
Admission: RE | Admit: 2021-05-05 | Discharge: 2021-05-05 | Disposition: A | Discharge status: Home / Self Care | Payer: 59 | Source: Ambulatory Visit | Attending: Internal Medicine | Admitting: Internal Medicine

## 2021-05-05 DIAGNOSIS — Z1231 Encounter for screening mammogram for malignant neoplasm of breast: Secondary | ICD-10-CM | POA: Insufficient documentation

## 2021-05-17 NOTE — Progress Notes (Signed)
°  [place] [address] [place] Phone: [phone] Subjective:   IVilma [name], am serving as a scribe for Dr. Hulan Saas. This visit occurred during the SARS-CoV-2 public health emergency.  Safety protocols were in place, including screening questions prior to the visit, additional usage of staff PPE, and extensive cleaning of exam room while observing appropriate contact time as indicated for disinfecting solutions.   I'm seeing this patient by the request  of:  [name], NP  CC: Neck exam with neck pain  [id number]  Veronica [name] is a 66 y.o. female coming in with complaint of back and neck pain. OMT on 04/05/2021. Patient states same per usual. No new complaints  Medications patient has been prescribed: Gabapentin  Taking: Yes         Reviewed prior external information including notes and imaging from previsou exam, outside providers and external EMR if available.   As well as notes that were available from care everywhere and other healthcare systems.  Past medical history, social, surgical and family history all reviewed in electronic medical record.  No pertanent information unless stated regarding to the chief complaint.   Past Medical History:  Diagnosis Date   Depression    Dry eyes     No Known Allergies     Objective  Blood pressure 116/72, pulse 70, height 5\' 2"  (1.575 m), weight 128 lb (58.1 kg), SpO2 99 %.   General: No apparent distress alert and oriented x3 mood and affect normal, dressed appropriately.  HEENT: Pupils equal, extraocular movements intact  Respiratory: Patient's speak in full sentences and does not appear short of breath  Cardiovascular: No lower extremity edema, non tender, no erythema  Neck exam does have some loss of lordosis.  Patient does have limited sidebending bilaterally.  Increase significant tightness noted in the paraspinal musculature of the lumbar spine.   Tightness in the parascapular region bilaterally as well.  Osteopathic findings  C2 flexed rotated and side bent right C6 flexed rotated and side bent left T3 extended rotated and side bent right inhaled rib T9 extended rotated and side bent left    Assessment and Plan:  Degenerative disc disease, cervical Chronic  exacerbation noted.  Patient is doing relatively well.  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  Gabapentin seems to be doing well.  At this moment patient's pain seems to be improved.  Patient's laboratory work-up was unremarkable.  Discussed icing regimen and home exercises, discussed which activities to do and which ones to avoid.  Increase activity slowly.  Follow-up again in 6 to 8 weeks   Nonallopathic problems  Decision today to treat with OMT was based on Physical Exam  After verbal consent patient was treated with HVLA, ME, FPR techniques in cervical, rib, thoracic areas  Patient tolerated the procedure well with improvement in symptoms  Patient given exercises, stretches and lifestyle modifications  See medications in patient instructions if given  Patient will follow up in 4-8 weeks      The above documentation has been reviewed and is accurate and complete Veronica Pulley, DO        Note: This dictation was prepared with Dragon dictation along with smaller phrase technology. Any transcriptional errors that result from this process are unintentional.

## 2021-05-18 ENCOUNTER — Other Ambulatory Visit: Payer: Self-pay

## 2021-05-18 ENCOUNTER — Ambulatory Visit: Payer: 59 | Admitting: Family Medicine

## 2021-05-18 VITALS — BP 116/72 | HR 70 | Ht 62.0 in | Wt 128.0 lb

## 2021-05-18 DIAGNOSIS — M5030 Other cervical disc degeneration, unspecified cervical region: Secondary | ICD-10-CM

## 2021-05-18 DIAGNOSIS — M9901 Segmental and somatic dysfunction of cervical region: Secondary | ICD-10-CM

## 2021-05-18 DIAGNOSIS — M9902 Segmental and somatic dysfunction of thoracic region: Secondary | ICD-10-CM

## 2021-05-18 DIAGNOSIS — M9908 Segmental and somatic dysfunction of rib cage: Secondary | ICD-10-CM

## 2021-05-18 NOTE — Assessment & Plan Note (Signed)
Chronic  exacerbation noted.  Patient is doing relatively well.  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  Gabapentin seems to be doing well.  At this moment patient's pain seems to be improved.  Patient's laboratory work-up was unremarkable.  Discussed icing regimen and home exercises, discussed which activities to do and which ones to avoid.  Increase activity slowly.  Follow-up again in 6 to 8 weeks

## 2021-05-18 NOTE — Patient Instructions (Signed)
Good to see you! Thanks for letting me talk about horses See you again in 6-8 weeks

## 2021-06-22 ENCOUNTER — Other Ambulatory Visit: Payer: Self-pay | Admitting: Family Medicine

## 2021-06-22 ENCOUNTER — Other Ambulatory Visit: Payer: Self-pay | Admitting: Primary Care

## 2021-06-22 DIAGNOSIS — F32A Depression, unspecified: Secondary | ICD-10-CM

## 2021-06-28 NOTE — Progress Notes (Signed)
?  Charlann Boxer D.O. ?[facility] ?[place] ?Phone: [phone] ?Subjective:   ?I, Veronica [name], am serving as a scribe for Dr. Hulan Saas. ? ?This visit occurred during the SARS-CoV-2 public health emergency.  Safety protocols were in place, including screening questions prior to the visit, additional usage of staff PPE, and extensive cleaning of exam room while observing appropriate contact time as indicated for disinfecting solutions.  ? ? ?I'm seeing this patient by the request  of:  [name], NP ? ?CC: Neck and back pain follow-up ? ?[id number]  ?Tahlor Fowles is a 66 y.o. female coming in with complaint of back and neck pain. OMT 05/18/2021. Patient states that her neck is bothering her but nothing out of the oridinary.  ? ?Medications patient has been prescribed: Gabapentin ? ?Taking: ? ? ?  ? ? ? ? ?Reviewed prior external information including notes and imaging from previsou exam, outside providers and external EMR if available.  ? ?As well as notes that were available from care everywhere and other healthcare systems. ? ?Past medical history, social, surgical and family history all reviewed in electronic medical record.  No pertanent information unless stated regarding to the chief complaint.  ? ?Past Medical History:  ?Diagnosis Date  ? Depression   ? Dry eyes   ?  ?No Known Allergies ? ? ?Review of Systems: ? No headache, visual changes, nausea, vomiting, diarrhea, constipation, dizziness, abdominal pain, skin rash, fevers, chills, night sweats, weight loss, swollen lymph nodes, body aches, joint swelling, chest pain, shortness of breath, mood changes. POSITIVE muscle aches ? ?Objective  ?Blood pressure 122/82, pulse 65, height '5\' 2"'$  (1.575 m), weight 127 lb (57.6 kg), SpO2 98 %. ?  ?General: No apparent distress alert and oriented x3 mood and affect normal, dressed appropriately.  ?HEENT: Pupils equal, extraocular movements intact  ?Respiratory:  Patient's speak in full sentences and does not appear short of breath  ?Cardiovascular: No lower extremity edema, non tender, no erythema  ?Neck exam does have some loss of lordosis.  Some tenderness to palpation in the paraspinal musculature.  Limited sidebending bilaterally.  Patient does have tightness noted in the parascapular region as well. ? ?Osteopathic findings ? ?C2 flexed rotated and side bent right ?C6 flexed rotated and side bent left ?T3 extended rotated and side bent right inhaled rib ?T9 extended rotated and side bent left ? ? ? ? ? ?  ?Assessment and Plan: ? ?Degenerative disc disease, cervical ?Known degenerative neck disease.  Discussed with patient about the possibility of other injections.  Continue on the gabapentin and potentially the meloxicam.  Discussed icing regimen and home exercises, increase activity slowly.  Follow-up again in 6 to 8 weeks  ? ?Nonallopathic problems ? ?Decision today to treat with OMT was based on Physical Exam ? ?After verbal consent patient was treated with HVLA, ME, FPR techniques in cervical, rib, thoracic  areas ? ?Patient tolerated the procedure well with improvement in symptoms ? ?Patient given exercises, stretches and lifestyle modifications ? ?See medications in patient instructions if given ? ?Patient will follow up in 4-8 weeks ? ?  ? ? ?The above documentation has been reviewed and is accurate and complete [name], DO ? ? ? ?  ? ? Note: This dictation was prepared with Dragon dictation along with smaller phrase technology. Any transcriptional errors that result from this process are unintentional.    ?  ?  ? ?

## 2021-06-29 ENCOUNTER — Ambulatory Visit: Payer: 59 | Admitting: Family Medicine

## 2021-06-29 VITALS — BP 122/82 | HR 65 | Ht 62.0 in | Wt 127.0 lb

## 2021-06-29 DIAGNOSIS — M9901 Segmental and somatic dysfunction of cervical region: Secondary | ICD-10-CM | POA: Diagnosis not present

## 2021-06-29 DIAGNOSIS — M9902 Segmental and somatic dysfunction of thoracic region: Secondary | ICD-10-CM

## 2021-06-29 DIAGNOSIS — M9908 Segmental and somatic dysfunction of rib cage: Secondary | ICD-10-CM | POA: Diagnosis not present

## 2021-06-29 DIAGNOSIS — M5030 Other cervical disc degeneration, unspecified cervical region: Secondary | ICD-10-CM

## 2021-06-29 NOTE — Patient Instructions (Signed)
Great to see you ?Good luck with horses ?See me in 6-8 weeks ?

## 2021-06-29 NOTE — Assessment & Plan Note (Signed)
Known degenerative neck disease.  Discussed with patient about the possibility of other injections.  Continue on the gabapentin and potentially the meloxicam.  Discussed icing regimen and home exercises, increase activity slowly.  Follow-up again in 6 to 8 weeks ?

## 2021-08-08 NOTE — Progress Notes (Signed)
?Charlann Boxer D.O. ?[facility] ?[place] ?Phone: [phone] ?Subjective:   ?I, Jacqualin Combes, am serving as a scribe for Dr. Hulan Saas. ? ?This visit occurred during the SARS-CoV-2 public health emergency.  Safety protocols were in place, including screening questions prior to the visit, additional usage of staff PPE, and extensive cleaning of exam room while observing appropriate contact time as indicated for disinfecting solutions.  ? ? ?I'm seeing this patient by the request  of:  [name], NP ? ?CC: Neck and back pain follow-up ? ?[id number]  ?Lyra Fulwider is a 66 y.o. female coming in with complaint of back and neck pain. OMT 06/29/2021. Patient states that her neck is not worse or better. Lumbar spine has some soreness.  ? ?Medications patient has been prescribed: Gabapentin ? ?Taking: ? ? ?  ? ? ? ? ?Reviewed prior external information including notes and imaging from previsou exam, outside providers and external EMR if available.  ? ?As well as notes that were available from care everywhere and other healthcare systems. ? ?Past medical history, social, surgical and family history all reviewed in electronic medical record.  No pertanent information unless stated regarding to the chief complaint.  ? ?Past Medical History:  ?Diagnosis Date  ? Depression   ? Dry eyes   ?  ?No Known Allergies ? ? ?Review of Systems: ? No headache, visual changes, nausea, vomiting, diarrhea, constipation, dizziness, abdominal pain, skin rash, fevers, chills, night sweats, weight loss, swollen lymph nodes, body aches, joint swelling, chest pain, shortness of breath, mood changes. POSITIVE muscle aches ? ?Objective  ?Blood pressure 98/72, pulse 65, height '5\' 2"'$  (1.575 m), weight 126 lb (57.2 kg), SpO2 98 %. ?  ?General: No apparent distress alert and oriented x3 mood and affect normal, dressed appropriately.  ?HEENT: Pupils equal, extraocular movements intact  ?Respiratory:  Patient's speak in full sentences and does not appear short of breath  ?Cardiovascular: No lower extremity edema, non tender, no erythema  ?Neck exam does have some mild loss of lordosis.  The patient does have some limited sidebending bilaterally.  Lacks last 5 degrees of extension. ? ?Osteopathic findings ? ?C2 flexed rotated and side bent right ?C5 flexed rotated and side bent left ?T3 extended rotated and side bent right inhaled rib ?T9 extended rotated and side bent left ?L2 flexed rotated and side bent right ?Sacrum right on right ? ? ? ? ?  ?Assessment and Plan: ? ?Chronic neck and back pain ?Chronic but does relatively well with the conservative therapy.  Still taking the gabapentin fairly regularly.  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  Patient is responding well though to osteopathic manipulation.  Discussed icing regimen and home exercises, increase activity as tolerated and follow-up with me again in 6 weeks  ? ?Nonallopathic problems ? ?Decision today to treat with OMT was based on Physical Exam ? ?After verbal consent patient was treated with HVLA, ME, FPR techniques in cervical, rib, thoracic, lumbar, and sacral  areas ? ?Patient tolerated the procedure well with improvement in symptoms ? ?Patient given exercises, stretches and lifestyle modifications ? ?See medications in patient instructions if given ? ?Patient will follow up in 4-8 weeks ? ?  ? ? ?The above documentation has been reviewed and is accurate and complete [name], DO ? ? ? ?  ? ? Note: This dictation was prepared with Dragon dictation along with smaller phrase technology. Any transcriptional  errors that result from this process are unintentional.    ?  ?  ? ?

## 2021-08-09 ENCOUNTER — Ambulatory Visit: Payer: 59 | Admitting: Family Medicine

## 2021-08-09 ENCOUNTER — Encounter: Payer: Self-pay | Admitting: Family Medicine

## 2021-08-09 VITALS — BP 98/72 | HR 65 | Ht 62.0 in | Wt 126.0 lb

## 2021-08-09 DIAGNOSIS — M9901 Segmental and somatic dysfunction of cervical region: Secondary | ICD-10-CM

## 2021-08-09 DIAGNOSIS — M542 Cervicalgia: Secondary | ICD-10-CM

## 2021-08-09 DIAGNOSIS — M9904 Segmental and somatic dysfunction of sacral region: Secondary | ICD-10-CM | POA: Diagnosis not present

## 2021-08-09 DIAGNOSIS — M549 Dorsalgia, unspecified: Secondary | ICD-10-CM

## 2021-08-09 DIAGNOSIS — M9908 Segmental and somatic dysfunction of rib cage: Secondary | ICD-10-CM

## 2021-08-09 DIAGNOSIS — M9902 Segmental and somatic dysfunction of thoracic region: Secondary | ICD-10-CM | POA: Diagnosis not present

## 2021-08-09 DIAGNOSIS — M9903 Segmental and somatic dysfunction of lumbar region: Secondary | ICD-10-CM

## 2021-08-09 DIAGNOSIS — G8929 Other chronic pain: Secondary | ICD-10-CM

## 2021-08-09 NOTE — Assessment & Plan Note (Signed)
Chronic but does relatively well with the conservative therapy.  Still taking the gabapentin fairly regularly.  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  Patient is responding well though to osteopathic manipulation.  Discussed icing regimen and home exercises, increase activity as tolerated and follow-up with me again in 6 weeks ?

## 2021-08-09 NOTE — Patient Instructions (Signed)
Great to see you ?Good luck this weekend ?See me in 6-8 weeks ?

## 2021-09-01 ENCOUNTER — Other Ambulatory Visit: Payer: Self-pay | Admitting: Family Medicine

## 2021-09-19 NOTE — Progress Notes (Unsigned)
  [facility] [place] [address] [place] Phone: [phone] Subjective:   [name], am serving as a scribe for Dr. Hulan Saas.  I'm seeing this patient by the request  of:  [name], NP  CC: Back and neck pain  [id number]  Cipriana Biller is a 66 y.o. female coming in with complaint of back and neck pain. OMT on 08/09/2021. Patient states doing well, but neck and shoulder are stiff. No new complaints.  Medications patient has been prescribed: Gabapentin  Taking:         Reviewed prior external information including notes and imaging from previsou exam, outside providers and external EMR if available.   As well as notes that were available from care everywhere and other healthcare systems.  Past medical history, social, surgical and family history all reviewed in electronic medical record.  No pertanent information unless stated regarding to the chief complaint.   Past Medical History:  Diagnosis Date   Depression    Dry eyes     No Known Allergies   Review of Systems:  No headache, visual changes, nausea, vomiting, diarrhea, constipation, dizziness, abdominal pain, skin rash, fevers, chills, night sweats, weight loss, swollen lymph nodes, body aches, joint swelling, chest pain, shortness of breath, mood changes. POSITIVE muscle aches  Objective  Blood pressure 110/76, pulse (!) 58, height '5\' 2"'$  (1.575 m), weight 131 lb (59.4 kg), SpO2 99 %.   General: No apparent distress alert and oriented x3 mood and affect normal, dressed appropriately.  HEENT: Pupils equal, extraocular movements intact  Respiratory: Patient's speak in full sentences and does not appear short of breath  Cardiovascular: No lower extremity edema, non tender, no erythema  MSK:  Back back exam does have some mild loss of lordosis.  Patient since last visit has put on some mild increase in muscle.  Still has some limited range of motion  noted Neck exam does have some limited sidebending bilaterally.  Osteopathic findings  C2 flexed rotated and side bent right C5 flexed rotated and side bent left T3 extended rotated and side bent right inhaled rib T5 extended rotated and side bent left L2 flexed rotated and side bent right Sacrum right on right       Assessment and Plan:  Chronic neck and back pain Continue chronic problem.  Discussed icing regimen and home exercises.  Patient does have meloxicam for breakthrough and is not taking on a daily basis.  Increase activity slowly otherwise.  Follow-up with me again in 6 to 8 weeks.    Nonallopathic problems  Decision today to treat with OMT was based on Physical Exam  After verbal consent patient was treated with HVLA, ME, FPR techniques in cervical, rib, thoracic, lumbar, and sacral  areas  Patient tolerated the procedure well with improvement in symptoms  Patient given exercises, stretches and lifestyle modifications  See medications in patient instructions if given  Patient will follow up in 4-8 weeks             Note: This dictation was prepared with Dragon dictation along with smaller phrase technology. Any transcriptional errors that result from this process are unintentional.

## 2021-09-20 ENCOUNTER — Encounter: Payer: Self-pay | Admitting: Family Medicine

## 2021-09-20 ENCOUNTER — Ambulatory Visit: Payer: 59 | Admitting: Family Medicine

## 2021-09-20 VITALS — BP 110/76 | HR 58 | Ht 62.0 in | Wt 131.0 lb

## 2021-09-20 DIAGNOSIS — M542 Cervicalgia: Secondary | ICD-10-CM | POA: Diagnosis not present

## 2021-09-20 DIAGNOSIS — M9904 Segmental and somatic dysfunction of sacral region: Secondary | ICD-10-CM | POA: Diagnosis not present

## 2021-09-20 DIAGNOSIS — M549 Dorsalgia, unspecified: Secondary | ICD-10-CM

## 2021-09-20 DIAGNOSIS — M9903 Segmental and somatic dysfunction of lumbar region: Secondary | ICD-10-CM

## 2021-09-20 DIAGNOSIS — M9901 Segmental and somatic dysfunction of cervical region: Secondary | ICD-10-CM | POA: Diagnosis not present

## 2021-09-20 DIAGNOSIS — M9908 Segmental and somatic dysfunction of rib cage: Secondary | ICD-10-CM | POA: Diagnosis not present

## 2021-09-20 DIAGNOSIS — G8929 Other chronic pain: Secondary | ICD-10-CM

## 2021-09-20 DIAGNOSIS — M9902 Segmental and somatic dysfunction of thoracic region: Secondary | ICD-10-CM

## 2021-09-20 NOTE — Patient Instructions (Signed)
Good to see you! Good luck in [place], hope you hit it big See you again in 1-2 months

## 2021-09-20 NOTE — Assessment & Plan Note (Signed)
Continue chronic problem.  Discussed icing regimen and home exercises.  Patient does have meloxicam for breakthrough and is not taking on a daily basis.  Increase activity slowly otherwise.  Follow-up with me again in 6 to 8 weeks.

## 2021-09-26 ENCOUNTER — Other Ambulatory Visit: Payer: Self-pay

## 2021-09-26 DIAGNOSIS — F419 Anxiety disorder, unspecified: Secondary | ICD-10-CM

## 2021-09-26 MED ORDER — BUPROPION HCL ER (XL) 150 MG PO TB24: 150.0000 mg | ORAL_TABLET | Freq: Every day | ORAL | 0 refills | Status: DC

## 2021-10-18 ENCOUNTER — Encounter: Payer: 59 | Admitting: Primary Care

## 2021-10-31 ENCOUNTER — Other Ambulatory Visit: Payer: Self-pay | Admitting: Family Medicine

## 2021-11-02 NOTE — Progress Notes (Unsigned)
[place] Phone: [phone] Subjective:   Veronica [name], am serving as a scribe for Dr. Hulan Saas.  I'm seeing this patient by the request  of:  [name], NP  CC: Right wrist, neck pain and neck pain follow-up  [id number]  Veronica [name] is a 66 y.o. female coming in with complaint of back and neck pain. OMT on 09/20/2021. Patient states that she is having more pain in upper thoracic spine.   Also having R wrist and hand numbness. Saw Dr. Amedeo Plenty for injection and would like one today. Is trying to find time to get surgery.   Medications patient has been prescribed: Gabapentin  Taking:         Reviewed prior external information including notes and imaging from previsou exam, outside providers and external EMR if available.   As well as notes that were available from care everywhere and other healthcare systems.  Past medical history, social, surgical and family history all reviewed in electronic medical record.  [name]   Past Medical History:  Diagnosis Date   COVID-19 virus infection 04/26/2020   Depression    Dry eyes     [name]   Review of Systems:  [name], visual changes, nausea, vomiting, diarrhea, constipation, dizziness, abdominal pain, skin rash, fevers, chills, night sweats, weight loss, swollen lymph nodes, body aches, joint swelling, chest pain, shortness of breath, mood changes. POSITIVE muscle aches  Objective  Blood pressure 112/80, pulse 60, height '5\' 2"'$  (1.575 m), weight 127 lb (57.6 kg), SpO2 99 %.   General: [name], dressed appropriately.  HEENT: Pupils equal, extraocular movements intact  Respiratory: Patient's speak in full sentences and does not appear short of breath  Cardiovascular: [name], non tender,  [name]  Gait normal  MSK:  Neck exam does have some limited sidebending bilaterally.  Tender to palpation noted.  Negative Spurling's.  Positive Tinel's at the wrist.  Patient does have some thenar eminence wasting.  Osteopathic findings  C2 flexed rotated and side bent right C6 flexed rotated and side bent left T3 extended rotated and side bent right inhaled rib T9 extended rotated and side bent left L2 flexed rotated and side bent right Sacrum right on right  Procedure: Real-time Ultrasound Guided Injection of right carpal tunnel Device: GE Logiq Q7  Ultrasound guided injection is preferred based studies that show increased duration, increased effect, greater accuracy, decreased procedural pain, increased response rate with ultrasound guided versus blind injection.  Verbal informed consent obtained.  Time-out conducted.  Noted [name] overlying erythema, induration, or other signs of local infection.  Skin prepped in a sterile fashion.  Local anesthesia: Topical Ethyl chloride.  With sterile technique and under real time ultrasound guidance:  median nerve visualized.  23g 5/8 inch needle inserted distal to proximal approach into nerve sheath. Pictures taken nfor needle placement. Patient did have injection of 0.5 cc of 0.5% Marcaine, and 0.5 cc of Kenalog 40 mg/dL. Completed without difficulty  Pain immediately improved  suggesting accurate placement of the medication.  Advised to call if fevers/chills, erythema, induration, drainage, or persistent bleeding.  Impression: Technically successful ultrasound guided injection.     Assessment and Plan:  Degenerative disc disease, cervical Still has responded relatively well to osteopathic manipulation.  Patient has had a couple epidurals in  the neck.  Chronic problem with mild exacerbation symptoms seem somewhat controlled with the gabapentin.  Also does well with the relation.  Follow-up again in 6 to 7 weeks.  Right carpal tunnel  syndrome Repeat injection given. Chronic problem with exacerbation.  Will consider surgical intervention but wants to wait until fall if not winter.  Follow-up with me again in 6 to 8 weeks    Nonallopathic problems  Decision today to treat with OMT was based on Physical Exam  After verbal consent patient was treated with HVLA, ME, FPR techniques in cervical, rib, thoracic, lumbar, and sacral  areas  Patient tolerated the procedure well with improvement in symptoms  Patient given exercises, stretches and lifestyle modifications  See medications in patient instructions if given  Patient will follow up in 4-8 weeks    The above documentation has been reviewed and is accurate and complete [name], DO          Note: This dictation was prepared with Dragon dictation along with smaller phrase technology. Any transcriptional errors that result from this process are unintentional.

## 2021-11-07 ENCOUNTER — Encounter: Payer: Self-pay | Admitting: Primary Care

## 2021-11-07 ENCOUNTER — Ambulatory Visit (INDEPENDENT_AMBULATORY_CARE_PROVIDER_SITE_OTHER): Payer: 59 | Admitting: Primary Care

## 2021-11-07 ENCOUNTER — Ambulatory Visit: Payer: 59 | Admitting: Family Medicine

## 2021-11-07 ENCOUNTER — Ambulatory Visit: Payer: Self-pay

## 2021-11-07 VITALS — BP 112/80 | HR 60 | Ht 62.0 in | Wt 127.0 lb

## 2021-11-07 VITALS — BP 130/72 | HR 66 | Temp 98.6°F | Ht 62.0 in | Wt 129.0 lb

## 2021-11-07 DIAGNOSIS — G8929 Other chronic pain: Secondary | ICD-10-CM

## 2021-11-07 DIAGNOSIS — Z0000 Encounter for general adult medical examination without abnormal findings: Secondary | ICD-10-CM

## 2021-11-07 DIAGNOSIS — M9901 Segmental and somatic dysfunction of cervical region: Secondary | ICD-10-CM

## 2021-11-07 DIAGNOSIS — M5030 Other cervical disc degeneration, unspecified cervical region: Secondary | ICD-10-CM

## 2021-11-07 DIAGNOSIS — M9904 Segmental and somatic dysfunction of sacral region: Secondary | ICD-10-CM

## 2021-11-07 DIAGNOSIS — M542 Cervicalgia: Secondary | ICD-10-CM

## 2021-11-07 DIAGNOSIS — M9908 Segmental and somatic dysfunction of rib cage: Secondary | ICD-10-CM

## 2021-11-07 DIAGNOSIS — M9903 Segmental and somatic dysfunction of lumbar region: Secondary | ICD-10-CM

## 2021-11-07 DIAGNOSIS — M159 Polyosteoarthritis, unspecified: Secondary | ICD-10-CM | POA: Diagnosis not present

## 2021-11-07 DIAGNOSIS — F419 Anxiety disorder, unspecified: Secondary | ICD-10-CM

## 2021-11-07 DIAGNOSIS — M25531 Pain in right wrist: Secondary | ICD-10-CM | POA: Diagnosis not present

## 2021-11-07 DIAGNOSIS — F32A Depression, unspecified: Secondary | ICD-10-CM

## 2021-11-07 DIAGNOSIS — M9902 Segmental and somatic dysfunction of thoracic region: Secondary | ICD-10-CM

## 2021-11-07 DIAGNOSIS — G5601 Carpal tunnel syndrome, right upper limb: Secondary | ICD-10-CM

## 2021-11-07 DIAGNOSIS — M549 Dorsalgia, unspecified: Secondary | ICD-10-CM

## 2021-11-07 DIAGNOSIS — G43701 Chronic migraine without aura, not intractable, with status migrainosus: Secondary | ICD-10-CM

## 2021-11-07 MED ORDER — CELECOXIB 100 MG PO CAPS: 100.0000 mg | ORAL_CAPSULE | Freq: Two times a day (BID) | ORAL | 0 refills | Status: DC | PRN

## 2021-11-07 NOTE — Progress Notes (Signed)
Subjective:    Patient ID: Veronica [name], female    DOB: [birth year], 66 y.o.   MRN: [id number]  HPI  Veronica [name] is a very pleasant 66 y.o. female who presents today for complete physical and follow up of chronic conditions.  She would like to try Celebrex for her chronic joint pain and arthritis. Follows with sports medicine and neurology. Most of her joint pain is located to her bilateral hands, neck, and bilateral knees. She's tried Meloxicam, Ibuprofen, and Tylenol without much improvement.   Immunizations: -Tetanus: 2019 -Influenza: Due this season  -Covid-19: 2 vaccines -Shingles: Never completed, declines   Diet: Fair diet.  Exercise: No regular exercise.  Eye exam: Completes annually  Dental exam: Completes semi-annually   Pap Smear: Completed in 2021 Mammogram: Completed in February 2023  Colonoscopy: Completed in 2014, due 2024   BP Readings from Last 3 Encounters:  11/07/21 130/72  09/20/21 110/76  08/09/21 98/72         Review of Systems  Constitutional:  Negative for unexpected weight change.  HENT:  Negative for rhinorrhea.   Eyes:  Negative for visual disturbance.  Respiratory:  Negative for cough and shortness of breath.   Cardiovascular:  Negative for chest pain.  Gastrointestinal:  Negative for constipation and diarrhea.  Genitourinary:  Negative for difficulty urinating and menstrual problem.  Musculoskeletal:  Positive for arthralgias.  Skin:  Negative for rash.  Allergic/Immunologic: Negative for environmental allergies.  Neurological:  Negative for dizziness and headaches.  Psychiatric/Behavioral:  The patient is not nervous/anxious.          Past Medical History:  Diagnosis Date   COVID-19 virus infection 04/26/2020   Depression    Dry eyes     Social History   Socioeconomic History   Marital status: Married    Spouse name: Not on file   Number of children: Not on file   Years of education: Not on file   Highest education  level: Not on file  Occupational History   Not on file  Tobacco Use   Smoking status: Former    Types: Cigarettes    Quit date: 06/11/1988    Years since quitting: 33.4   Smokeless tobacco: Never  Substance and Sexual Activity   Alcohol use: Yes    Alcohol/week: 6.0 standard drinks of alcohol    Types: 6 Glasses of wine per week   Drug use: No   Sexual activity: Not on file  Other Topics Concern   Not on file  Social History Narrative   Married.   No children.   Works for [employer].   Enjoys showing and riding horses.    Social Determinants of Health   Financial Resource Strain: Not on file  Food Insecurity: Not on file  Transportation Needs: Not on file  Physical Activity: Not on file  Stress: Not on file  Social Connections: Not on file  Intimate Partner Violence: Not on file    Past Surgical History:  Procedure Laterality Date   CLOSED REDUCTION HAND FRACTURE  2002   left   LASIK      Family History  Problem Relation Age of Onset   Hypertension Mother    Heart disease Mother    COPD Father    Colon cancer Neg Hx     No Known Allergies  Current Outpatient Medications on File Prior to Visit  Medication Sig Dispense Refill   buPROPion (WELLBUTRIN XL) 150 MG 24 hr tablet Take 1  tablet (150 mg total) by mouth daily. For anxiety and depression. Office visit required for further refills. 90 tablet 0   Diclofenac Sodium (PENNSAID) 2 % SOLN Place 1 application onto the skin 2 (two) times daily. 1 Bottle 3   gabapentin (NEURONTIN) 100 MG capsule TAKE 3 CAPSULES AT BEDTIME 180 capsule 0   SUMAtriptan-naproxen (TREXIMET) 85-500 MG tablet Take 1 tablet by mouth at migraine onset. May repeat in 2 hours if migraine persists, do not exceed 2 tablets in 24 hours. 10 tablet 0   triamcinolone cream (KENALOG) 0.1 % Apply topically as needed.     No current facility-administered medications on file prior to visit.    BP 130/72   Pulse 66   Temp 98.6 F (37  C) (Oral)   Ht '5\' 2"'$  (1.575 m)   Wt 129 lb (58.5 kg)   SpO2 99%   BMI 23.59 kg/m  Objective:   Physical Exam HENT:     Right Ear: Tympanic membrane and ear canal normal.     Left Ear: Tympanic membrane and ear canal normal.     Nose: Nose normal.  Eyes:     Conjunctiva/sclera: Conjunctivae normal.     Pupils: Pupils are equal, round, and reactive to light.  Neck:     Thyroid: No thyromegaly.  Cardiovascular:     Rate and Rhythm: Normal rate and regular rhythm.     Heart sounds: No murmur heard. Pulmonary:     Effort: Pulmonary effort is normal.     Breath sounds: Normal breath sounds. No rales.  Abdominal:     General: Bowel sounds are normal.     Palpations: Abdomen is soft.     Tenderness: There is no abdominal tenderness.  Musculoskeletal:        General: Normal range of motion.     Cervical back: Neck supple.  Lymphadenopathy:     Cervical: No cervical adenopathy.  Skin:    General: Skin is warm and dry.     Findings: No rash.  Neurological:     Mental Status: She is alert and oriented to person, place, and time.     Cranial Nerves: No cranial nerve deficit.     Deep Tendon Reflexes: Reflexes are normal and symmetric.  Psychiatric:        Mood and Affect: Mood normal.           Assessment & Plan:   Problem List Items Addressed This Visit       Cardiovascular and Mediastinum   Chronic migraine without aura with status migrainosus, not intractable    Controlled. No recent migraines.  Continue sumatriptan-naproxen 85-500 mg PRN.      Relevant Medications   celecoxib (CELEBREX) 100 MG capsule     Musculoskeletal and Integument   Osteoarthritis of multiple joints    Agree to provide Rx for celebrex. Discussed to use PRN. Renal function reviewed from Sports Medicine in January 2023.  Start celebrex 100 mg BID PRN.      Relevant Medications   celecoxib (CELEBREX) 100 MG capsule     Other   Anxiety and depression    Controlled.  Continue  bupropion XL 150 mg daily      Preventative health care - Primary    Immunizations UTD. Mammogram UTD. Pap smear UTD. Colonoscopy UTD, due 2024  Discussed the importance of a healthy diet and regular exercise in order for weight loss, and to reduce the risk of further co-morbidity.  Exam stable. Labs pending.  Follow up in 1 year for repeat physical.       Chronic neck and back pain    Following with sports medicine.  Continue gabapentin 300 mg HS.      Relevant Medications   celecoxib (CELEBREX) 100 MG capsule       [name], NP

## 2021-11-07 NOTE — Assessment & Plan Note (Signed)
Controlled. No recent migraines.  Continue sumatriptan-naproxen 85-500 mg PRN.

## 2021-11-07 NOTE — Patient Instructions (Signed)
Injected wrist today See me again in 8 weeks

## 2021-11-07 NOTE — Assessment & Plan Note (Signed)
Agree to provide Rx for celebrex. Discussed to use PRN. Renal function reviewed from Sports Medicine in January 2023.  Start celebrex 100 mg BID PRN.

## 2021-11-07 NOTE — Assessment & Plan Note (Signed)
Following with sports medicine.  Continue gabapentin 300 mg HS.

## 2021-11-07 NOTE — Assessment & Plan Note (Signed)
Immunizations UTD. Mammogram UTD. Pap smear UTD. Colonoscopy UTD, due 2024  Discussed the importance of a healthy diet and regular exercise in order for weight loss, and to reduce the risk of further co-morbidity.  Exam stable. Labs pending.  Follow up in 1 year for repeat physical.

## 2021-11-07 NOTE — Patient Instructions (Signed)
You may take Celebrex 100 mg twice daily as needed for joint pain. Make sure to take with food.  It was a pleasure to see you today!  Preventive Care 63-64 Years Old, Female Preventive care refers to lifestyle choices and visits with your health care provider that can promote health and wellness. Preventive care visits are also called wellness exams. What can I expect for my preventive care visit? Counseling Your health care provider may ask you questions about your: Medical history, including: Past medical problems. Family medical history. Pregnancy history. Current health, including: Menstrual cycle. Method of birth control. Emotional well-being. Home life and relationship well-being. Sexual activity and sexual health. Lifestyle, including: Alcohol, nicotine or tobacco, and drug use. Access to firearms. Diet, exercise, and sleep habits. Work and work Statistician. Sunscreen use. Safety issues such as seatbelt and bike helmet use. Physical exam Your health care provider will check your: Height and weight. These may be used to calculate your BMI (body mass index). BMI is a measurement that tells if you are at a healthy weight. Waist circumference. This measures the distance around your waistline. This measurement also tells if you are at a healthy weight and may help predict your risk of certain diseases, such as type 2 diabetes and high blood pressure. Heart rate and blood pressure. Body temperature. Skin for abnormal spots. What immunizations do I need?  Vaccines are usually given at various ages, according to a schedule. Your health care provider will recommend vaccines for you based on your age, medical history, and lifestyle or other factors, such as travel or where you work. What tests do I need? Screening Your health care provider may recommend screening tests for certain conditions. This may include: Lipid and cholesterol levels. Diabetes screening. This is done by  checking your blood sugar (glucose) after you have not eaten for a while (fasting). Pelvic exam and Pap test. Hepatitis B test. Hepatitis C test. HIV (human immunodeficiency virus) test. STI (sexually transmitted infection) testing, if you are at risk. Lung cancer screening. Colorectal cancer screening. Mammogram. Talk with your health care provider about when you should start having regular mammograms. This may depend on whether you have a family history of breast cancer. BRCA-related cancer screening. This may be done if you have a family history of breast, ovarian, tubal, or peritoneal cancers. Bone density scan. This is done to screen for osteoporosis. Talk with your health care provider about your test results, treatment options, and if necessary, the need for more tests. Follow these instructions at home: Eating and drinking  Eat a diet that includes fresh fruits and vegetables, whole grains, lean protein, and low-fat dairy products. Take vitamin and mineral supplements as recommended by your health care provider. Do not drink alcohol if: Your health care provider tells you not to drink. You are pregnant, may be pregnant, or are planning to become pregnant. If you drink alcohol: Limit how much you have to 0-1 drink a day. Know how much alcohol is in your drink. In the [place] one drink equals one 12 oz bottle of beer (355 mL), one 5 oz glass of wine (148 mL), or one 1 oz glass of hard liquor (44 mL). Lifestyle Brush your teeth every morning and night with fluoride toothpaste. Floss one time each day. Exercise for at least 30 minutes 5 or more days each week. Do not use any products that contain nicotine or tobacco. These products include cigarettes, chewing tobacco, and vaping devices, such as e-cigarettes. If you  need help quitting, ask your health care provider. Do not use drugs. If you are sexually active, practice safe sex. Use a condom or other form of protection to prevent  STIs. If you do not wish to become pregnant, use a form of birth control. If you plan to become pregnant, see your health care provider for a prepregnancy visit. Take aspirin only as told by your health care provider. Make sure that you understand how much to take and what form to take. Work with your health care provider to find out whether it is safe and beneficial for you to take aspirin daily. Find healthy ways to manage stress, such as: Meditation, yoga, or listening to music. Journaling. Talking to a trusted person. Spending time with friends and family. Minimize exposure to UV radiation to reduce your risk of skin cancer. Safety Always wear your seat belt while driving or riding in a vehicle. Do not drive: If you have been drinking alcohol. Do not ride with someone who has been drinking. When you are tired or distracted. While texting. If you have been using any mind-altering substances or drugs. Wear a helmet and other protective equipment during sports activities. If you have firearms in your house, make sure you follow all gun safety procedures. Seek help if you have been physically or sexually abused. What's next? Visit your health care provider once a year for an annual wellness visit. Ask your health care provider how often you should have your eyes and teeth checked. Stay up to date on all vaccines. This information is not intended to replace advice given to you by your health care provider. Make sure you discuss any questions you have with your health care provider. Document Revised: 09/14/2020 Document Reviewed: 09/14/2020 Elsevier Patient Education  [place].

## 2021-11-07 NOTE — Assessment & Plan Note (Signed)
Controlled.  Continue bupropion XL 150 mg daily.  

## 2021-11-08 NOTE — Assessment & Plan Note (Signed)
Still has responded relatively well to osteopathic manipulation.  Patient has had a couple epidurals in the neck.  Chronic problem with mild exacerbation symptoms seem somewhat controlled with the gabapentin.  Also does well with the relation.  Follow-up again in 6 to 7 weeks.

## 2021-11-08 NOTE — Assessment & Plan Note (Signed)
Repeat injection given. Chronic problem with exacerbation.  Will consider surgical intervention but wants to wait until fall if not winter.  Follow-up with me again in 6 to 8 weeks

## 2021-12-20 NOTE — Progress Notes (Unsigned)
  Veronica [name] Phone: [phone] Subjective:   Veronica [name], am serving as a scribe for Dr. Hulan Saas.  I'm seeing this patient by the request  of:  [name], NP  CC: Neck pain follow-up  [id number]  Veronica [name] is a 66 y.o. female coming in with complaint of back and neck pain. OMT 11/07/2021. Also f/u for R wrist pain. Patient states that for past 2-3 weeks she has had L sided neck pain and into scapula. Had massage and using Advil.   Medications patient has been prescribed: Gabapentin  Taking:         Reviewed prior external information including notes and imaging from previsou exam, outside providers and external EMR if available.   As well as notes that were available from care everywhere and other healthcare systems.  Past medical history, social, surgical and family history all reviewed in electronic medical record.  [name]   Past Medical History:  Diagnosis Date   COVID-19 virus infection 04/26/2020   Depression    Dry eyes     [name]   Review of Systems:  [name], visual changes, nausea, vomiting, diarrhea, constipation, dizziness, abdominal pain, skin rash, fevers, chills, night sweats, weight loss, swollen lymph nodes, body aches, joint swelling, chest pain, shortness of breath, mood changes. POSITIVE muscle aches  Objective  Blood pressure 138/82, pulse 69, height '5\' 2"'$  (1.575 m), weight 128 lb (58.1 kg), SpO2 98 %.   General: [name], dressed appropriately.  HEENT: Pupils equal, extraocular movements intact  Respiratory: Patient's speak in full sentences and does not appear short of breath  Cardiovascular: [name], non tender, [name]  MSK: Neck exam still has some loss of lordosis.  Some tenderness to palpation  over the knee noted as well.  Negative Spurling's though.  Patient has limited extension of the neck noted as well.  Osteopathic findings  C2 flexed rotated and side bent left C6 flexed rotated and side bent left T3 extended rotated and side bent left inhaled rib T10 extended rotated and side bent left        Assessment and Plan:  Degenerative disc disease, cervical Known degenerative disc disease.  Continues to have discomfort and pain.  Seems to be mostly on the left side.  Discussed posture and ergonomics.  Discussed which activities to do and which ones to avoid.  Increase activity slowly otherwise.  Follow-up again in 6 to 8 weeks.    Nonallopathic problems  Decision today to treat with OMT was based on Physical Exam  After verbal consent patient was treated with HVLA, ME, FPR techniques in cervical, rib, thoracic, areas  Patient tolerated the procedure well with improvement in symptoms  Patient given exercises, stretches and lifestyle modifications  See medications in patient instructions if given  Patient will follow up in 4-8 weeks     The above documentation has been reviewed and is accurate and complete [name], DO         Note: This dictation was prepared with Dragon dictation along with smaller phrase technology. Any transcriptional errors that result from this process are unintentional.

## 2021-12-21 ENCOUNTER — Ambulatory Visit: Payer: Self-pay

## 2021-12-21 ENCOUNTER — Ambulatory Visit: Payer: 59 | Admitting: Family Medicine

## 2021-12-21 VITALS — BP 138/82 | HR 69 | Ht 62.0 in | Wt 128.0 lb

## 2021-12-21 DIAGNOSIS — M9908 Segmental and somatic dysfunction of rib cage: Secondary | ICD-10-CM | POA: Diagnosis not present

## 2021-12-21 DIAGNOSIS — M5030 Other cervical disc degeneration, unspecified cervical region: Secondary | ICD-10-CM | POA: Diagnosis not present

## 2021-12-21 DIAGNOSIS — M9901 Segmental and somatic dysfunction of cervical region: Secondary | ICD-10-CM | POA: Diagnosis not present

## 2021-12-21 DIAGNOSIS — M25531 Pain in right wrist: Secondary | ICD-10-CM

## 2021-12-21 DIAGNOSIS — M9902 Segmental and somatic dysfunction of thoracic region: Secondary | ICD-10-CM | POA: Diagnosis not present

## 2021-12-21 NOTE — Assessment & Plan Note (Signed)
Known degenerative disc disease.  Continues to have discomfort and pain.  Seems to be mostly on the left side.  Discussed posture and ergonomics.  Discussed which activities to do and which ones to avoid.  Increase activity slowly otherwise.  Follow-up again in 6 to 8 weeks.

## 2021-12-21 NOTE — Patient Instructions (Signed)
Good to see you Good luck at show See me in 6-8 weeks

## 2021-12-30 ENCOUNTER — Other Ambulatory Visit: Payer: Self-pay | Admitting: Family Medicine

## 2022-01-02 ENCOUNTER — Ambulatory Visit (INDEPENDENT_AMBULATORY_CARE_PROVIDER_SITE_OTHER): Payer: 59

## 2022-01-02 DIAGNOSIS — Z23 Encounter for immunization: Secondary | ICD-10-CM | POA: Diagnosis not present

## 2022-01-20 ENCOUNTER — Other Ambulatory Visit: Payer: Self-pay | Admitting: Primary Care

## 2022-01-20 DIAGNOSIS — F419 Anxiety disorder, unspecified: Secondary | ICD-10-CM

## 2022-01-31 NOTE — Progress Notes (Signed)
[place] Phone: [phone] Subjective:   Veronica [name], am serving as a scribe for Dr. Hulan Saas.  I'm seeing this patient by the request  of:  Veronica Koch, NP  CC: Back and neck pain follow-up, wrist pain follow-up  [id number]  Veronica [name] is a 66 y.o. female coming in with complaint of back and neck pain. OMT 11/07/2021. Patient states that she is going to have surgery on Dec 13th R side. L wrist is also bothering her but enough where she wants injection.  Patient has been very active.  Patient is riding in the competitions with her parents.  Has been traveling a lot in cars as well that seems to have aggravated it.  Medications patient has been prescribed: Gabapentin  Taking:         Reviewed prior external information including notes and imaging from previsou exam, outside providers and external EMR if available.   As well as notes that were available from care everywhere and other healthcare systems.  Past medical history, social, surgical and family history all reviewed in electronic medical record.  [name]   Past Medical History:  Diagnosis Date   COVID-19 virus infection 04/26/2020   Depression    Dry eyes     [name]   Review of Systems:  [name], visual changes, nausea, vomiting, diarrhea, constipation, dizziness, abdominal pain, skin rash, fevers, chills, night sweats, weight loss, swollen lymph nodes, body aches, joint swelling, chest pain, shortness of breath, mood changes. POSITIVE muscle aches  Objective  Blood pressure 122/78, pulse 61, height '5\' 2"'$  (1.575 m), weight 125 lb (56.7 kg), SpO2 98 %.   General: [name], dressed appropriately.  HEENT: Pupils equal, extraocular movements intact  Respiratory: Patient's speak in full  sentences and does not appear short of breath  Cardiovascular: [name], non tender, [name]  Gait normal  MSK:  Back does have some loss of lordosis noted.  Patient does have tenderness to palpation noted of the neck bilaterally.  Low back does have tightness noted in the left paraspinal musculature  Osteopathic findings  C2 flexed rotated and side bent right C6 flexed rotated and side bent left T3 extended rotated and side bent right inhaled rib T7 extended rotated and side bent left L3 flexed rotated and side bent left Sacrum right on right  Procedure: Real-time Ultrasound Guided Injection of left carpal tunnel Device: GE Logiq Q7 Ultrasound guided injection is preferred based studies that show increased duration, increased effect, greater accuracy, decreased procedural pain, increased response rate with ultrasound guided versus blind injection.  Verbal informed consent obtained.  Time-out conducted.  Noted [name] overlying erythema, induration, or other signs of local infection.  Skin prepped in a sterile fashion.  Local anesthesia: Topical Ethyl chloride.  With sterile technique and under real time ultrasound guidance:  median nerve visualized.  23g 5/8 inch needle inserted distal to proximal approach into nerve sheath. Pictures taken nfor needle placement. Patient did have injection of 0.5 cc of 0.5% Marcaine, and 0.5 cc of Kenalog 40 mg/dL. Completed without difficulty  Pain immediately resolved suggesting accurate placement of the medication.  Advised to call if fevers/chills, erythema, induration, drainage, or persistent bleeding.  Impression: Technically successful ultrasound guided injection.     Assessment and Plan:  Degenerative disc disease,  cervical Chronic problem.  Patient has been able to be little active.  Discussed the gabapentin she has been using very intermittently.  Discussed icing regimen and home exercises.  Discussed sleeping dilution.   Will be driving a lot and would like to see her again in 4 to 5 weeks in case she has a very long drive coming up in the near future.  Left carpal tunnel syndrome Been over a year since we given an injection.  Patient is going to have the right side carpal tunnel surgery in December.  Hopefully this makes significant improvement.  Follow-up again in 6 to 8 weeks otherwise.  Discussed bracing at night for this chronic problem that is worsening.    Nonallopathic problems  Decision today to treat with OMT was based on Physical Exam  After verbal consent patient was treated with HVLA, ME, FPR techniques in cervical, rib, thoracic, lumbar, and sacral  areas  Patient tolerated the procedure well with improvement in symptoms  Patient given exercises, stretches and lifestyle modifications  See medications in patient instructions if given  Patient will follow up in 4-8 weeks     The above documentation has been reviewed and is accurate and complete [name], DO         Note: This dictation was prepared with Dragon dictation along with smaller phrase technology. Any transcriptional errors that result from this process are unintentional.

## 2022-02-05 ENCOUNTER — Ambulatory Visit: Payer: Self-pay

## 2022-02-05 ENCOUNTER — Encounter: Payer: Self-pay | Admitting: Family Medicine

## 2022-02-05 ENCOUNTER — Ambulatory Visit: Payer: 59 | Admitting: Family Medicine

## 2022-02-05 VITALS — BP 122/78 | HR 61 | Ht 62.0 in | Wt 125.0 lb

## 2022-02-05 DIAGNOSIS — M9908 Segmental and somatic dysfunction of rib cage: Secondary | ICD-10-CM | POA: Diagnosis not present

## 2022-02-05 DIAGNOSIS — M9901 Segmental and somatic dysfunction of cervical region: Secondary | ICD-10-CM | POA: Diagnosis not present

## 2022-02-05 DIAGNOSIS — M5030 Other cervical disc degeneration, unspecified cervical region: Secondary | ICD-10-CM

## 2022-02-05 DIAGNOSIS — M9904 Segmental and somatic dysfunction of sacral region: Secondary | ICD-10-CM

## 2022-02-05 DIAGNOSIS — M9902 Segmental and somatic dysfunction of thoracic region: Secondary | ICD-10-CM

## 2022-02-05 DIAGNOSIS — G5602 Carpal tunnel syndrome, left upper limb: Secondary | ICD-10-CM

## 2022-02-05 DIAGNOSIS — M25532 Pain in left wrist: Secondary | ICD-10-CM | POA: Diagnosis not present

## 2022-02-05 DIAGNOSIS — M9903 Segmental and somatic dysfunction of lumbar region: Secondary | ICD-10-CM | POA: Diagnosis not present

## 2022-02-05 MED ORDER — PREDNISONE 20 MG PO TABS: 40.0000 mg | ORAL_TABLET | Freq: Every day | ORAL | 0 refills | Status: DC

## 2022-02-05 NOTE — Assessment & Plan Note (Signed)
Chronic problem.  Patient has been able to be little active.  Discussed the gabapentin she has been using very intermittently.  Discussed icing regimen and home exercises.  Discussed sleeping dilution.  Will be driving a lot and would like to see her again in 4 to 5 weeks in case she has a very long drive coming up in the near future.

## 2022-02-05 NOTE — Assessment & Plan Note (Signed)
Been over a year since we given an injection.  Patient is going to have the right side carpal tunnel surgery in December.  Hopefully this makes significant improvement.  Follow-up again in 6 to 8 weeks otherwise.  Discussed bracing at night for this chronic problem that is worsening.

## 2022-02-05 NOTE — Patient Instructions (Addendum)
Injected L wrist today Fingers crossed for the event Prednisone called in See me again in 4-5 weeks

## 2022-02-26 IMAGING — DX DG CERVICAL SPINE COMPLETE 4+V
5 series · 5 of 5 positions shown · non-contrast
Comparison: None.

CLINICAL DATA: Chronic cervicalgia

EXAM:
CERVICAL SPINE - COMPLETE 4+ VIEW

[cervical spine lat]
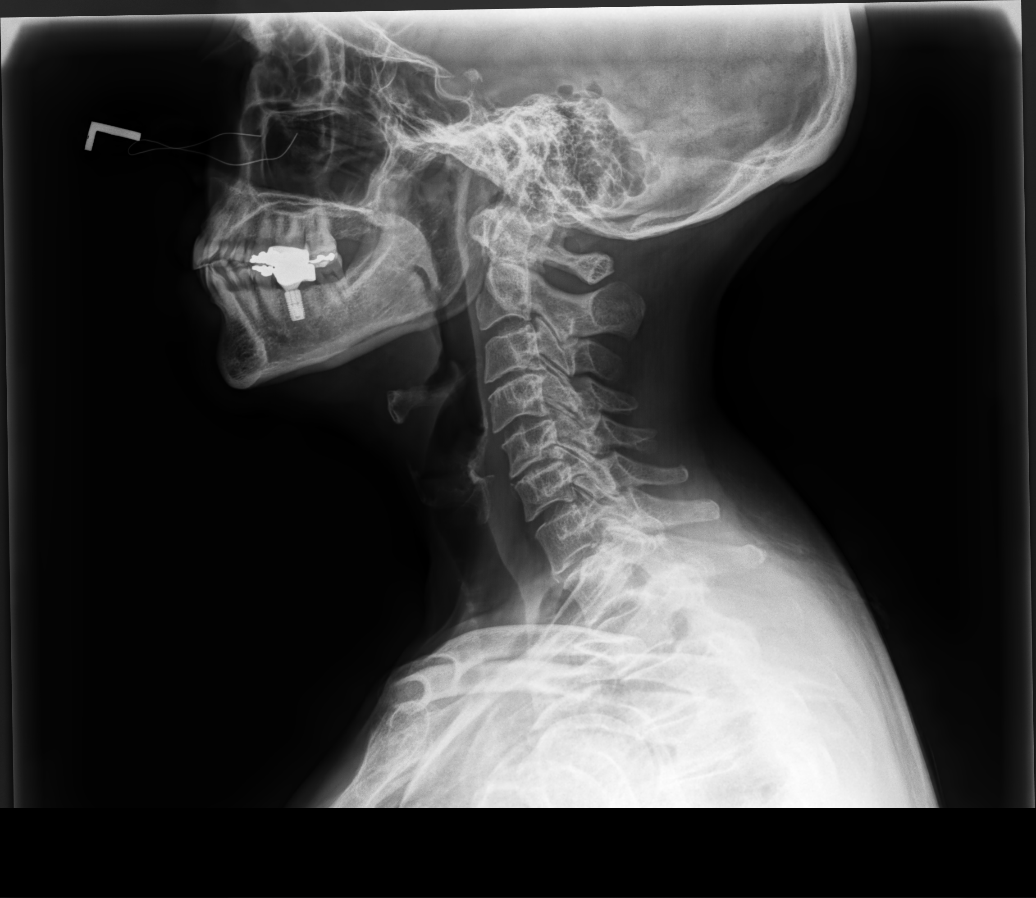

[cervical spine mlo (1 of 2)]
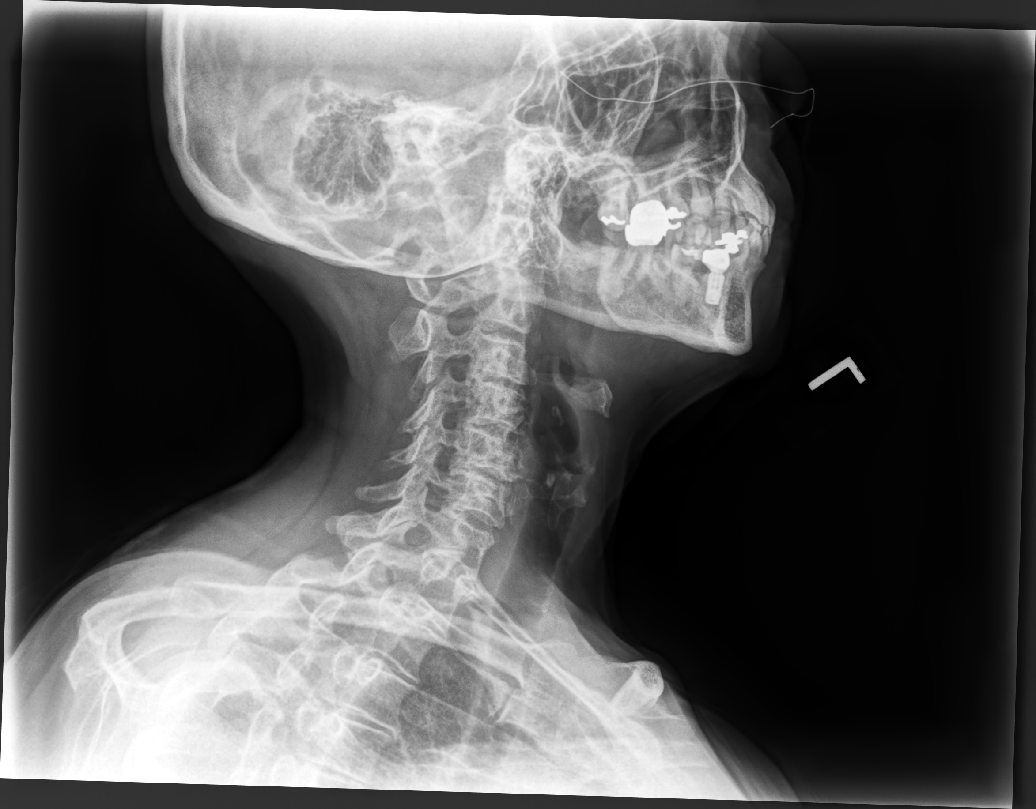

[cervical spine mlo (2 of 2)]
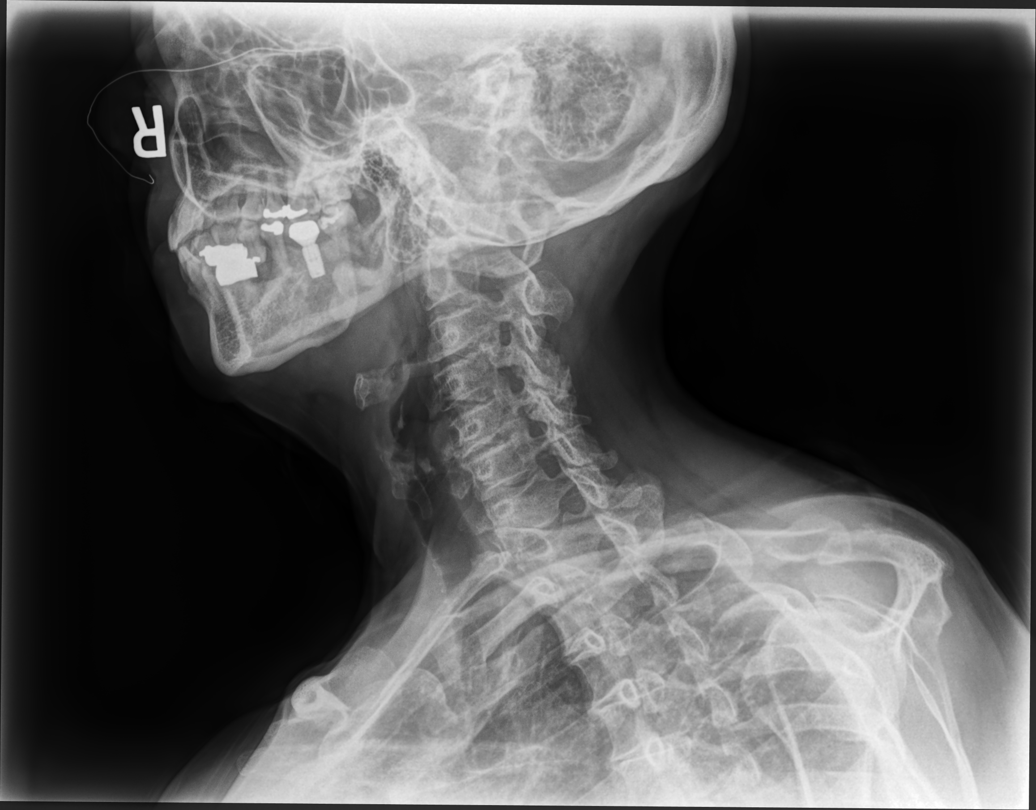

[cervical spine ap]
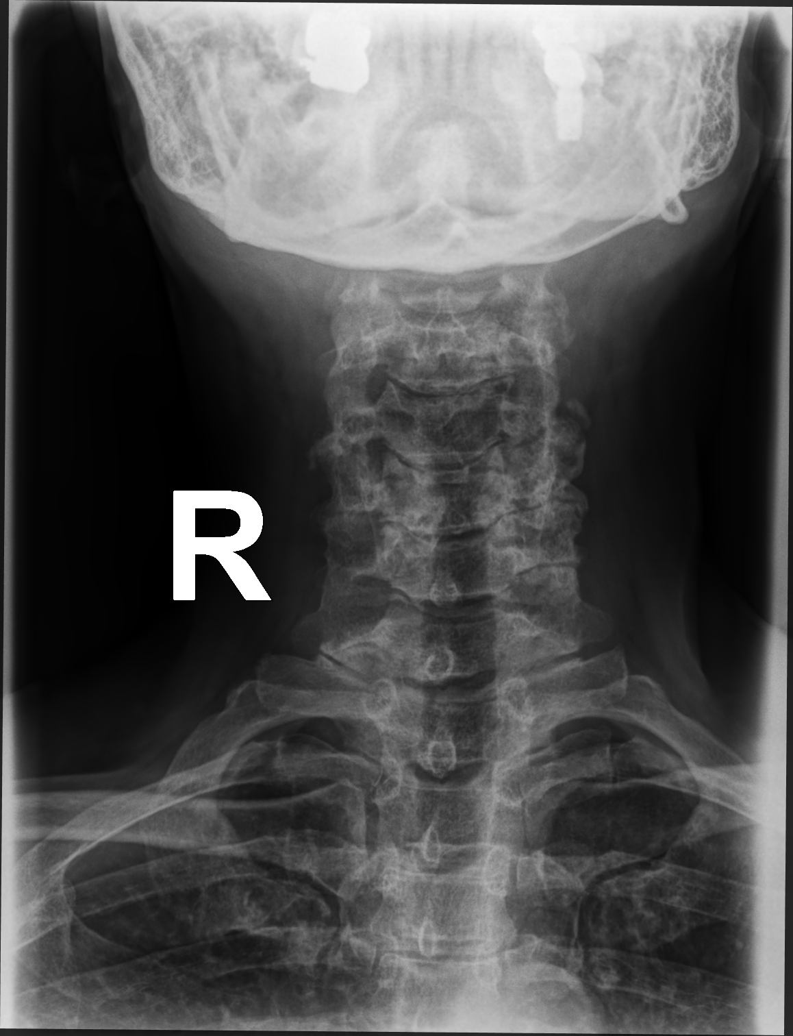

[cervical spine open mouth ap]
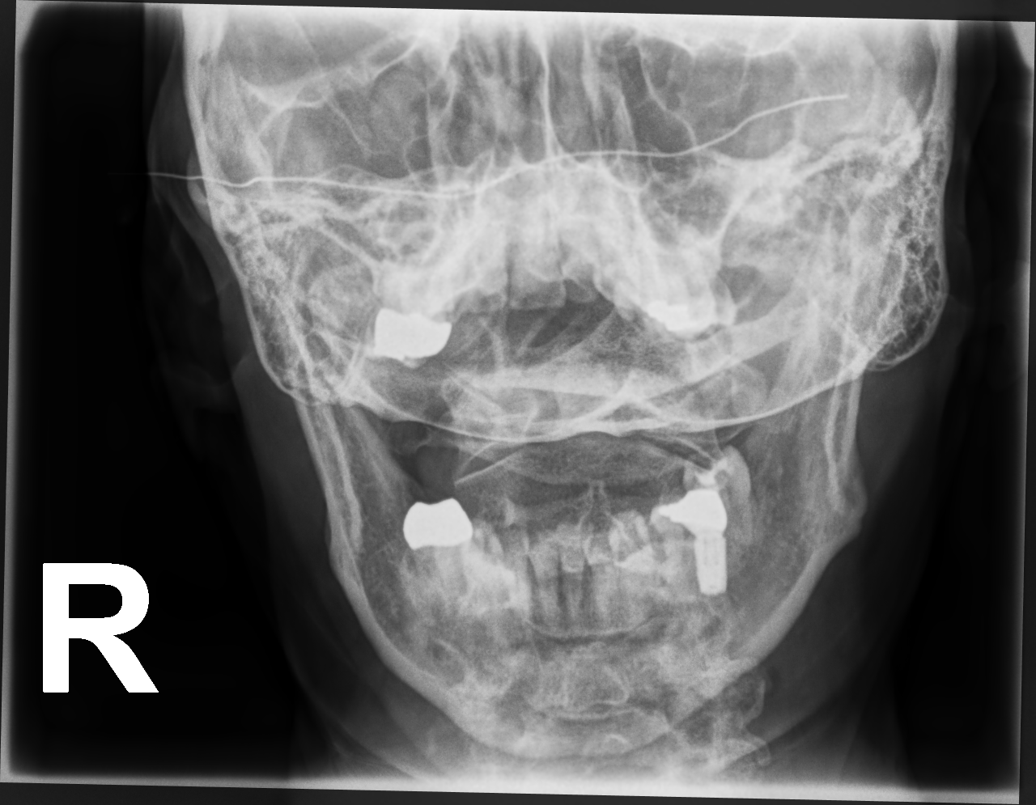

[5 of 5 positions shown; findings below may reference images not displayed]

FINDINGS: Frontal, lateral, open-mouth odontoid, and bilateral oblique views
were obtained. There is no appreciable fracture or
spondylolisthesis. Prevertebral soft tissues and predental space
regions are normal. There is moderate disc space narrowing at C3-4
and C5-6. There is moderate disc space narrowing at C7-T1. There is
facet hypertrophy with exit foraminal narrowing at C2-3 on the left,
at C3-4 bilaterally, at C4-5 bilaterally, at C5-6 bilaterally, and
at C6-7 on the left. No erosive change. Lung apices are clear.
IMPRESSION: Multilevel osteoarthritic change.  No fracture or spondylolisthesis.

## 2022-02-28 ENCOUNTER — Other Ambulatory Visit: Payer: Self-pay | Admitting: Family Medicine

## 2022-03-08 NOTE — Progress Notes (Signed)
[place] Phone: [phone] Subjective:   Veronica Veronica [name], am serving as a scribe for Dr. Hulan [name].  I'm seeing this patient by the request  of:  Veronica Koch, NP  CC: Neck pain follow-up  [id number]  Veronica Veronica [name] is a 66 y.o. female coming in with complaint of back and neck pain. OMT 02/05/2022. Also f/u for L wrist pain. Wrist injection last visit. Patient states that the injection helped the L wrist last visit. She is having surgery on her R wrist today.   Back and neck are tight.   Medications patient has been prescribed:   Taking:         Reviewed prior external information including notes and imaging from previsou exam, outside providers and external EMR if available.   As well as notes that were available from care everywhere and other healthcare systems.  Past medical history, social, surgical and family history all reviewed in electronic medical record.  Veronica [name]   Past Medical History:  Diagnosis Date   COVID-19 virus infection 04/26/2020   Depression    Dry eyes     Veronica [name]   Review of Systems:  Veronica [name], visual changes, nausea, vomiting, diarrhea, constipation, dizziness, abdominal pain, skin rash, fevers, chills, night sweats, weight loss, swollen lymph nodes, body aches, joint swelling, chest pain, shortness of breath, mood changes. POSITIVE muscle aches  Objective  Blood pressure 138/82, pulse 63, height '5\' 2"'$  (1.575 m), SpO2 99 %.   General: Veronica [name], dressed appropriately.  HEENT: Pupils equal, extraocular movements intact  Respiratory: Patient's speak in full sentences and does not appear short of breath  Cardiovascular: Veronica [name], non tender, Veronica [name]  Neck exam still has significant loss of lordosis.  Patient does  have some limited sidebending.  Negative Spurling's today though.  Patient does have tightness with paraspinal musculature in the low back as well similar.  Mild discomfort over the sacroiliac joint.  Osteopathic findings  C2 flexed rotated and side bent right C5 flexed rotated and side bent left T3 extended rotated and side bent right inhaled rib T8 extended rotated and side bent left L2 flexed rotated and side bent right Sacrum right on right       Assessment and Plan:  Degenerative disc disease, cervical Does respond relatively well to osteopathic manipulation and try that again today.  Patient is going to be having carpal tunnel surgery and we discussed the gabapentin again on a more regular basis.  Have been doing some breaks as well up to twice a day to help with more of the pain aspect.  Patient will follow-up with me again in 6 to 8 weeks.    Nonallopathic problems  Decision today to treat with OMT was based on Physical Exam  After verbal consent patient was treated with HVLA, ME, FPR techniques in cervical, rib, thoracic, lumbar, and sacral  areas  Patient tolerated the procedure well with improvement in symptoms  Patient given exercises, stretches and lifestyle modifications  See medications in patient instructions if given  Patient will follow up in 4-8 weeks     The above documentation has been reviewed and is accurate and complete Veronica Veronica [name]         Note: This dictation was prepared with Dragon dictation along with smaller phrase  technology. Any transcriptional errors that result from this process are unintentional.

## 2022-03-14 ENCOUNTER — Ambulatory Visit: Payer: 59 | Admitting: Family Medicine

## 2022-03-14 ENCOUNTER — Ambulatory Visit: Payer: Self-pay

## 2022-03-14 VITALS — BP 138/82 | HR 63 | Ht 62.0 in

## 2022-03-14 DIAGNOSIS — M25532 Pain in left wrist: Secondary | ICD-10-CM

## 2022-03-14 DIAGNOSIS — M9902 Segmental and somatic dysfunction of thoracic region: Secondary | ICD-10-CM

## 2022-03-14 DIAGNOSIS — M9903 Segmental and somatic dysfunction of lumbar region: Secondary | ICD-10-CM | POA: Diagnosis not present

## 2022-03-14 DIAGNOSIS — M5030 Other cervical disc degeneration, unspecified cervical region: Secondary | ICD-10-CM

## 2022-03-14 DIAGNOSIS — M9904 Segmental and somatic dysfunction of sacral region: Secondary | ICD-10-CM

## 2022-03-14 DIAGNOSIS — M9908 Segmental and somatic dysfunction of rib cage: Secondary | ICD-10-CM

## 2022-03-14 DIAGNOSIS — M9901 Segmental and somatic dysfunction of cervical region: Secondary | ICD-10-CM | POA: Diagnosis not present

## 2022-03-14 NOTE — Patient Instructions (Signed)
Great to see you Surgery will be great See me in 4-6 weeks

## 2022-03-14 NOTE — Assessment & Plan Note (Signed)
Does respond relatively well to osteopathic manipulation and try that again today.  Patient is going to be having carpal tunnel surgery and we discussed the gabapentin again on a more regular basis.  Have been doing some breaks as well up to twice a day to help with more of the pain aspect.  Patient will follow-up with me again in 6 to 8 weeks.

## 2022-03-22 ENCOUNTER — Other Ambulatory Visit: Payer: Self-pay | Admitting: Primary Care

## 2022-03-22 DIAGNOSIS — Z1231 Encounter for screening mammogram for malignant neoplasm of breast: Secondary | ICD-10-CM

## 2022-04-04 NOTE — Progress Notes (Signed)
  [name] [place] Phone: [phone] Subjective:   Veronica [name], am serving as a scribe for Dr. Hulan Saas.  I'm seeing this patient by the request  of:  Veronica Koch, NP  CC: Back and neck pain follow-up  [id number]  Veronica [name] is a 67 y.o. female coming in with complaint of back and neck pain. OMT 03/14/2022. Patient states routine OMT. Since the surgery has some strength loss but was only able to do one PT session then she had covid.   Medications patient has been prescribed: Gabapentin, Prednisone  Taking: Gabapentin          Reviewed prior external information including notes and imaging from previsou exam, outside providers and external EMR if available.   As well as notes that were available from care everywhere and other healthcare systems.  Past medical history, social, surgical and family history all reviewed in electronic medical record.  No pertanent information unless stated regarding to the chief complaint.   Past Medical History:  Diagnosis Date   COVID-19 virus infection 04/26/2020   Depression    Dry eyes     No Known Allergies   Review of Systems:  No headache, visual changes, nausea, vomiting, diarrhea, constipation, dizziness, abdominal pain, skin rash, fevers, chills, night sweats, weight loss, swollen lymph nodes, body aches, joint swelling, chest pain, shortness of breath, mood changes. POSITIVE muscle aches  Objective  Blood pressure 112/82, pulse 61, height '5\' 2"'$  (1.575 m), weight 130 lb (59 kg), SpO2 98 %.   General: No apparent distress alert and oriented x3 mood and affect normal, dressed appropriately.  HEENT: Pupils equal, extraocular movements intact  Respiratory: Patient's speak in full sentences and does not appear short of breath  Cardiovascular: No lower extremity edema, non tender, no erythema  Limited range of motion neck noted. Tightness noted right     Osteopathic findings  C2 flexed rotated and side bent right C6 flexed rotated and side bent left T3 extended rotated and side bent right inhaled rib T9 extended rotated and side bent left L2 flexed rotated and side bent right Sacrum right on right    Assessment and Plan:  Degenerative disc disease, cervical Degenerative disc disease noted.  Discussed icing regimen and home exercises.  Discussed core strengthening.  Increase activity slowly.  Follow-up again in 6 to 8 weeks.  Gabapentin and Celebrex discussed.    Nonallopathic problems  Decision today to treat with OMT was based on Physical Exam  After verbal consent patient was treated with HVLA, ME, FPR techniques in cervical, rib, thoracic, lumbar, and sacral  areas  Patient tolerated the procedure well with improvement in symptoms  Patient given exercises, stretches and lifestyle modifications  See medications in patient instructions if given  Patient will follow up in 4-8 weeks     The above documentation has been reviewed and is accurate and complete [name], DO         Note: This dictation was prepared with Dragon dictation along with smaller phrase technology. Any transcriptional errors that result from this process are unintentional.

## 2022-04-10 ENCOUNTER — Ambulatory Visit: Payer: 59 | Admitting: Family Medicine

## 2022-04-10 VITALS — BP 112/82 | HR 61 | Ht 62.0 in | Wt 130.0 lb

## 2022-04-10 DIAGNOSIS — M9903 Segmental and somatic dysfunction of lumbar region: Secondary | ICD-10-CM

## 2022-04-10 DIAGNOSIS — M9904 Segmental and somatic dysfunction of sacral region: Secondary | ICD-10-CM

## 2022-04-10 DIAGNOSIS — M9908 Segmental and somatic dysfunction of rib cage: Secondary | ICD-10-CM | POA: Diagnosis not present

## 2022-04-10 DIAGNOSIS — M5030 Other cervical disc degeneration, unspecified cervical region: Secondary | ICD-10-CM

## 2022-04-10 DIAGNOSIS — M9902 Segmental and somatic dysfunction of thoracic region: Secondary | ICD-10-CM

## 2022-04-10 DIAGNOSIS — M9901 Segmental and somatic dysfunction of cervical region: Secondary | ICD-10-CM

## 2022-04-10 NOTE — Assessment & Plan Note (Addendum)
Degenerative disc disease noted.  Discussed icing regimen and home exercises.  Discussed core strengthening.  Increase activity slowly.  Follow-up again in 6 to 8 weeks.  Gabapentin and Celebrex discussed.

## 2022-04-10 NOTE — Patient Instructions (Signed)
Good to see you Good luck in [place]  Follow up in 6-8 weeks

## 2022-04-29 ENCOUNTER — Other Ambulatory Visit: Payer: Self-pay | Admitting: Family Medicine

## 2022-05-07 ENCOUNTER — Ambulatory Visit
Admission: RE | Admit: 2022-05-07 | Discharge: 2022-05-07 | Disposition: A | Discharge status: Home / Self Care | Payer: 59 | Source: Ambulatory Visit | Attending: Primary Care | Admitting: Primary Care

## 2022-05-07 DIAGNOSIS — Z1231 Encounter for screening mammogram for malignant neoplasm of breast: Secondary | ICD-10-CM

## 2022-05-17 NOTE — Progress Notes (Unsigned)
  [facility] [place] [address] [place] Phone: [phone] Subjective:   [name], am serving as a scribe for Dr. Hulan Saas.  I'm seeing this patient by the request  of:  [name], NP  CC: Neck pain follow-up  [id number]  Shontal Hirn is a 67 y.o. female coming in with complaint of back and neck pain. OMT on 04/10/2022. Patient states worse since last visit. No other concerns.  Medications patient has been prescribed: Gabapentin  Taking:         Reviewed prior external information including notes and imaging from previsou exam, outside providers and external EMR if available.   As well as notes that were available from care everywhere and other healthcare systems.  Past medical history, social, surgical and family history all reviewed in electronic medical record.  No pertanent information unless stated regarding to the chief complaint.   Past Medical History:  Diagnosis Date   COVID-19 virus infection 04/26/2020   Depression    Dry eyes     No Known Allergies   Review of Systems:  No headache, visual changes, nausea, vomiting, diarrhea, constipation, dizziness, abdominal pain, skin rash, fevers, chills, night sweats, weight loss, swollen lymph nodes, body aches, joint swelling, chest pain, shortness of breath, mood changes. POSITIVE muscle aches  Objective  Blood pressure 138/84, pulse 63, height 5' 2"$  (1.575 m), weight 131 lb (59.4 kg), SpO2 97 %.   General: No apparent distress alert and oriented x3 mood and affect normal, dressed appropriately.  HEENT: Pupils equal, extraocular movements intact  Respiratory: Patient's speak in full sentences and does not appear short of breath  Cardiovascular: No lower extremity edema, non tender, no erythema  Neck exam does have some loss of lordosis.  Some tenderness to palpation in the paraspinal musculature.  Does have significant limitation of left-sided  sidebending.  Tightness noted on the left greater than right.  Also tightness in the left parascapular region.  Osteopathic findings  C6 flexed rotated and side bent left T4 extended rotated and side bent left inhaled rib T9 extended rotated and side bent left L1 flexed rotated and side bent right Sacrum right on right       Assessment and Plan:  Degenerative disc disease, cervical Significant arthritis noted.  Discussed with patient again Home exercises, could consider the possibility of epidural again.  We discussed icing regimen which activities to do and which ones to avoid, increase activity slowly.  Follow-up again in 6 to 8 weeks    Nonallopathic problems  Decision today to treat with OMT was based on Physical Exam  After verbal consent patient was treated with HVLA, ME, FPR techniques in cervical, rib, thoracic, lumbar, and sacral  areas  Patient tolerated the procedure well with improvement in symptoms  Patient given exercises, stretches and lifestyle modifications  See medications in patient instructions if given  Patient will follow up in 4-8 weeks     The above documentation has been reviewed and is accurate and complete [name], DO         Note: This dictation was prepared with Dragon dictation along with smaller phrase technology. Any transcriptional errors that result from this process are unintentional.

## 2022-05-22 ENCOUNTER — Ambulatory Visit: Payer: 59 | Admitting: Family Medicine

## 2022-05-22 VITALS — BP 138/84 | HR 63 | Ht 62.0 in | Wt 131.0 lb

## 2022-05-22 DIAGNOSIS — M5030 Other cervical disc degeneration, unspecified cervical region: Secondary | ICD-10-CM

## 2022-05-22 DIAGNOSIS — M9908 Segmental and somatic dysfunction of rib cage: Secondary | ICD-10-CM | POA: Diagnosis not present

## 2022-05-22 DIAGNOSIS — M9902 Segmental and somatic dysfunction of thoracic region: Secondary | ICD-10-CM | POA: Diagnosis not present

## 2022-05-22 DIAGNOSIS — M9904 Segmental and somatic dysfunction of sacral region: Secondary | ICD-10-CM | POA: Diagnosis not present

## 2022-05-22 DIAGNOSIS — M9903 Segmental and somatic dysfunction of lumbar region: Secondary | ICD-10-CM

## 2022-05-22 DIAGNOSIS — M9901 Segmental and somatic dysfunction of cervical region: Secondary | ICD-10-CM | POA: Diagnosis not present

## 2022-05-22 NOTE — Assessment & Plan Note (Signed)
Significant arthritis noted.  Discussed with patient again Home exercises, could consider the possibility of epidural again.  We discussed icing regimen which activities to do and which ones to avoid, increase activity slowly.  Follow-up again in 6 to 8 weeks

## 2022-05-22 NOTE — Patient Instructions (Signed)
Good to see you! Enjoy horsing around See you again in 4-6 weeks

## 2022-06-19 ENCOUNTER — Other Ambulatory Visit: Payer: Self-pay | Admitting: Primary Care

## 2022-06-19 DIAGNOSIS — M159 Polyosteoarthritis, unspecified: Secondary | ICD-10-CM

## 2022-06-21 ENCOUNTER — Other Ambulatory Visit: Payer: Self-pay | Admitting: Family Medicine

## 2022-07-16 ENCOUNTER — Encounter: Payer: Self-pay | Admitting: *Deleted

## 2022-07-16 NOTE — Progress Notes (Unsigned)
[facility] [address] [place] [address] Phone: [phone] Subjective:   INadine Counts, am serving as a scribe for Dr. Antoine Primas.  I'm seeing this patient by the request  of:  Doreene Nest, NP  CC: back and neck pain follow up   [id number]  Veronica [name] is a 67 y.o. female coming in with complaint of back and neck pain. OMT on 05/22/2022. Patient states L wrist pain.  Has noticed that the carpal tunnel on the left side has worsened recently. Describes it as a dull, throbbing aching pain that can affect daily activities.       Reviewed prior external information including notes and imaging from previsou exam, outside providers and external EMR if available.   As well as notes that were available from care everywhere and other healthcare systems.  Past medical history, social, surgical and family history all reviewed in electronic medical record.  No pertanent information unless stated regarding to the chief complaint.   Past Medical History:  Diagnosis Date   COVID-19 virus infection 04/26/2020   Depression    Dry eyes     No Known Allergies   Review of Systems:  No headache, visual changes, nausea, vomiting, diarrhea, constipation, dizziness, abdominal pain, skin rash, fevers, chills, night sweats, weight loss, swollen lymph nodes, body aches, joint swelling, chest pain, shortness of breath, mood changes. POSITIVE muscle aches  Objective  Blood pressure 108/76, pulse 69, height 5\' 2"  (1.575 m), weight 133 lb (60.3 kg), SpO2 98 %.   General: No apparent distress alert and oriented x3 mood and affect normal, dressed appropriately.  HEENT: Pupils equal, extraocular movements intact  Respiratory: Patient's speak in full sentences and does not appear short of breath  Cardiovascular: No lower extremity edema, non tender, no erythema  Neck exam does have some mild loss of lordosis.  Seems to have some tenderness to palpation in  the paraspinal musculature. Positive tinels on the left wrist noted at this time.  Osteopathic findings  C2 flexed rotated and side bent right C4 flexed rotated and side bent right C6 flexed rotated and side bent left T3 extended rotated and side bent right inhaled rib  Procedure: Real-time Ultrasound Guided Injection of left carpal tunnel Device: GE Logiq Q7 Ultrasound guided injection is preferred based studies that show increased duration, increased effect, greater accuracy, decreased procedural pain, increased response rate with ultrasound guided versus blind injection.  Verbal informed consent obtained.  Time-out conducted.  Noted no overlying erythema, induration, or other signs of local infection.  Skin prepped in a sterile fashion.  Local anesthesia: Topical Ethyl chloride.  With sterile technique and under real time ultrasound guidance:  median nerve visualized.  23g 5/8 inch needle inserted distal to proximal approach into nerve sheath. Pictures taken nfor needle placement. Patient did have injection of 0.5 cc of 0.5% Marcaine, and 0.5 cc of Kenalog 40 mg/dL. Completed without difficulty  Pain immediately resolved suggesting accurate placement of the medication.  Advised to call if fevers/chills, erythema, induration, drainage, or persistent bleeding.  Impression: Technically successful ultrasound guided injection.     Assessment and Plan:  Degenerative disc disease, cervical Chronic overall, still concerned that some of this could be radicular symptoms.  We will see how patient responds to the carpal tunnel injection.  Discussed home exercises and icing regimen as well as bracing at night for the wrist.  Follow-up again in 6 to 8 weeks  Left carpal tunnel syndrome  Repeat injection given again today.  Has been 5 months since the previous injection.  Discussed with patient that we would like to monitor.  If continuing to have increasing difficulty we will need to consider the  possibility of surgical intervention.  6 to 8 weeks f/u     Nonallopathic problems  Decision today to treat with OMT was based on Physical Exam  After verbal consent patient was treated with HVLA, ME, FPR techniques in cervical, rib, thoracic  areas  Patient tolerated the procedure well with improvement in symptoms  Patient given exercises, stretches and lifestyle modifications  See medications in patient instructions if given  Patient will follow up in 4-8 weeks    The above documentation has been reviewed and is accurate and complete Judi Saa, DO          Note: This dictation was prepared with Dragon dictation along with smaller phrase technology. Any transcriptional errors that result from this process are unintentional.

## 2022-07-17 ENCOUNTER — Other Ambulatory Visit: Payer: Self-pay

## 2022-07-17 ENCOUNTER — Ambulatory Visit: Payer: 59 | Admitting: Family Medicine

## 2022-07-17 ENCOUNTER — Encounter: Payer: Self-pay | Admitting: Family Medicine

## 2022-07-17 VITALS — BP 108/76 | HR 69 | Ht 62.0 in | Wt 133.0 lb

## 2022-07-17 DIAGNOSIS — M9901 Segmental and somatic dysfunction of cervical region: Secondary | ICD-10-CM

## 2022-07-17 DIAGNOSIS — M9902 Segmental and somatic dysfunction of thoracic region: Secondary | ICD-10-CM

## 2022-07-17 DIAGNOSIS — M25532 Pain in left wrist: Secondary | ICD-10-CM | POA: Diagnosis not present

## 2022-07-17 DIAGNOSIS — M9908 Segmental and somatic dysfunction of rib cage: Secondary | ICD-10-CM

## 2022-07-17 DIAGNOSIS — G5602 Carpal tunnel syndrome, left upper limb: Secondary | ICD-10-CM | POA: Diagnosis not present

## 2022-07-17 DIAGNOSIS — M5030 Other cervical disc degeneration, unspecified cervical region: Secondary | ICD-10-CM | POA: Diagnosis not present

## 2022-07-17 NOTE — Assessment & Plan Note (Signed)
Repeat injection given again today.  Has been 5 months since the previous injection.  Discussed with patient that we would like to monitor.  If continuing to have increasing difficulty we will need to consider the possibility of surgical intervention.  6 to 8 weeks f/u

## 2022-07-17 NOTE — Assessment & Plan Note (Signed)
Chronic overall, still concerned that some of this could be radicular symptoms.  We will see how patient responds to the carpal tunnel injection.  Discussed home exercises and icing regimen as well as bracing at night for the wrist.  Follow-up again in 6 to 8 weeks

## 2022-07-17 NOTE — Patient Instructions (Signed)
Good to see you! See you again in 6-8 weeks 

## 2022-08-20 ENCOUNTER — Other Ambulatory Visit: Payer: Self-pay | Admitting: Family Medicine

## 2022-08-23 NOTE — Progress Notes (Signed)
  [facility] [address] [place] [address] Phone: [phone] Subjective:   INadine Counts, am serving as a scribe for Dr. Antoine Primas.  I'm seeing this patient by the request  of:  Doreene Nest, NP  CC: back and pain f/u   [id number]  Veronica [name] is a 67 y.o. female coming in with complaint of back and neck pain. OMT on 07/17/2022. Also seen for wrist pain.  Last exam was given a carpal tunnel injection on the left wrist.  Patient states wrist doing well. No new concerns.  Medications patient has been prescribed: gabapentin  Taking:         Reviewed prior external information including notes and imaging from previsou exam, outside providers and external EMR if available.   As well as notes that were available from care everywhere and other healthcare systems.  Past medical history, social, surgical and family history all reviewed in electronic medical record.  No pertanent information unless stated regarding to the chief complaint.   Past Medical History:  Diagnosis Date   COVID-19 virus infection 04/26/2020   Depression    Dry eyes     No Known Allergies   Review of Systems:  No headache, visual changes, nausea, vomiting, diarrhea, constipation, dizziness, abdominal pain, skin rash, fevers, chills, night sweats, weight loss, swollen lymph nodes, body aches, joint swelling, chest pain, shortness of breath, mood changes. POSITIVE muscle aches  Objective  Blood pressure 122/76, pulse 72, height 5\' 2"  (1.575 m), weight 135 lb (61.2 kg), SpO2 98 %.   General: No apparent distress alert and oriented x3 mood and affect normal, dressed appropriately.  HEENT: Pupils equal, extraocular movements intact  Respiratory: Patient's speak in full sentences and does not appear short of breath  Cardiovascular: No lower extremity edema, non tender, no erythema  Neck exam does have significant loss of lordosis.  Limited sidebending  bilaterally.  Patient does have some limited rotation as well.  Negative Spurling's are noted today.  Osteopathic findings  C2 flexed rotated and side bent right C6 flexed rotated and side bent left T3 extended rotated and side bent right inhaled rib T9 extended rotated and side bent left    Assessment and Plan:  Degenerative disc disease, cervical Degenerative disc noted.  Discussed icing regimen and home exercises, discussed which activities to do and which ones to avoid.  Increase activity slowly over the next several weeks.  He states home exercises and follow-up in 6 to 8 weeks.    Nonallopathic problems  Decision today to treat with OMT was based on Physical Exam  After verbal consent patient was treated with HVLA, ME, FPR techniques in cervical, rib, thoracic areas  Patient tolerated the procedure well with improvement in symptoms  Patient given exercises, stretches and lifestyle modifications  See medications in patient instructions if given  Patient will follow up in 6-8 weeks    The above documentation has been reviewed and is accurate and complete Judi Saa, DO          Note: This dictation was prepared with Dragon dictation along with smaller phrase technology. Any transcriptional errors that result from this process are unintentional.

## 2022-08-24 ENCOUNTER — Encounter: Payer: Self-pay | Admitting: Internal Medicine

## 2022-08-28 ENCOUNTER — Ambulatory Visit: Payer: 59 | Admitting: Family Medicine

## 2022-08-28 VITALS — BP 122/76 | HR 72 | Ht 62.0 in | Wt 135.0 lb

## 2022-08-28 DIAGNOSIS — M9901 Segmental and somatic dysfunction of cervical region: Secondary | ICD-10-CM | POA: Diagnosis not present

## 2022-08-28 DIAGNOSIS — M9903 Segmental and somatic dysfunction of lumbar region: Secondary | ICD-10-CM | POA: Diagnosis not present

## 2022-08-28 DIAGNOSIS — M9908 Segmental and somatic dysfunction of rib cage: Secondary | ICD-10-CM

## 2022-08-28 DIAGNOSIS — M5030 Other cervical disc degeneration, unspecified cervical region: Secondary | ICD-10-CM | POA: Diagnosis not present

## 2022-08-28 DIAGNOSIS — M9904 Segmental and somatic dysfunction of sacral region: Secondary | ICD-10-CM

## 2022-08-28 DIAGNOSIS — M9902 Segmental and somatic dysfunction of thoracic region: Secondary | ICD-10-CM

## 2022-08-28 NOTE — Patient Instructions (Signed)
Good to see you! Go kick butt in [place] See you again in 6-8 weeks

## 2022-08-28 NOTE — Assessment & Plan Note (Signed)
Degenerative disc noted.  Discussed icing regimen and home exercises, discussed which activities to do and which ones to avoid.  Increase activity slowly over the next several weeks.  He states home exercises and follow-up in 6 to 8 weeks.

## 2022-09-04 ENCOUNTER — Other Ambulatory Visit: Payer: Self-pay | Admitting: Primary Care

## 2022-09-04 DIAGNOSIS — F419 Anxiety disorder, unspecified: Secondary | ICD-10-CM

## 2022-09-04 DIAGNOSIS — M159 Polyosteoarthritis, unspecified: Secondary | ICD-10-CM

## 2022-09-04 NOTE — Telephone Encounter (Signed)
Patient is due for CPE/follow up in August, this will be required prior to any further refills.  Please schedule, thank you!

## 2022-09-05 DIAGNOSIS — F32A Depression, unspecified: Secondary | ICD-10-CM

## 2022-09-05 NOTE — Telephone Encounter (Signed)
Patient scheduled.

## 2022-09-17 MED ORDER — BUPROPION HCL ER (XL) 150 MG PO TB24: ORAL_TABLET | ORAL | 0 refills | Status: DC

## 2022-10-16 ENCOUNTER — Ambulatory Visit: Payer: 59 | Admitting: Family Medicine

## 2022-10-18 NOTE — Progress Notes (Signed)
  [facility] [address] [place] [address] Phone: [phone] Subjective:   Bruce Donath, am serving as a scribe for Dr. Antoine Primas.  I'm seeing this patient by the request  of:  Doreene Nest, NP  CC: Back and neck pain follow-up  [id number]  Veronica [name] is a 67 y.o. female coming in with complaint of back and neck pain. OMT on 08/28/2022. Patient states that she is having a lower back issue. She was unable to walk earlier this week. Saw massage therapist and her pain has improved. She was doing some exercises in the pool   Medications patient has been prescribed: Gabapentin, Celebrex  Taking: Intermittently         Reviewed prior external information including notes and imaging from previsou exam, outside providers and external EMR if available.   As well as notes that were available from care everywhere and other healthcare systems.  Past medical history, social, surgical and family history all reviewed in electronic medical record.  No pertanent information unless stated regarding to the chief complaint.   Past Medical History:  Diagnosis Date   COVID-19 virus infection 04/26/2020   Depression    Dry eyes     No Known Allergies   Review of Systems:  No headache, visual changes, nausea, vomiting, diarrhea, constipation, dizziness, abdominal pain, skin rash, fevers, chills, night sweats, weight loss, swollen lymph nodes, body aches, joint swelling, chest pain, shortness of breath, mood changes. POSITIVE muscle aches  Objective  Blood pressure 116/82, pulse 73, height 5\' 2"  (1.575 m), weight 135 lb (61.2 kg), SpO2 98%.   General: No apparent distress alert and oriented x3 mood and affect normal, dressed appropriately.  HEENT: Pupils equal, extraocular movements intact  Respiratory: Patient's speak in full sentences and does not appear short of breath  Cardiovascular: No lower extremity edema, non tender, no erythema   Back and neck exam does have some tenderness noted.  Neck exam does have some limited range of motion.  Negative Spurling's.  Osteopathic findings  C6 flexed rotated and side bent left C7 flexed rotated and side bent left T2 extended rotated and side bent right inhaled rib T9 extended rotated and side bent left L3 flexed rotated and side bent right Sacrum right on right       Assessment and Plan:  Degenerative disc disease, cervical Continue degenerative disc disease.  Discussed posture and ergonomics, discussed which activities to do and which ones to avoid.  Patient is unable to be significantly active and is still riding horses.  Has gabapentin and it does seem to be helpful.  Follow-up again in 6 to 8 weeks    Nonallopathic problems  Decision today to treat with OMT was based on Physical Exam  After verbal consent patient was treated with HVLA, ME, FPR techniques in cervical, rib, thoracic, lumbar, and sacral  areas  Patient tolerated the procedure well with improvement in symptoms  Patient given exercises, stretches and lifestyle modifications  See medications in patient instructions if given  Patient will follow up in 4-8 weeks    The above documentation has been reviewed and is accurate and complete Judi Saa, DO          Note: This dictation was prepared with Dragon dictation along with smaller phrase technology. Any transcriptional errors that result from this process are unintentional.

## 2022-10-26 ENCOUNTER — Ambulatory Visit: Payer: PPO | Admitting: Family Medicine

## 2022-10-26 ENCOUNTER — Encounter: Payer: Self-pay | Admitting: Family Medicine

## 2022-10-26 VITALS — BP 116/82 | HR 73 | Ht 62.0 in | Wt 135.0 lb

## 2022-10-26 DIAGNOSIS — M9903 Segmental and somatic dysfunction of lumbar region: Secondary | ICD-10-CM | POA: Diagnosis not present

## 2022-10-26 DIAGNOSIS — M9901 Segmental and somatic dysfunction of cervical region: Secondary | ICD-10-CM

## 2022-10-26 DIAGNOSIS — M9908 Segmental and somatic dysfunction of rib cage: Secondary | ICD-10-CM | POA: Diagnosis not present

## 2022-10-26 DIAGNOSIS — M9904 Segmental and somatic dysfunction of sacral region: Secondary | ICD-10-CM

## 2022-10-26 DIAGNOSIS — M9902 Segmental and somatic dysfunction of thoracic region: Secondary | ICD-10-CM

## 2022-10-26 DIAGNOSIS — M5030 Other cervical disc degeneration, unspecified cervical region: Secondary | ICD-10-CM

## 2022-10-26 NOTE — Assessment & Plan Note (Signed)
Continue degenerative disc disease.  Discussed posture and ergonomics, discussed which activities to do and which ones to avoid.  Patient is unable to be significantly active and is still riding horses.  Has gabapentin and it does seem to be helpful.  Follow-up again in 6 to 8 weeks

## 2022-10-26 NOTE — Patient Instructions (Signed)
3 IBU 3x a day for 3 days Take horse medicine at night See me in 6-8 weeks

## 2022-10-31 ENCOUNTER — Encounter (INDEPENDENT_AMBULATORY_CARE_PROVIDER_SITE_OTHER): Payer: Self-pay

## 2022-11-20 ENCOUNTER — Other Ambulatory Visit: Payer: Self-pay

## 2022-11-20 ENCOUNTER — Encounter: Payer: Self-pay | Admitting: Primary Care

## 2022-11-20 ENCOUNTER — Ambulatory Visit (INDEPENDENT_AMBULATORY_CARE_PROVIDER_SITE_OTHER): Payer: PPO | Admitting: Primary Care

## 2022-11-20 VITALS — BP 110/60 | HR 55 | Temp 98.1°F | Ht 62.5 in | Wt 135.0 lb

## 2022-11-20 DIAGNOSIS — E2839 Other primary ovarian failure: Secondary | ICD-10-CM

## 2022-11-20 DIAGNOSIS — M549 Dorsalgia, unspecified: Secondary | ICD-10-CM

## 2022-11-20 DIAGNOSIS — F419 Anxiety disorder, unspecified: Secondary | ICD-10-CM | POA: Diagnosis not present

## 2022-11-20 DIAGNOSIS — G8929 Other chronic pain: Secondary | ICD-10-CM | POA: Diagnosis not present

## 2022-11-20 DIAGNOSIS — G43701 Chronic migraine without aura, not intractable, with status migrainosus: Secondary | ICD-10-CM | POA: Diagnosis not present

## 2022-11-20 DIAGNOSIS — G5601 Carpal tunnel syndrome, right upper limb: Secondary | ICD-10-CM

## 2022-11-20 DIAGNOSIS — M159 Polyosteoarthritis, unspecified: Secondary | ICD-10-CM | POA: Diagnosis not present

## 2022-11-20 DIAGNOSIS — Z0000 Encounter for general adult medical examination without abnormal findings: Secondary | ICD-10-CM | POA: Diagnosis not present

## 2022-11-20 DIAGNOSIS — F32A Depression, unspecified: Secondary | ICD-10-CM

## 2022-11-20 DIAGNOSIS — G5602 Carpal tunnel syndrome, left upper limb: Secondary | ICD-10-CM

## 2022-11-20 DIAGNOSIS — M542 Cervicalgia: Secondary | ICD-10-CM

## 2022-11-20 LAB — COMPREHENSIVE METABOLIC PANEL
ALT: 12 U/L (ref 0–35)
AST: 21 U/L (ref 0–37)
Albumin: 4 g/dL (ref 3.5–5.2)
Alkaline Phosphatase: 52 U/L (ref 39–117)
BUN: 16 mg/dL (ref 6–23)
CO2: 26 mEq/L (ref 19–32)
Calcium: 9.1 mg/dL (ref 8.4–10.5)
Chloride: 104 mEq/L (ref 96–112)
Creatinine, Ser: 0.65 mg/dL (ref 0.40–1.20)
GFR: 92.58 mL/min (ref >60.00)
Glucose, Bld: 82 mg/dL (ref 70–99)
Potassium: 4.1 mEq/L (ref 3.5–5.1)
Sodium: 137 mEq/L (ref 135–145)
Total Bilirubin: 0.9 mg/dL (ref 0.2–1.2)
Total Protein: 6.7 g/dL (ref 6.0–8.3)

## 2022-11-20 LAB — CBC
HCT: 40.5 % (ref 36.0–46.0)
Hemoglobin: 13.2 g/dL (ref 12.0–15.0)
MCHC: 32.5 g/dL (ref 30.0–36.0)
MCV: 94.5 fl (ref 78.0–100.0)
Platelets: 393 10*3/uL (ref 150.0–400.0)
RBC: 4.28 Mil/uL (ref 3.87–5.11)
RDW: 13 % (ref 11.5–15.5)
WBC: 5.9 10*3/uL (ref 4.0–10.5)

## 2022-11-20 LAB — LIPID PANEL
Cholesterol: 210 mg/dL — ABNORMAL HIGH (ref 0–200)
HDL: 70.1 mg/dL (ref >39.00)
LDL Cholesterol: 122 mg/dL — ABNORMAL HIGH (ref 0–99)
NonHDL: 139.54
Total CHOL/HDL Ratio: 3
Triglycerides: 90 mg/dL (ref 0.0–149.0)
VLDL: 18 mg/dL (ref 0.0–40.0)

## 2022-11-20 MED ORDER — BUPROPION HCL ER (XL) 150 MG PO TB24
150.0000 mg | ORAL_TABLET | Freq: Every day | ORAL | 3 refills | Status: DC
  Filled 2022-11-20: qty 90, 90d supply, fill #0
  Filled 2023-02-14: qty 90, 90d supply, fill #1
  Filled 2023-05-16: qty 90, 90d supply, fill #2
  Filled 2023-08-14: qty 90, 90d supply, fill #3

## 2022-11-20 NOTE — Assessment & Plan Note (Signed)
Following with sports medicine, reviewed office notes from July 2024. Continue Celebrex 100 mg BID PRN.

## 2022-11-20 NOTE — Progress Notes (Signed)
Subjective:    Veronica [name] is a 67 y.o. female who presents for a Welcome to Medicare exam.   Review of Systems  Constitutional:  Negative for weight loss.  HENT:  Negative for congestion.   Eyes:  Negative for blurred vision.  Respiratory:  Negative for shortness of breath.   Cardiovascular:  Negative for chest pain.  Gastrointestinal:  Negative for constipation and diarrhea.  Genitourinary:  Negative for frequency.  Musculoskeletal:  Positive for neck pain.  Skin:  Negative for rash.  Neurological:  Negative for headaches.  Psychiatric/Behavioral:  Negative for depression.            Immunizations: -Tetanus: Completed in 2019 -Shingles: Never completed, declines  -Pneumonia: Never completed, declines   Mammogram: Completed in February 2024  Colonoscopy: Completed in 2014, due now. She is aware will call to schedule.  Dexa: completed years ago.  Due now.     Objective:    Today's Vitals   11/20/22 0813  BP: 110/60  Pulse: (!) 55  Temp: 98.1 F (36.7 C)  TempSrc: Temporal  SpO2: 99%  Weight: 135 lb (61.2 kg)  Height: 5' 2.5" (1.588 m)  Body mass index is 24.3 kg/m.  Medications Outpatient Encounter Medications as of 11/20/2022  Medication Sig   buPROPion (WELLBUTRIN XL) 150 MG 24 hr tablet TAKE 1 TABLET DAILY FOR    ANXIETY AND DEPRESSION   celecoxib (CELEBREX) 100 MG capsule TAKE 1 CAPSULE TWICE DAILY AS NEEDED FOR PAIN   Diclofenac Sodium (PENNSAID) 2 % SOLN Place 1 application onto the skin 2 (two) times daily.   gabapentin (NEURONTIN) 100 MG capsule TAKE 3 CAPSULES AT BEDTIME   SUMAtriptan-naproxen (TREXIMET) 85-500 MG tablet Take 1 tablet by mouth at migraine onset. May repeat in 2 hours if migraine persists, do not exceed 2 tablets in 24 hours.   triamcinolone cream (KENALOG) 0.1 % Apply topically as needed.   No facility-administered encounter medications on file as of 11/20/2022.     History: Past Medical History:  Diagnosis Date   COVID-19  virus infection 04/26/2020   Depression    Dry eyes    Past Surgical History:  Procedure Laterality Date   CLOSED REDUCTION HAND FRACTURE  04/02/2000   left   EYE SURGERY  2000   Lasix   FRACTURE SURGERY     Hand   LASIK      Family History  Problem Relation Age of Onset   Hypertension Mother    Heart disease Mother    COPD Father    Stroke Father    Colon cancer Neg Hx    Social History   Occupational History   Not on file  Tobacco Use   Smoking status: Former    Current packs/day: 0.00    Average packs/day: 0.5 packs/day for 4.0 years (2.0 ttl pk-yrs)    Types: Cigarettes    Quit date: 06/11/1988    Years since quitting: 34.4   Smokeless tobacco: Never  Substance and Sexual Activity   Alcohol use: Yes    Alcohol/week: 6.0 standard drinks of alcohol    Types: 6 Glasses of wine per week   Drug use: No   Sexual activity: Yes    Birth control/protection: None    Tobacco Counseling Counseling given: Not Answered   Immunizations and Health Maintenance Immunization History  Administered Date(s) Administered   Influenza Split 01/16/2012   Influenza Whole 12/15/2008, 01/12/2010   Influenza,inj,Quad PF,6+ Mos 12/26/2012, 12/04/2013, 12/24/2014, 12/23/2015, 12/18/2016, 01/30/2018, 01/06/2019, 01/13/2020,  02/10/2021, 01/02/2022   PFIZER(Purple Top)SARS-COV-2 Vaccination 06/16/2019, 07/07/2019   Td 08/15/2006, 02/19/2007, 06/20/2017   Health Maintenance Due  Topic Date Due   Zoster Vaccines- Shingrix (1 of 2) Never done   COVID-19 Vaccine (3 - 2023-24 season) 12/01/2021   Colonoscopy  07/03/2022   PAP SMEAR-Modifier  08/19/2022   Pneumonia Vaccine 89+ Years old (1 of 1 - PCV) Never done   DEXA SCAN  Never done   INFLUENZA VACCINE  11/01/2022    Activities of Daily Living    11/20/2022    8:30 AM  In your present state of health, do you have any difficulty performing the following activities:  Hearing? 0  Vision? 0  Difficulty concentrating or making  decisions? 0  Walking or climbing stairs? 0  Dressing or bathing? 0  Doing errands, shopping? 0    Physical Exam HENT:     Right Ear: Tympanic membrane and ear canal normal.     Left Ear: Tympanic membrane and ear canal normal.     Nose: Nose normal.  Eyes:     Conjunctiva/sclera: Conjunctivae normal.     Pupils: Pupils are equal, round, and reactive to light.  Neck:     Thyroid: No thyromegaly.  Cardiovascular:     Rate and Rhythm: Normal rate and regular rhythm.     Heart sounds: No murmur heard. Pulmonary:     Effort: Pulmonary effort is normal.     Breath sounds: Normal breath sounds. No rales.  Abdominal:     General: Bowel sounds are normal.     Palpations: Abdomen is soft.     Tenderness: There is no abdominal tenderness.  Musculoskeletal:        General: Normal range of motion.     Cervical back: Neck supple.  Lymphadenopathy:     Cervical: No cervical adenopathy.  Skin:    General: Skin is warm and dry.     Findings: No rash.  Neurological:     Mental Status: She is alert and oriented to person, place, and time.     Cranial Nerves: No cranial nerve deficit.     Deep Tendon Reflexes: Reflexes are normal and symmetric.     Advanced Directives: Never completed        ECG: Sinus bradycardia with rate of 56.  No PAC/PVCs.  T wave inversion noted on V2.  No old EKG to compare.  Asymptomatic. Assessment:    This is a routine wellness examination for this patient .   Vision/Hearing screen Hearing Screening   500Hz  1000Hz  2000Hz  4000Hz   Right ear 20 20 20 20   Left ear 20 20 20 20    Vision Screening   Right eye Left eye Both eyes  Without correction 20/20 20/20 20/40   With correction       Dietary issues and exercise activities discussed:      Goals   None    Depression Screen    11/20/2022    8:30 AM 11/07/2021    9:37 AM 10/28/2020   10:14 AM 10/31/2016    9:10 AM  PHQ 2/9 Scores  PHQ - 2 Score 0 0 0 0  PHQ- 9 Score 1 0       Fall Risk     11/20/2022    8:35 AM  Fall Risk   Falls in the past year? 0  Number falls in past yr: 0  Injury with Fall? 0  Risk for fall due to : No Fall Risks  Follow up  Falls evaluation completed    Cognitive Function: See mini cog results.        Patient Care Team: Doreene Nest, NP as PCP - General (Internal Medicine)     Plan:   See problem based charting  I have personally reviewed and noted the following in the patient's chart:   Medical and social history Use of alcohol, tobacco or illicit drugs  Current medications and supplements Functional ability and status Nutritional status Physical activity Advanced directives List of other physicians Hospitalizations, surgeries, and ER visits in previous 12 months Vitals Screenings to include cognitive, depression, and falls Referrals and appointments  In addition, I have reviewed and discussed with patient certain preventive protocols, quality metrics, and best practice recommendations. A written personalized care plan for preventive services as well as general preventive health recommendations were provided to patient.     Doreene Nest, NP 11/20/2022

## 2022-11-20 NOTE — Assessment & Plan Note (Signed)
Due for Prevnar 20, she declines today.  Also due for Shingrix, discussed to complete this at the pharmacy. Mammogram up-to-date. Bone density scan due, orders placed Colonoscopy due, she is aware and will call GI to schedule  Discussed the importance of a healthy diet and regular exercise in order for weight loss, and to reduce the risk of further co-morbidity.  Exam stable. Labs pending.  Follow up in 1 year for repeat physical.

## 2022-11-20 NOTE — Assessment & Plan Note (Signed)
Controlled.  Continue bupropion XL 150 mg daily.  

## 2022-11-20 NOTE — Addendum Note (Signed)
Addended by: Doreene Nest on: 11/20/2022 08:47 AM   Modules accepted: Orders

## 2022-11-20 NOTE — Assessment & Plan Note (Signed)
Improving.  Continue gabapentin 100-300 mg HS PRN Following with hand specialist.

## 2022-11-20 NOTE — Patient Instructions (Signed)
Stop by the lab prior to leaving today. I will notify you of your results once received.   [facility] to [facility] your [facility].   Call GI to schedule your colonoscopy.  It was a pleasure to see you today!

## 2022-11-20 NOTE — Assessment & Plan Note (Signed)
Stable. Following with sports medicine, office notes reviewed from July 2024.  Continue Celebrex 100 mg twice daily as needed.

## 2022-11-20 NOTE — Assessment & Plan Note (Signed)
Controlled. No recent symptoms.  Continue sumatriptan-naproxen 85-500 mg PRN.

## 2022-11-20 NOTE — Assessment & Plan Note (Signed)
Stable.  Continue gabapentin 100-300 mg HS PRN

## 2022-11-21 ENCOUNTER — Encounter: Payer: Self-pay | Admitting: Internal Medicine

## 2022-12-06 NOTE — Progress Notes (Deleted)
  Veronica [name] [address] [place] [address] Phone: [phone] Subjective:    I'm seeing this patient by the request  of:  Doreene Nest, NP  CC: back and neck exam   ION:[id number]  Veronica [name] is a 67 y.o. female coming in with complaint of back and neck pain. OMT on 10/26/2022. Patient states   Medications patient has been prescribed:   Taking:         Reviewed prior external information including notes and imaging from previsou exam, outside providers and external EMR if available.   As well as notes that were available from care everywhere and other healthcare systems.  Past medical history, social, surgical and family history all reviewed in electronic medical record.  No pertanent information unless stated regarding to the chief complaint.   Past Medical History:  Diagnosis Date   COVID-19 virus infection 04/26/2020   Depression    Dry eyes     No Known Allergies   Review of Systems:  No headache, visual changes, nausea, vomiting, diarrhea, constipation, dizziness, abdominal pain, skin rash, fevers, chills, night sweats, weight loss, swollen lymph nodes, body aches, joint swelling, chest pain, shortness of breath, mood changes. POSITIVE muscle aches  Objective  There were no vitals taken for this visit.   General: No apparent distress alert and oriented x3 mood and affect normal, dressed appropriately.  HEENT: Pupils equal, extraocular movements intact  Respiratory: Patient's speak in full sentences and does not appear short of breath  Cardiovascular: No lower extremity edema, non tender, no erythema  Gait normal  MSK:  Back  Neck exam shows  Wrist exam shows   Osteopathic findings  C2 flexed rotated and side bent right C6 flexed rotated and side bent left C7 F RS right  T3 extended rotated and side bent right inhaled rib T9 extended rotated and side bent left L2 flexed rotated and side bent right Sacrum  right on right    Assessment and Plan:  No problem-specific Assessment & Plan notes found for this encounter.    Nonallopathic problems  Decision today to treat with OMT was based on Physical Exam  After verbal consent patient was treated with HVLA, ME, FPR techniques in cervical, rib, thoracic, lumbar, and sacral  areas  Patient tolerated the procedure well with improvement in symptoms  Patient given exercises, stretches and lifestyle modifications  See medications in patient instructions if given  Patient will follow up in 4-8 weeks     The above documentation has been reviewed and is accurate and complete Judi Saa, DO         Note: This dictation was prepared with Dragon dictation along with smaller phrase technology. Any transcriptional errors that result from this process are unintentional.

## 2022-12-12 ENCOUNTER — Ambulatory Visit: Payer: PPO | Admitting: Family Medicine

## 2022-12-12 DIAGNOSIS — M65311 Trigger thumb, right thumb: Secondary | ICD-10-CM | POA: Diagnosis not present

## 2022-12-25 NOTE — Progress Notes (Unsigned)
  [facility] [address] [place] [address] Phone: [phone] Subjective:   INadine Counts, am serving as a scribe for Dr. Antoine Primas.  I'm seeing this patient by the request  of:  Doreene Nest, NP  CC: Back and neck pain follow-up  [id number]  Veronica [name] is a 67 y.o. female coming in with complaint of back and neck pain. OMT on 10/26/2022 Patient states same per usual. No new concerns.  Medications patient has been prescribed: Gabapentin  Taking:         Reviewed prior external information including notes and imaging from previsou exam, outside providers and external EMR if available.   As well as notes that were available from care everywhere and other healthcare systems.  Past medical history, social, surgical and family history all reviewed in electronic medical record.  No pertanent information unless stated regarding to the chief complaint.   Past Medical History:  Diagnosis Date   COVID-19 virus infection 04/26/2020   Depression    Dry eyes     No Known Allergies   Review of Systems:  No headache, visual changes, nausea, vomiting, diarrhea, constipation, dizziness, abdominal pain, skin rash, fevers, chills, night sweats, weight loss, swollen lymph nodes, body aches, joint swelling, chest pain, shortness of breath, mood changes. POSITIVE muscle aches  Objective  Blood pressure 122/84, pulse 78, height 5\' 2"  (1.575 m), weight 138 lb (62.6 kg), SpO2 98%.   General: No apparent distress alert and oriented x3 mood and affect normal, dressed appropriately.  HEENT: Pupils equal, extraocular movements intact  Respiratory: Patient's speak in full sentences and does not appear short of breath  Cardiovascular: No lower extremity edema, non tender, no erythema  Gait MSK:  Does have some loss lordosis.  Patient does have some limited range of motion of the neck.  Patient does have less than 15 degrees of extension of the  neck.  Osteopathic findings  C3 flexed rotated and side bent right C6 flexed rotated and side bent left T3 extended rotated and side bent right inhaled rib T9 extended rotated and side bent left L2 flexed rotated and side bent right Sacrum right on right       Assessment and Plan:  Degenerative disc disease, cervical Continued difficulty.  Does have tightness noted in the paraspinal musculature, does have muscle tightness noted of the neck area.  Will continue to monitor.  Did have some difficulty with her right hip today that we will also monitor.  Follow-up again in 6 to 8 weeks.    Nonallopathic problems  Decision today to treat with OMT was based on Physical Exam  After verbal consent patient was treated with HVLA, ME, FPR techniques in cervical, rib, thoracic, lumbar, and sacral  areas  Patient tolerated the procedure well with improvement in symptoms  Patient given exercises, stretches and lifestyle modifications  See medications in patient instructions if given  Patient will follow up in 4-8 weeks    The above documentation has been reviewed and is accurate and complete Judi Saa, DO          Note: This dictation was prepared with Dragon dictation along with smaller phrase technology. Any transcriptional errors that result from this process are unintentional.

## 2022-12-26 ENCOUNTER — Encounter: Payer: Self-pay | Admitting: Family Medicine

## 2022-12-26 ENCOUNTER — Ambulatory Visit: Payer: PPO | Admitting: Family Medicine

## 2022-12-26 VITALS — BP 122/84 | HR 78 | Ht 62.0 in | Wt 138.0 lb

## 2022-12-26 DIAGNOSIS — M9901 Segmental and somatic dysfunction of cervical region: Secondary | ICD-10-CM

## 2022-12-26 DIAGNOSIS — M9903 Segmental and somatic dysfunction of lumbar region: Secondary | ICD-10-CM | POA: Diagnosis not present

## 2022-12-26 DIAGNOSIS — M9902 Segmental and somatic dysfunction of thoracic region: Secondary | ICD-10-CM

## 2022-12-26 DIAGNOSIS — M5030 Other cervical disc degeneration, unspecified cervical region: Secondary | ICD-10-CM | POA: Diagnosis not present

## 2022-12-26 DIAGNOSIS — M9904 Segmental and somatic dysfunction of sacral region: Secondary | ICD-10-CM | POA: Diagnosis not present

## 2022-12-26 DIAGNOSIS — M9908 Segmental and somatic dysfunction of rib cage: Secondary | ICD-10-CM | POA: Diagnosis not present

## 2022-12-26 NOTE — Patient Instructions (Signed)
Great to see you Careful with cows See me in 6-8 weeks

## 2022-12-26 NOTE — Assessment & Plan Note (Signed)
Continued difficulty.  Does have tightness noted in the paraspinal musculature, does have muscle tightness noted of the neck area.  Will continue to monitor.  Did have some difficulty with her right hip today that we will also monitor.  Follow-up again in 6 to 8 weeks.

## 2023-01-15 ENCOUNTER — Other Ambulatory Visit: Payer: Self-pay

## 2023-01-15 ENCOUNTER — Ambulatory Visit (AMBULATORY_SURGERY_CENTER): Payer: PPO

## 2023-01-15 VITALS — Ht 62.0 in | Wt 140.0 lb

## 2023-01-15 DIAGNOSIS — Z1211 Encounter for screening for malignant neoplasm of colon: Secondary | ICD-10-CM

## 2023-01-15 MED ORDER — NA SULFATE-K SULFATE-MG SULF 17.5-3.13-1.6 GM/177ML PO SOLN
1.0000 | Freq: Once | ORAL | 0 refills | Status: AC
  Filled 2023-01-15: qty 354, 1d supply, fill #0

## 2023-01-15 NOTE — Progress Notes (Signed)

## 2023-01-16 ENCOUNTER — Encounter: Payer: Self-pay | Admitting: Internal Medicine

## 2023-01-16 ENCOUNTER — Other Ambulatory Visit: Payer: Self-pay

## 2023-01-29 ENCOUNTER — Ambulatory Visit: Payer: PPO | Admitting: Internal Medicine

## 2023-01-29 ENCOUNTER — Encounter: Payer: Self-pay | Admitting: Internal Medicine

## 2023-01-29 VITALS — BP 116/73 | HR 69 | Temp 97.5°F | Resp 18 | Ht 62.0 in | Wt 135.0 lb

## 2023-01-29 DIAGNOSIS — Z1211 Encounter for screening for malignant neoplasm of colon: Secondary | ICD-10-CM

## 2023-01-29 DIAGNOSIS — F32A Depression, unspecified: Secondary | ICD-10-CM | POA: Diagnosis not present

## 2023-01-29 DIAGNOSIS — D120 Benign neoplasm of cecum: Secondary | ICD-10-CM

## 2023-01-29 MED ORDER — SODIUM CHLORIDE 0.9 % IV SOLN: 500.0000 mL | INTRAVENOUS | Status: DC

## 2023-01-29 NOTE — Op Note (Addendum)
[facility] Patient Name: Veronica [name] Procedure Date: 01/29/2023 9:19 AM MRN: [id number] Endoscopist: Iva Boop , MD, [id number] Age: 67 Referring MD:  Date of Birth: 08-24-1955 Gender: Female Account #: [id number] Procedure:                Colonoscopy Indications:              Screening for colorectal malignant neoplasm, Last                            colonoscopy: 2014 Medicines:                Monitored Anesthesia Care Procedure:                Pre-Anesthesia Assessment:                           - Prior to the procedure, a History and Physical                            was performed, and patient medications and                            allergies were reviewed. The patient's tolerance of                            previous anesthesia was also reviewed. The risks                            and benefits of the procedure and the sedation                            options and risks were discussed with the patient.                            All questions were answered, and informed consent                            was obtained. Prior Anticoagulants: The patient has                            taken no anticoagulant or antiplatelet agents. ASA                            Grade Assessment: I - A normal, healthy patient.                            After reviewing the risks and benefits, the patient                            was deemed in satisfactory condition to undergo the                            procedure.  After obtaining informed consent, the colonoscope                            was passed under direct vision. Throughout the                            procedure, the patient's blood pressure, pulse, and                            oxygen saturations were monitored continuously. The                            [id number]-HQ190L Colonoscope [id number] was introduced                            through the anus and advanced to the the cecum,                             identified by appendiceal orifice and ileocecal                            valve. The colonoscopy was performed without                            difficulty. The patient tolerated the procedure                            well. The quality of the bowel preparation was                            good. The ileocecal valve, appendiceal orifice, and                            rectum were photographed. The bowel preparation                            used was SUPREP via split dose instruction. Scope In: 9:27:01 AM Scope Out: 9:42:05 AM Scope Withdrawal Time: 0 hours 12 minutes 35 seconds  Total Procedure Duration: 0 hours 15 minutes 4 seconds  Findings:                 The perianal and digital rectal examinations were                            normal.                           A 3 mm polyp was found in the cecum. The polyp was                            sessile. The polyp was removed with a cold snare.                            Resection and retrieval were complete. Verification  of patient identification for the specimen was                            done. Estimated blood loss was minimal.                           The exam was otherwise without abnormality on                            direct and retroflexion views. Complications:            No immediate complications. Estimated Blood Loss:     Estimated blood loss was minimal. Impression:               - One 3 mm polyp in the cecum, removed with a cold                            snare. Resected and retrieved.                           - The examination was otherwise normal on direct                            and retroflexion views. Does have some incidental                            hypertrophied anal papillae. Recommendation:           - Patient has a contact number available for                            emergencies. The signs and symptoms of potential                            delayed  complications were discussed with the                            patient. Return to normal activities tomorrow.                            Written discharge instructions were provided to the                            patient.                           - Resume previous diet.                           - Continue present medications.                           - Repeat colonoscopy is recommended. The                            colonoscopy date will be determined after pathology  results from today's exam become available for                            review. She vomited second half of Suprep so                            consider different prep in future.                           - In recovery she indicated that her 24 yo mother                            was just dx w/ rectal cancer and her maternal                            grandmother had colon cancer in her 63's - told her                            not clear if that increases her risk but may and                            will consider 5 year recall - await pathology Iva Boop, MD 01/29/2023 9:48:26 AM This report has been signed electronically.

## 2023-01-29 NOTE — Patient Instructions (Addendum)
There was a tiny polyp removed today - all else ok. I will let you know pathology results and when to have another routine colonoscopy by mail and/or My Chart.  I appreciate the opportunity to care for you. Iva Boop, MD, FACG   YOU HAD AN ENDOSCOPIC PROCEDURE TODAY AT THE [facility]:   Refer to the procedure report that was given to you for any specific questions about what was found during the examination.  If the procedure report does not answer your questions, please call your gastroenterologist to clarify.  If you requested that your care partner not be given the details of your procedure findings, then the procedure report has been included in a sealed envelope for you to review at your convenience later.  YOU SHOULD EXPECT: Some feelings of bloating in the abdomen. Passage of more gas than usual.  Walking can help get rid of the air that was put into your GI tract during the procedure and reduce the bloating. If you had a lower endoscopy (such as a colonoscopy or flexible sigmoidoscopy) you may notice spotting of blood in your stool or on the toilet paper. If you underwent a bowel prep for your procedure, you may not have a normal bowel movement for a few days.  Please Note:  You might notice some irritation and congestion in your nose or some drainage.  This is from the oxygen used during your procedure.  There is no need for concern and it should clear up in a day or so.  SYMPTOMS TO REPORT IMMEDIATELY:  Following lower endoscopy (colonoscopy or flexible sigmoidoscopy):  Excessive amounts of blood in the stool  Significant tenderness or worsening of abdominal pains  Swelling of the abdomen that is new, acute  Fever of 100F or higher   For urgent or emergent issues, a gastroenterologist can be reached at any hour by calling [phone]. Do not use MyChart messaging for urgent concerns.    DIET:  We do recommend a small meal at first, but then you may proceed  to your regular diet.  Drink plenty of fluids but you should avoid alcoholic beverages for 24 hours.  ACTIVITY:  You should plan to take it easy for the rest of today and you should NOT DRIVE or use heavy machinery until tomorrow (because of the sedation medicines used during the test).    FOLLOW UP: Our staff will call the number listed on your records the next business day following your procedure.  We will call around 7:15- 8:00 am to check on you and address any questions or concerns that you may have regarding the information given to you following your procedure. If we do not reach you, we will leave a message.     If any biopsies were taken you will be contacted by phone or by letter within the next 1-3 weeks.  Please call us at [phone] if you have not heard about the biopsies in 3 weeks.    SIGNATURES/CONFIDENTIALITY: You and/or your care partner have signed paperwork which will be entered into your electronic medical record.  These signatures attest to the fact that that the information above on your After Visit Summary has been reviewed and is understood.  Full responsibility of the confidentiality of this discharge information lies with you and/or your care-partner.

## 2023-01-29 NOTE — Progress Notes (Signed)
Sedate, gd SR, tolerated procedure well, VSS, report to RN 

## 2023-01-29 NOTE — Progress Notes (Signed)
[facility] History and Physical   Primary Care Physician:  Doreene Nest, NP   Reason for Procedure:    Encounter Diagnosis  Name Primary?   Special screening for malignant neoplasms, colon Yes     Plan:    colonoscopy     HPI: Veronica [name] is a 67 y.o. female here for repeat screening exam, last done 2014   Past Medical History:  Diagnosis Date   COVID-19 virus infection 04/26/2020   Depression    Dry eyes     Past Surgical History:  Procedure Laterality Date   CLOSED REDUCTION HAND FRACTURE  04/02/2000   left   COLONOSCOPY     EYE SURGERY  2000   Lasix   FRACTURE SURGERY     Hand   LASIK      Prior to Admission medications   Medication Sig Start Date End Date Taking? Authorizing Provider  acetaminophen (TYLENOL) 500 MG tablet Take 500 mg by mouth every 6 (six) hours as needed.   Yes [provider]  buPROPion (WELLBUTRIN XL) 150 MG 24 hr tablet Take 1 tablet (150 mg total) by mouth daily for anxiety and depression. 11/20/22  Yes Doreene Nest, NP  cholecalciferol (VITAMIN D3) 25 MCG (1000 UNIT) tablet Take 2,000 Units by mouth daily.   Yes [provider]  celecoxib (CELEBREX) 100 MG capsule TAKE 1 CAPSULE TWICE DAILY AS NEEDED FOR PAIN 09/04/22   Doreene Nest, NP  Diclofenac Sodium (PENNSAID) 2 % SOLN Place 1 application onto the skin 2 (two) times daily. Patient not taking: Reported on 01/15/2023 02/25/18   Myra Rude, MD  gabapentin (NEURONTIN) 100 MG capsule TAKE 3 CAPSULES AT BEDTIME Patient not taking: Reported on 01/15/2023 08/20/22   Judi Saa, DO  SUMAtriptan-naproxen (TREXIMET) 85-500 MG tablet Take 1 tablet by mouth at migraine onset. May repeat in 2 hours if migraine persists, do not exceed 2 tablets in 24 hours. Patient not taking: Reported on 01/15/2023 06/20/17   Doreene Nest, NP  triamcinolone cream (KENALOG) 0.1 % Apply topically as needed. 10/09/20   [provider]     Current Outpatient Medications  Medication Sig Dispense Refill   acetaminophen (TYLENOL) 500 MG tablet Take 500 mg by mouth every 6 (six) hours as needed.     buPROPion (WELLBUTRIN XL) 150 MG 24 hr tablet Take 1 tablet (150 mg total) by mouth daily for anxiety and depression. 90 tablet 3   cholecalciferol (VITAMIN D3) 25 MCG (1000 UNIT) tablet Take 2,000 Units by mouth daily.     celecoxib (CELEBREX) 100 MG capsule TAKE 1 CAPSULE TWICE DAILY AS NEEDED FOR PAIN 180 capsule 0   Diclofenac Sodium (PENNSAID) 2 % SOLN Place 1 application onto the skin 2 (two) times daily. (Patient not taking: Reported on 01/15/2023) 1 Bottle 3   gabapentin (NEURONTIN) 100 MG capsule TAKE 3 CAPSULES AT BEDTIME (Patient not taking: Reported on 01/15/2023) 180 capsule 0   SUMAtriptan-naproxen (TREXIMET) 85-500 MG tablet Take 1 tablet by mouth at migraine onset. May repeat in 2 hours if migraine persists, do not exceed 2 tablets in 24 hours. (Patient not taking: Reported on 01/15/2023) 10 tablet 0   triamcinolone cream (KENALOG) 0.1 % Apply topically as needed.     Current Facility-Administered Medications  Medication Dose Route Frequency Provider Last Rate Last Admin   0.9 %  sodium chloride infusion  500 mL Intravenous Continuous Iva Boop, MD        Allergies  as of 01/29/2023   (No Known Allergies)    Family History  Problem Relation Age of Onset   Hypertension Mother    Heart disease Mother    Rectal cancer Mother    COPD Father    Stroke Father    Colon cancer Maternal Grandmother    Esophageal cancer Neg Hx    Stomach cancer Neg Hx    Colon polyps Neg Hx     Social History   Socioeconomic History   Marital status: Married    Spouse name: Not on file   Number of children: Not on file   Years of education: Not on file   Highest education level: Not on file  Occupational History   Not on file  Tobacco Use   Smoking status: Former    Current packs/day: 0.00    Average packs/day: 0.5  packs/day for 4.0 years (2.0 ttl pk-yrs)    Types: Cigarettes    Quit date: 06/11/1988    Years since quitting: 34.6   Smokeless tobacco: Never  Vaping Use   Vaping status: Never Used  Substance and Sexual Activity   Alcohol use: Yes    Alcohol/week: 6.0 standard drinks of alcohol    Types: 6 Glasses of wine per week   Drug use: No   Sexual activity: Yes    Birth control/protection: None  Other Topics Concern   Not on file  Social History Narrative   Married.   No children.   Works for [employer].   Enjoys showing and riding horses.    Social Determinants of Health   Financial Resource Strain: Not on file  Food Insecurity: Not on file  Transportation Needs: Not on file  Physical Activity: Not on file  Stress: Not on file  Social Connections: Not on file  Intimate Partner Violence: Not on file    Review of Systems:  All other review of systems negative except as mentioned in the HPI.  Physical Exam: Vital signs BP (!) 140/90   Pulse 63   Temp (!) 97.5 F (36.4 C)   Ht 5\' 2"  (1.575 m)   Wt 135 lb (61.2 kg)   SpO2 100%   BMI 24.69 kg/m   General:   Alert,  Well-developed, well-nourished, pleasant and cooperative in NAD Lungs:  Clear throughout to auscultation.   Heart:  Regular rate and rhythm; no murmurs, clicks, rubs,  or gallops. Abdomen:  Soft, nontender and nondistended. Normal bowel sounds.   Neuro/Psych:  Alert and cooperative. Normal mood and affect. A and O x 3   @Duwayne Matters  Sena Slate, MD, [facility] [phone] (pager) 01/29/2023 9:19 AM@

## 2023-01-29 NOTE — Progress Notes (Signed)
Called to room to assist during endoscopic procedure.  Patient ID and intended procedure confirmed with present staff. Received instructions for my participation in the procedure from the performing physician.  

## 2023-01-30 ENCOUNTER — Ambulatory Visit (INDEPENDENT_AMBULATORY_CARE_PROVIDER_SITE_OTHER): Payer: PPO

## 2023-01-30 ENCOUNTER — Telehealth: Payer: Self-pay | Admitting: *Deleted

## 2023-01-30 DIAGNOSIS — Z860101 Personal history of adenomatous and serrated colon polyps: Secondary | ICD-10-CM | POA: Insufficient documentation

## 2023-01-30 DIAGNOSIS — Z23 Encounter for immunization: Secondary | ICD-10-CM | POA: Diagnosis not present

## 2023-01-30 HISTORY — DX: Personal history of adenomatous and serrated colon polyps: Z86.0101

## 2023-01-30 NOTE — Telephone Encounter (Signed)
  Follow up Call-     01/29/2023    8:48 AM  Call back number  Post procedure Call Back phone  # [phone]  Permission to leave phone message Yes     Patient questions:  Do you have a fever, pain , or abdominal swelling? No. Pain Score  0 *  Have you tolerated food without any problems? Yes.    Have you been able to return to your normal activities? Yes.    Do you have any questions about your discharge instructions: Diet   No. Medications  No. Follow up visit  No.  Do you have questions or concerns about your Care? No.  Actions: * If pain score is 4 or above: No action needed, pain <4.

## 2023-02-01 ENCOUNTER — Encounter: Payer: Self-pay | Admitting: Internal Medicine

## 2023-02-01 LAB — SURGICAL PATHOLOGY

## 2023-02-07 NOTE — Progress Notes (Signed)
[facility] [address] [place] [address] Phone: [phone] Subjective:   Veronica [name], am serving as a scribe for Dr. Antoine Primas.  I'm seeing this patient by the request  of:  Doreene Nest, NP  CC: Neck pain being the worst  [id number]  Veronica [name] is a 67 y.o. female coming in with complaint of back and neck pain. OMT on 12/26/2022. Patient states here for routine OMT   Medications patient has been prescribed:   Taking:         Reviewed prior external information including notes and imaging from previsou exam, outside providers and external EMR if available.   As well as notes that were available from care everywhere and other healthcare systems.  Past medical history, social, surgical and family history all reviewed in electronic medical record.  No pertanent information unless stated regarding to the chief complaint.   Past Medical History:  Diagnosis Date   COVID-19 virus infection 04/26/2020   Depression    Dry eyes    Hx of adenomatous polyp of colon + FHx CRCA 01/30/2023    No Known Allergies   Review of Systems:  No headache, visual changes, nausea, vomiting, diarrhea, constipation, dizziness, abdominal pain, skin rash, fevers, chills, night sweats, weight loss, swollen lymph nodes, body aches, joint swelling, chest pain, shortness of breath, mood changes. POSITIVE muscle aches  Objective  Blood pressure 102/60, pulse (!) 57, height 5\' 2"  (1.575 m), weight 140 lb (63.5 kg), SpO2 97%.   General: No apparent distress alert and oriented x3 mood and affect normal, dressed appropriately.  HEENT: Pupils equal, extraocular movements intact  Respiratory: Patient's speak in full sentences and does not appear short of breath  Cardiovascular: No lower extremity edema, non tender, no erythema  Low back exam does have some loss lordosis noted.  Some tenderness to palpation in the paraspinal musculature.  On neck  exam does have limited sidebending to the left as well as limited extension of 5 degrees.  Likely no radicular symptoms.  Osteopathic findings  C2 flexed rotated and side bent right C6 flexed rotated and side bent left C7 flexed rotated and side bent left T3 extended rotated and side bent right inhaled rib T9 extended rotated and side bent left L2 flexed rotated and side bent right Sacrum right on right       Assessment and Plan:  Degenerative disc disease, cervical Will continue to have difficulty overall.  Discussed icing regimen and home exercises, discussed which activities to do and which ones to avoid.  No change in medication at the moment.  Attempted osteopathic manipulation.  Feels like was only getting very seldom amount of improvement with the injections would like to hold on this as well.  Follow-up with me again in 6 to 8 weeks    Nonallopathic problems  Decision today to treat with OMT was based on Physical Exam  After verbal consent patient was treated with HVLA, ME, FPR techniques in cervical, rib, thoracic, lumbar, and sacral  areas  Patient tolerated the procedure well with improvement in symptoms  Patient given exercises, stretches and lifestyle modifications  See medications in patient instructions if given  Patient will follow up in 4-8 weeks    The above documentation has been reviewed and is accurate and complete Judi Saa, DO          Note: This dictation was prepared with Dragon dictation along with smaller phrase technology. Any transcriptional  errors that result from this process are unintentional.

## 2023-02-11 ENCOUNTER — Encounter: Payer: Self-pay | Admitting: Family Medicine

## 2023-02-11 ENCOUNTER — Ambulatory Visit: Payer: PPO | Admitting: Family Medicine

## 2023-02-11 VITALS — BP 102/60 | HR 57 | Ht 62.0 in | Wt 140.0 lb

## 2023-02-11 DIAGNOSIS — M9903 Segmental and somatic dysfunction of lumbar region: Secondary | ICD-10-CM

## 2023-02-11 DIAGNOSIS — M9901 Segmental and somatic dysfunction of cervical region: Secondary | ICD-10-CM

## 2023-02-11 DIAGNOSIS — M9902 Segmental and somatic dysfunction of thoracic region: Secondary | ICD-10-CM | POA: Diagnosis not present

## 2023-02-11 DIAGNOSIS — M9904 Segmental and somatic dysfunction of sacral region: Secondary | ICD-10-CM

## 2023-02-11 DIAGNOSIS — M5030 Other cervical disc degeneration, unspecified cervical region: Secondary | ICD-10-CM

## 2023-02-11 DIAGNOSIS — M9908 Segmental and somatic dysfunction of rib cage: Secondary | ICD-10-CM

## 2023-02-11 NOTE — Patient Instructions (Addendum)
Good to see you Good luck in [place]  Follow up in 7-8 weeks

## 2023-02-11 NOTE — Assessment & Plan Note (Signed)
Will continue to have difficulty overall.  Discussed icing regimen and home exercises, discussed which activities to do and which ones to avoid.  No change in medication at the moment.  Attempted osteopathic manipulation.  Feels like was only getting very seldom amount of improvement with the injections would like to hold on this as well.  Follow-up with me again in 6 to 8 weeks

## 2023-02-14 ENCOUNTER — Other Ambulatory Visit: Payer: Self-pay

## 2023-02-14 ENCOUNTER — Other Ambulatory Visit: Payer: Self-pay | Admitting: Primary Care

## 2023-02-14 DIAGNOSIS — M159 Polyosteoarthritis, unspecified: Secondary | ICD-10-CM

## 2023-02-14 MED ORDER — CELECOXIB 100 MG PO CAPS
100.0000 mg | ORAL_CAPSULE | Freq: Two times a day (BID) | ORAL | 0 refills | Status: DC | PRN
  Filled 2023-02-14: qty 180, 90d supply, fill #0

## 2023-03-20 ENCOUNTER — Other Ambulatory Visit: Payer: Self-pay

## 2023-03-20 DIAGNOSIS — Z4789 Encounter for other orthopedic aftercare: Secondary | ICD-10-CM | POA: Diagnosis not present

## 2023-03-20 DIAGNOSIS — M65311 Trigger thumb, right thumb: Secondary | ICD-10-CM | POA: Diagnosis not present

## 2023-03-20 MED ORDER — CEPHALEXIN 500 MG PO CAPS
500.0000 mg | ORAL_CAPSULE | Freq: Four times a day (QID) | ORAL | 0 refills | Status: DC
  Filled 2023-03-20: qty 28, 7d supply, fill #0

## 2023-03-20 MED ORDER — OXYCODONE HCL 5 MG PO TABS
5.0000 mg | ORAL_TABLET | ORAL | 0 refills | Status: DC | PRN
  Filled 2023-03-20: qty 42, 7d supply, fill #0

## 2023-03-22 NOTE — Progress Notes (Unsigned)
  [facility] [address] [place] [address] Phone: [phone] Subjective:   Veronica [name], am serving as a scribe for Dr. Antoine Primas.  I'm seeing this patient by the request  of:  Doreene Nest, NP  CC: neck and back pain follow up   [id number]  Veronica [name] is a 67 y.o. female coming in with complaint of back and neck pain. OMT on 02/11/2023. Patient states that she is the same as last visit.  Patient did have surgery for her trigger thumb previously.  Feels like it is making improvement.         Reviewed prior external information including notes and imaging from previsou exam, outside providers and external EMR if available.   As well as notes that were available from care everywhere and other healthcare systems.  Past medical history, social, surgical and family history all reviewed in electronic medical record.  No pertanent information unless stated regarding to the chief complaint.   Past Medical History:  Diagnosis Date   COVID-19 virus infection 04/26/2020   Depression    Dry eyes    Hx of adenomatous polyp of colon + FHx CRCA 01/30/2023    No Known Allergies   Review of Systems:  No headache, visual changes, nausea, vomiting, diarrhea, constipation, dizziness, abdominal pain, skin rash, fevers, chills, night sweats, weight loss, swollen lymph nodes, body aches, joint swelling, chest pain, shortness of breath, mood changes. POSITIVE muscle aches  Objective  Blood pressure 104/72, pulse 87, height 5\' 2"  (1.575 m), weight 147 lb (66.7 kg), SpO2 98%.   General: No apparent distress alert and oriented x3 mood and affect normal, dressed appropriately.  HEENT: Pupils equal, extraocular movements intact  Respiratory: Patient's speak in full sentences and does not appear short of breath  Cardiovascular: No lower extremity edema, non tender, no erythema  Gait MSK:  Back back does have some loss lordosis.  Tightness  noted in the neck as well.  Does have had some mild limitation in her  Osteopathic findings  C2 flexed rotated and side bent right C7 flexed rotated and side bent left T3 extended rotated and side bent right inhaled rib T9 extended rotated and side bent left L2 flexed rotated and side bent right Sacrum right on right       Assessment and Plan:  Degenerative disc disease, cervical Degenerative arthritic changes in the neck noted.  Patient does still have some difficulty with sidebending bilaterally.  No other changes in medication at this point.  Has taken gabapentin previously.    Nonallopathic problems  Decision today to treat with OMT was based on Physical Exam  After verbal consent patient was treated with HVLA, ME, FPR techniques in cervical, rib, thoracic, lumbar, and sacral  areas  Patient tolerated the procedure well with improvement in symptoms  Patient given exercises, stretches and lifestyle modifications  See medications in patient instructions if given  Patient will follow up in 4-8 weeks     The above documentation has been reviewed and is accurate and complete Judi Saa, DO         Note: This dictation was prepared with Dragon dictation along with smaller phrase technology. Any transcriptional errors that result from this process are unintentional.

## 2023-03-25 ENCOUNTER — Encounter: Payer: Self-pay | Admitting: Family Medicine

## 2023-03-25 ENCOUNTER — Ambulatory Visit: Payer: PPO | Admitting: Family Medicine

## 2023-03-25 VITALS — BP 104/72 | HR 87 | Ht 62.0 in | Wt 147.0 lb

## 2023-03-25 DIAGNOSIS — M9901 Segmental and somatic dysfunction of cervical region: Secondary | ICD-10-CM

## 2023-03-25 DIAGNOSIS — M9904 Segmental and somatic dysfunction of sacral region: Secondary | ICD-10-CM | POA: Diagnosis not present

## 2023-03-25 DIAGNOSIS — M9902 Segmental and somatic dysfunction of thoracic region: Secondary | ICD-10-CM | POA: Diagnosis not present

## 2023-03-25 DIAGNOSIS — M9908 Segmental and somatic dysfunction of rib cage: Secondary | ICD-10-CM

## 2023-03-25 DIAGNOSIS — M9903 Segmental and somatic dysfunction of lumbar region: Secondary | ICD-10-CM | POA: Diagnosis not present

## 2023-03-25 DIAGNOSIS — M5030 Other cervical disc degeneration, unspecified cervical region: Secondary | ICD-10-CM

## 2023-03-25 NOTE — Patient Instructions (Signed)
Good to see you! See you again in 7-8 weeks Happy Holidays

## 2023-03-25 NOTE — Assessment & Plan Note (Signed)
Degenerative arthritic changes in the neck noted.  Patient does still have some difficulty with sidebending bilaterally.  No other changes in medication at this point.  Has taken gabapentin previously.

## 2023-04-04 DIAGNOSIS — M25641 Stiffness of right hand, not elsewhere classified: Secondary | ICD-10-CM | POA: Diagnosis not present

## 2023-04-09 ENCOUNTER — Other Ambulatory Visit: Payer: Self-pay | Admitting: Primary Care

## 2023-04-09 DIAGNOSIS — Z1231 Encounter for screening mammogram for malignant neoplasm of breast: Secondary | ICD-10-CM

## 2023-05-13 ENCOUNTER — Ambulatory Visit: Payer: PPO | Admitting: Family Medicine

## 2023-05-14 NOTE — Progress Notes (Unsigned)
[facility] [address] [place] [address] Phone: [phone] Subjective:   Veronica [name] am a scribe for Dr. Katrinka Blazing.  I'm seeing this patient by the request  of:  Doreene Nest, NP  CC: Back and neck pain follow-up  [id number]  Chayse Gracey is a 68 y.o. female coming in with complaint of back and neck pain. OMT on 03/25/2023. Patient states left side of neck has been terrible. Feels like a knot is there. Back is doing ok.   Medications patient has been prescribed:   Taking:     Since we have seen patient did have the surgery for her trigger finger.    Reviewed prior external information including notes and imaging from previsou exam, outside providers and external EMR if available.   As well as notes that were available from care everywhere and other healthcare systems.  Past medical history, social, surgical and family history all reviewed in electronic medical record.  No pertanent information unless stated regarding to the chief complaint.   Past Medical History:  Diagnosis Date   COVID-19 virus infection 04/26/2020   Depression    Dry eyes    Hx of adenomatous polyp of colon + FHx CRCA 01/30/2023    No Known Allergies   Review of Systems:  No headache, visual changes, nausea, vomiting, diarrhea, constipation, dizziness, abdominal pain, skin rash, fevers, chills, night sweats, weight loss, swollen lymph nodes, body aches, joint swelling, chest pain, shortness of breath, mood changes. POSITIVE muscle aches  Objective  Blood pressure 130/70, pulse 70, height 5\' 2"  (1.575 m), weight 146 lb (66.2 kg), SpO2 99%.   General: No apparent distress alert and oriented x3 mood and affect normal, dressed appropriately.  HEENT: Pupils equal, extraocular movements intact  Respiratory: Patient's speak in full sentences and does not appear short of breath  Cardiovascular: No lower extremity edema, non tender, no erythema  Gait  relatively normal MSK:  Back does have some loss of lordosis.   Neck exam does have significant tightness noted in the parascapular area left greater than right.  Significant number of trigger points noted in the left shoulder region.  Seems to be in the latissimus dorsi, rhomboid, and trapezius.   Osteopathic findings  C2 flexed rotated and side bent left C6 flexed rotated and side bent left T3 extended rotated and side bent left inhaled rib L2 flexed rotated and side bent right Sacrum right on right  After verbal consent patient was prepped with alcohol swab and with a 25-gauge half inch needle injected in 4 distinct trigger points in the latissimus dorsi, trapezius, and rhomboid musculature.  Total of 3 cc of 0.5% Marcaine and 1 cc of Kenalog 40 mg/mL used.  Minimal blood loss.  Band-Aids placed.  Postinjection instructions given   Assessment and Plan:  Trigger point of left shoulder region Patient given injections today.  Tolerated the procedure well.  Has been quite sometime since we have done this previously.  Hopeful that this will make significant improvement.  Discussed icing regimen and home exercises.  Follow-up again in 6 to 8 weeks  Degenerative disc disease, cervical Degenerative neck arthritis noted.  Discussed crepitus to avoid certain activities.  Patient is a highly active individual and will continue to do so.  Follow-up with me again in 6 to 8 weeks    Nonallopathic problems  Decision today to treat with OMT was based on Physical Exam  After verbal consent patient was treated with  HVLA, ME, FPR techniques in cervical, rib, thoracic, lumbar, and sacral  areas  Patient tolerated the procedure well with improvement in symptoms  Patient given exercises, stretches and lifestyle modifications  See medications in patient instructions if given  Patient will follow up in 4-8 weeks     The above documentation has been reviewed and is accurate and complete Judi Saa, DO         Note: This dictation was prepared with Dragon dictation along with smaller phrase technology. Any transcriptional errors that result from this process are unintentional.

## 2023-05-15 ENCOUNTER — Other Ambulatory Visit: Payer: Self-pay

## 2023-05-15 ENCOUNTER — Ambulatory Visit: Payer: PPO | Admitting: Family Medicine

## 2023-05-15 VITALS — BP 130/70 | HR 70 | Ht 62.0 in | Wt 146.0 lb

## 2023-05-15 DIAGNOSIS — M9903 Segmental and somatic dysfunction of lumbar region: Secondary | ICD-10-CM

## 2023-05-15 DIAGNOSIS — M9908 Segmental and somatic dysfunction of rib cage: Secondary | ICD-10-CM | POA: Diagnosis not present

## 2023-05-15 DIAGNOSIS — M9901 Segmental and somatic dysfunction of cervical region: Secondary | ICD-10-CM | POA: Diagnosis not present

## 2023-05-15 DIAGNOSIS — M9904 Segmental and somatic dysfunction of sacral region: Secondary | ICD-10-CM | POA: Diagnosis not present

## 2023-05-15 DIAGNOSIS — M9902 Segmental and somatic dysfunction of thoracic region: Secondary | ICD-10-CM

## 2023-05-15 DIAGNOSIS — M5030 Other cervical disc degeneration, unspecified cervical region: Secondary | ICD-10-CM

## 2023-05-15 DIAGNOSIS — M25512 Pain in left shoulder: Secondary | ICD-10-CM | POA: Diagnosis not present

## 2023-05-15 MED ORDER — TIZANIDINE HCL 4 MG PO TABS
4.0000 mg | ORAL_TABLET | Freq: Every day | ORAL | 1 refills | Status: DC
  Filled 2023-05-15: qty 30, 30d supply, fill #0
  Filled 2023-08-14: qty 30, 30d supply, fill #1

## 2023-05-15 NOTE — Assessment & Plan Note (Signed)
Degenerative neck arthritis noted.  Discussed crepitus to avoid certain activities.  Patient is a highly active individual and will continue to do so.  Follow-up with me again in 6 to 8 weeks

## 2023-05-15 NOTE — Patient Instructions (Addendum)
Good to see you. Trigger point injections today.  Return in 6 weeks.

## 2023-05-15 NOTE — Assessment & Plan Note (Signed)
Patient given injections today.  Tolerated the procedure well.  Has been quite sometime since we have done this previously.  Hopeful that this will make significant improvement.  Discussed icing regimen and home exercises.  Follow-up again in 6 to 8 weeks

## 2023-06-13 ENCOUNTER — Ambulatory Visit
Admission: RE | Admit: 2023-06-13 | Discharge: 2023-06-13 | Disposition: A | Discharge status: Home / Self Care | Payer: PPO | Source: Ambulatory Visit | Attending: Primary Care | Admitting: Primary Care

## 2023-06-13 DIAGNOSIS — E2839 Other primary ovarian failure: Secondary | ICD-10-CM

## 2023-06-13 DIAGNOSIS — Z1231 Encounter for screening mammogram for malignant neoplasm of breast: Secondary | ICD-10-CM | POA: Insufficient documentation

## 2023-06-13 DIAGNOSIS — M8589 Other specified disorders of bone density and structure, multiple sites: Secondary | ICD-10-CM | POA: Diagnosis not present

## 2023-06-13 DIAGNOSIS — Z780 Asymptomatic menopausal state: Secondary | ICD-10-CM | POA: Diagnosis not present

## 2023-06-13 NOTE — Progress Notes (Signed)
  [facility] [address] [place] [address] Phone: [phone] Subjective:   Bruce Donath, am serving as a scribe for Dr. Antoine Primas.  I'm seeing this patient by the request  of:  Doreene Nest, NP  CC: Back and neck pain follow-up  [id number]  Veronica [name] is a 68 y.o. female coming in with complaint of back and neck pain. OMT on 05/15/2023. Patient states that her neck is tight and inferior to L scapula. States that she is walking more on her treadmill.   Medications patient has been prescribed: Zanaflex  Taking:         Reviewed prior external information including notes and imaging from previsou exam, outside providers and external EMR if available.   As well as notes that were available from care everywhere and other healthcare systems.  Past medical history, social, surgical and family history all reviewed in electronic medical record.  No pertanent information unless stated regarding to the chief complaint.   Past Medical History:  Diagnosis Date   COVID-19 virus infection 04/26/2020   Depression    Dry eyes    Hx of adenomatous polyp of colon + FHx CRCA 01/30/2023    No Known Allergies   Review of Systems:  No headache, visual changes, nausea, vomiting, diarrhea, constipation, dizziness, abdominal pain, skin rash, fevers, chills, night sweats, weight loss, swollen lymph nodes, body aches, joint swelling, chest pain, shortness of breath, mood changes. POSITIVE muscle aches  Objective  Blood pressure 122/82, pulse 70, height 5\' 2"  (1.575 m), SpO2 99%.   General: No apparent distress alert and oriented x3 mood and affect normal, dressed appropriately.  HEENT: Pupils equal, extraocular movements intact  Respiratory: Patient's speak in full sentences and does not appear short of breath  Cardiovascular: No lower extremity edema, non tender, no erythema  Gait MSK:  Back does have some loss lordosis noted.  Neck  exam does have some limited right sidebending bilaterally.  Tightness noted in the paraspinal musculature in multiple different joints.  Crepitus noted in the neck.  Osteopathic findings  C2 flexed rotated and side bent right T4 extended rotated and side bent right inhaled rib L4 flexed rotated and side bent right Sacrum right on right       Assessment and Plan:  Degenerative disc disease, cervical Patient does have MRI showing the nerve root impingements.  Discussed icing regimen and home exercises, which activities to do and which ones to avoid.  Increase activity slowly otherwise.  Discussed icing regimen.  Increase activity slowly.  Discussed avoiding certain activities that I think could be more detrimental.  Follow-up again in 6 to 8 weeks.    Nonallopathic problems  Decision today to treat with OMT was based on Physical Exam  After verbal consent patient was treated with HVLA, ME, FPR techniques in cervical, rib, thoracic, lumbar, and sacral  areas  Patient tolerated the procedure well with improvement in symptoms  Patient given exercises, stretches and lifestyle modifications  See medications in patient instructions if given  Patient will follow up in 4-8 weeks     The above documentation has been reviewed and is accurate and complete Judi Saa, DO         Note: This dictation was prepared with Dragon dictation along with smaller phrase technology. Any transcriptional errors that result from this process are unintentional.

## 2023-06-18 ENCOUNTER — Encounter: Payer: Self-pay | Admitting: Family Medicine

## 2023-06-18 ENCOUNTER — Ambulatory Visit: Payer: PPO | Admitting: Family Medicine

## 2023-06-18 VITALS — BP 122/82 | HR 70 | Ht 62.0 in

## 2023-06-18 DIAGNOSIS — M9903 Segmental and somatic dysfunction of lumbar region: Secondary | ICD-10-CM | POA: Diagnosis not present

## 2023-06-18 DIAGNOSIS — M9902 Segmental and somatic dysfunction of thoracic region: Secondary | ICD-10-CM | POA: Diagnosis not present

## 2023-06-18 DIAGNOSIS — M9904 Segmental and somatic dysfunction of sacral region: Secondary | ICD-10-CM

## 2023-06-18 DIAGNOSIS — M9901 Segmental and somatic dysfunction of cervical region: Secondary | ICD-10-CM | POA: Diagnosis not present

## 2023-06-18 DIAGNOSIS — M5030 Other cervical disc degeneration, unspecified cervical region: Secondary | ICD-10-CM

## 2023-06-18 DIAGNOSIS — M9908 Segmental and somatic dysfunction of rib cage: Secondary | ICD-10-CM

## 2023-06-18 NOTE — Assessment & Plan Note (Signed)
 Patient does have MRI showing the nerve root impingements.  Discussed icing regimen and home exercises, which activities to do and which ones to avoid.  Increase activity slowly otherwise.  Discussed icing regimen.  Increase activity slowly.  Discussed avoiding certain activities that I think could be more detrimental.  Follow-up again in 6 to 8 weeks.

## 2023-06-18 NOTE — Patient Instructions (Signed)
 Good to see you Hope you have a great show See me in 6-8 weeks

## 2023-07-30 NOTE — Progress Notes (Unsigned)
  [facility] [address] [place] [address] Phone: [phone] Subjective:   IBryan Caprio, am serving as a scribe for Dr. Ronnell Coins.  I'm seeing this patient by the request  of:  Gabriel John, NP  CC: Back and neck pain follow-up  [id number]  Veronica [name] is a 68 y.o. female coming in with complaint of back and neck pain. OMT on 06/18/2023. Patient states no new symptoms. Same per usual.  Medications patient has been prescribed: gabapentin   Taking:         Reviewed prior external information including notes and imaging from previsou exam, outside providers and external EMR if available.   As well as notes that were available from care everywhere and other healthcare systems.  Past medical history, social, surgical and family history all reviewed in electronic medical record.  No pertanent information unless stated regarding to the chief complaint.   Past Medical History:  Diagnosis Date   COVID-19 virus infection 04/26/2020   Depression    Dry eyes    Hx of adenomatous polyp of colon + FHx CRCA 01/30/2023    No Known Allergies   Review of Systems:  No headache, visual changes, nausea, vomiting, diarrhea, constipation, dizziness, abdominal pain, skin rash, fevers, chills, night sweats, weight loss, swollen lymph nodes, body aches, joint swelling, chest pain, shortness of breath, mood changes. POSITIVE muscle aches  Objective  There were no vitals taken for this visit.   General: No apparent distress alert and oriented x3 mood and affect normal, dressed appropriately.  HEENT: Pupils equal, extraocular movements intact  Respiratory: Patient's speak in full sentences and does not appear short of breath  Cardiovascular: No lower extremity edema, non tender, no erythema  Neck exam does have some loss lordosis noted.  Tightness noted with sidebending bilaterally.  Osteopathic findings  C2 flexed rotated and side bent  right C6 flexed rotated and side bent left T3 extended rotated and side bent right inhaled rib T9 extended rotated and side bent left L1 flexed rotated and side bent right L4 flexed rotated and side bent left Sacrum right on right       Assessment and Plan:  No problem-specific Assessment & Plan notes found for this encounter.    Nonallopathic problems  Decision today to treat with OMT was based on Physical Exam  After verbal consent patient was treated with HVLA, ME, FPR techniques in cervical, rib, thoracic, lumbar, and sacral  areas  Patient tolerated the procedure well with improvement in symptoms  Patient given exercises, stretches and lifestyle modifications  See medications in patient instructions if given  Patient will follow up in 4-8 weeks    The above documentation has been reviewed and is accurate and complete Veronica Hemrick M Krystan Northrop, DO          Note: This dictation was prepared with Dragon dictation along with smaller phrase technology. Any transcriptional errors that result from this process are unintentional.

## 2023-07-31 ENCOUNTER — Encounter: Payer: Self-pay | Admitting: Family Medicine

## 2023-07-31 ENCOUNTER — Ambulatory Visit: Admitting: Family Medicine

## 2023-07-31 VITALS — BP 116/76 | HR 77 | Ht 62.0 in | Wt 138.0 lb

## 2023-07-31 DIAGNOSIS — M9903 Segmental and somatic dysfunction of lumbar region: Secondary | ICD-10-CM

## 2023-07-31 DIAGNOSIS — M9902 Segmental and somatic dysfunction of thoracic region: Secondary | ICD-10-CM

## 2023-07-31 DIAGNOSIS — M9908 Segmental and somatic dysfunction of rib cage: Secondary | ICD-10-CM | POA: Diagnosis not present

## 2023-07-31 DIAGNOSIS — M9901 Segmental and somatic dysfunction of cervical region: Secondary | ICD-10-CM | POA: Diagnosis not present

## 2023-07-31 DIAGNOSIS — M5030 Other cervical disc degeneration, unspecified cervical region: Secondary | ICD-10-CM | POA: Diagnosis not present

## 2023-07-31 DIAGNOSIS — M9904 Segmental and somatic dysfunction of sacral region: Secondary | ICD-10-CM | POA: Diagnosis not present

## 2023-07-31 NOTE — Patient Instructions (Signed)
 Good to see you! Good luck at horse show See you again in 6-8 weeks

## 2023-07-31 NOTE — Assessment & Plan Note (Signed)
 Known arthritic changes.  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  Discussed with patient about icing regimen and home exercises.  Patient does do a lot of traveling and seems to be one of the aggravating factors.  Follow-up again in 6 to 8 weeks

## 2023-08-14 ENCOUNTER — Other Ambulatory Visit: Payer: Self-pay | Admitting: Primary Care

## 2023-08-14 DIAGNOSIS — M159 Polyosteoarthritis, unspecified: Secondary | ICD-10-CM

## 2023-08-15 ENCOUNTER — Other Ambulatory Visit: Payer: Self-pay

## 2023-08-15 MED ORDER — CELECOXIB 100 MG PO CAPS
100.0000 mg | ORAL_CAPSULE | Freq: Two times a day (BID) | ORAL | 0 refills | Status: DC | PRN
  Filled 2023-08-15: qty 180, 90d supply, fill #0

## 2023-08-15 NOTE — Telephone Encounter (Signed)
 lvm for pt to call office to schedule cpe

## 2023-08-15 NOTE — Telephone Encounter (Signed)
Patient is due for CPE/follow up in late August, this will be required prior to any further refills.  Please schedule, thank you!   

## 2023-08-29 DIAGNOSIS — D2261 Melanocytic nevi of right upper limb, including shoulder: Secondary | ICD-10-CM | POA: Diagnosis not present

## 2023-08-29 DIAGNOSIS — D235 Other benign neoplasm of skin of trunk: Secondary | ICD-10-CM | POA: Diagnosis not present

## 2023-08-29 DIAGNOSIS — D2271 Melanocytic nevi of right lower limb, including hip: Secondary | ICD-10-CM | POA: Diagnosis not present

## 2023-08-29 DIAGNOSIS — L821 Other seborrheic keratosis: Secondary | ICD-10-CM | POA: Diagnosis not present

## 2023-08-29 DIAGNOSIS — D2272 Melanocytic nevi of left lower limb, including hip: Secondary | ICD-10-CM | POA: Diagnosis not present

## 2023-08-29 DIAGNOSIS — D485 Neoplasm of uncertain behavior of skin: Secondary | ICD-10-CM | POA: Diagnosis not present

## 2023-08-29 DIAGNOSIS — D2262 Melanocytic nevi of left upper limb, including shoulder: Secondary | ICD-10-CM | POA: Diagnosis not present

## 2023-08-29 DIAGNOSIS — D225 Melanocytic nevi of trunk: Secondary | ICD-10-CM | POA: Diagnosis not present

## 2023-09-17 NOTE — Progress Notes (Signed)
 [facility] [address] [place] [address] Phone: [phone] Subjective:   Veronica [name], am serving as a scribe for Dr. Ronnell Coins.  I'm seeing this patient by the request  of:  Gabriel John, NP  CC: back and neck pain follow up   [id number]  Veronica [name] is a 68 y.o. female coming in with complaint of back and neck pain. OMT 07/31/2023. Patient states that she just got back from [place] cruise.   Medications patient has been prescribed: Zanaflex   Taking:         Reviewed prior external information including notes and imaging from previsou exam, outside providers and external EMR if available.   As well as notes that were available from care everywhere and other healthcare systems.  Past medical history, social, surgical and family history all reviewed in electronic medical record.  No pertanent information unless stated regarding to the chief complaint.   Past Medical History:  Diagnosis Date   COVID-19 virus infection 04/26/2020   Depression    Dry eyes    Hx of adenomatous polyp of colon + FHx CRCA 01/30/2023    No Known Allergies   Review of Systems:  No headache, visual changes, nausea, vomiting, diarrhea, constipation, dizziness, abdominal pain, skin rash, fevers, chills, night sweats, weight loss, swollen lymph nodes, body aches, joint swelling, chest pain, shortness of breath, mood changes. POSITIVE muscle aches  Objective  Blood pressure 110/72, pulse 77, height 5' 2 (1.575 m), weight 138 lb (62.6 kg), SpO2 99%.   General: No apparent distress alert and oriented x3 mood and affect normal, dressed appropriately.  HEENT: Pupils equal, extraocular movements intact  Respiratory: Patient's speak in full sentences and does not appear short of breath  Cardiovascular: No lower extremity edema, non tender, no erythema  Gait MSK:  Back loss lordosis of the lumbar spine minorly.  Patient has multiple slipped ribs  on the right side from potential coughing.  Seems to be more sore than usual.  Osteopathic findings  C2 flexed rotated and side bent right C7 flexed rotated and side bent left T3 extended rotated and side bent right inhaled rib T7 extended rotated and side bent left L5 flexed rotated and side bent right Sacrum right on right     Assessment and Plan:  Degenerative disc disease, cervical Known arthritic changes.  Mild exacerbation with patient traveling.  Do think that this as well as a cough recently did cause more discomfort and pain.  Discussed with patient about icing regimen and home exercises, discussed with patient about continuing the different medications which patient likely has not needed on a regular basis.  Follow-up with me again in 6 to 8 weeks otherwise.  Toradol and Depo-Medrol  injection given today.    Nonallopathic problems  Decision today to treat with OMT was based on Physical Exam  After verbal consent patient was treated with HVLA, ME, FPR techniques in cervical, rib, thoracic, lumbar, and sacral  areas  Patient tolerated the procedure well with improvement in symptoms  Patient given exercises, stretches and lifestyle modifications  See medications in patient instructions if given  Patient will follow up in 4-8 weeks     The above documentation has been reviewed and is accurate and complete Peggi Yono M Luvia Orzechowski, DO         Note: This dictation was prepared with Dragon dictation along with smaller phrase technology. Any transcriptional errors that result from this process are unintentional.

## 2023-09-18 ENCOUNTER — Encounter: Payer: Self-pay | Admitting: Family Medicine

## 2023-09-18 ENCOUNTER — Ambulatory Visit: Admitting: Family Medicine

## 2023-09-18 VITALS — BP 110/72 | HR 77 | Ht 62.0 in | Wt 138.0 lb

## 2023-09-18 DIAGNOSIS — M9904 Segmental and somatic dysfunction of sacral region: Secondary | ICD-10-CM | POA: Diagnosis not present

## 2023-09-18 DIAGNOSIS — M9903 Segmental and somatic dysfunction of lumbar region: Secondary | ICD-10-CM | POA: Diagnosis not present

## 2023-09-18 DIAGNOSIS — M9908 Segmental and somatic dysfunction of rib cage: Secondary | ICD-10-CM | POA: Diagnosis not present

## 2023-09-18 DIAGNOSIS — M9902 Segmental and somatic dysfunction of thoracic region: Secondary | ICD-10-CM

## 2023-09-18 DIAGNOSIS — M5030 Other cervical disc degeneration, unspecified cervical region: Secondary | ICD-10-CM

## 2023-09-18 DIAGNOSIS — M9901 Segmental and somatic dysfunction of cervical region: Secondary | ICD-10-CM

## 2023-09-18 DIAGNOSIS — H029 Unspecified disorder of eyelid: Secondary | ICD-10-CM | POA: Diagnosis not present

## 2023-09-18 DIAGNOSIS — H2513 Age-related nuclear cataract, bilateral: Secondary | ICD-10-CM | POA: Diagnosis not present

## 2023-09-18 MED ORDER — KETOROLAC TROMETHAMINE 60 MG/2ML IM SOLN
60.0000 mg | Freq: Once | INTRAMUSCULAR | Status: AC
  Administered 2023-09-18: 60 mg via INTRAMUSCULAR

## 2023-09-18 MED ORDER — METHYLPREDNISOLONE ACETATE 80 MG/ML IJ SUSP
80.0000 mg | Freq: Once | INTRAMUSCULAR | Status: AC
  Administered 2023-09-18: 80 mg via INTRAMUSCULAR

## 2023-09-18 NOTE — Assessment & Plan Note (Addendum)
 Known arthritic changes.  Mild exacerbation with patient traveling.  Do think that this as well as a cough recently did cause more discomfort and pain.  Discussed with patient about icing regimen and home exercises, discussed with patient about continuing the different medications which patient likely has not needed on a regular basis.  Follow-up with me again in 6 to 8 weeks otherwise.  Toradol and Depo-Medrol  injection given today.

## 2023-09-18 NOTE — Patient Instructions (Signed)
 Good to see you! Cocktail injection Stay active Glad you had a great trip Start planning the next one See you again in 2 months

## 2023-11-06 ENCOUNTER — Encounter: Payer: Self-pay | Admitting: Family Medicine

## 2023-11-06 ENCOUNTER — Other Ambulatory Visit: Payer: Self-pay | Admitting: Family Medicine

## 2023-11-06 ENCOUNTER — Other Ambulatory Visit: Payer: Self-pay

## 2023-11-06 MED ORDER — TIZANIDINE HCL 4 MG PO TABS
4.0000 mg | ORAL_TABLET | Freq: Every day | ORAL | 1 refills | Status: DC
  Filled 2023-11-06: qty 30, 30d supply, fill #0
  Filled 2023-12-14: qty 30, 30d supply, fill #1

## 2023-11-12 NOTE — Progress Notes (Unsigned)
  [name] [address] [place] [address] Phone: [phone] Subjective:    I'm seeing this patient by the request  of:  Veronica Comer POUR, NP  CC:   YEP:Dlagzrupcz  Veronica [name] is a 68 y.o. female coming in with complaint of back and neck pain. OMT on 09/18/2023. Patient states   Medications patient has been prescribed: Zanaflex   Taking:         Reviewed prior external information including notes and imaging from previsou exam, outside providers and external EMR if available.   As well as notes that were available from care everywhere and other healthcare systems.  Past medical history, social, surgical and family history all reviewed in electronic medical record.  No pertanent information unless stated regarding to the chief complaint.   Past Medical History:  Diagnosis Date   COVID-19 virus infection 04/26/2020   Depression    Dry eyes    Hx of adenomatous polyp of colon + FHx CRCA 01/30/2023    No Known Allergies   Review of Systems:  No headache, visual changes, nausea, vomiting, diarrhea, constipation, dizziness, abdominal pain, skin rash, fevers, chills, night sweats, weight loss, swollen lymph nodes, body aches, joint swelling, chest pain, shortness of breath, mood changes. POSITIVE muscle aches  Objective  There were no vitals taken for this visit.   General: No apparent distress alert and oriented x3 mood and affect normal, dressed appropriately.  HEENT: Pupils equal, extraocular movements intact  Respiratory: Patient's speak in full sentences and does not appear short of breath  Cardiovascular: No lower extremity edema, non tender, no erythema  Gait MSK:  Back   Osteopathic findings  C2 flexed rotated and side bent right C6 flexed rotated and side bent left T3 extended rotated and side bent right inhaled rib T9 extended rotated and side bent left L2 flexed rotated and side bent right Sacrum right on  right       Assessment and Plan:  No problem-specific Assessment & Plan notes found for this encounter.    Nonallopathic problems  Decision today to treat with OMT was based on Physical Exam  After verbal consent patient was treated with HVLA, ME, FPR techniques in cervical, rib, thoracic, lumbar, and sacral  areas  Patient tolerated the procedure well with improvement in symptoms  Patient given exercises, stretches and lifestyle modifications  See medications in patient instructions if given  Patient will follow up in 4-8 weeks             Note: This dictation was prepared with Dragon dictation along with smaller phrase technology. Any transcriptional errors that result from this process are unintentional.

## 2023-11-12 NOTE — Progress Notes (Unsigned)
  [name] [address] [place] [address] Phone: [phone] Subjective:    I'm seeing this patient by the request  of:  Gretta Comer POUR, NP  CC:   YEP:Dlagzrupcz  Veronica [name] is a 68 y.o. female coming in with complaint of back and neck pain. OMT on 09/18/2023. Patient states   Medications patient has been prescribed: Zanaflex   Taking:         Reviewed prior external information including notes and imaging from previsou exam, outside providers and external EMR if available.   As well as notes that were available from care everywhere and other healthcare systems.  Past medical history, social, surgical and family history all reviewed in electronic medical record.  No pertanent information unless stated regarding to the chief complaint.   Past Medical History:  Diagnosis Date   COVID-19 virus infection 04/26/2020   Depression    Dry eyes    Hx of adenomatous polyp of colon + FHx CRCA 01/30/2023    No Known Allergies   Review of Systems:  No headache, visual changes, nausea, vomiting, diarrhea, constipation, dizziness, abdominal pain, skin rash, fevers, chills, night sweats, weight loss, swollen lymph nodes, body aches, joint swelling, chest pain, shortness of breath, mood changes. POSITIVE muscle aches  Objective  There were no vitals taken for this visit.   General: No apparent distress alert and oriented x3 mood and affect normal, dressed appropriately.  HEENT: Pupils equal, extraocular movements intact  Respiratory: Patient's speak in full sentences and does not appear short of breath  Cardiovascular: No lower extremity edema, non tender, no erythema  Gait MSK:  Back   Osteopathic findings  C2 flexed rotated and side bent right C6 flexed rotated and side bent left T3 extended rotated and side bent right inhaled rib T9 extended rotated and side bent left L2 flexed rotated and side bent right Sacrum right on  right       Assessment and Plan:  No problem-specific Assessment & Plan notes found for this encounter.    Nonallopathic problems  Decision today to treat with OMT was based on Physical Exam  After verbal consent patient was treated with HVLA, ME, FPR techniques in cervical, rib, thoracic, lumbar, and sacral  areas  Patient tolerated the procedure well with improvement in symptoms  Patient given exercises, stretches and lifestyle modifications  See medications in patient instructions if given  Patient will follow up in 4-8 weeks             Note: This dictation was prepared with Dragon dictation along with smaller phrase technology. Any transcriptional errors that result from this process are unintentional.

## 2023-11-17 ENCOUNTER — Other Ambulatory Visit: Payer: Self-pay | Admitting: Medical Genetics

## 2023-11-20 ENCOUNTER — Ambulatory Visit: Admitting: Family Medicine

## 2023-11-20 ENCOUNTER — Encounter: Payer: Self-pay | Admitting: Family Medicine

## 2023-11-20 VITALS — BP 112/70 | HR 78 | Ht 62.0 in | Wt 122.0 lb

## 2023-11-20 DIAGNOSIS — M5030 Other cervical disc degeneration, unspecified cervical region: Secondary | ICD-10-CM | POA: Diagnosis not present

## 2023-11-20 DIAGNOSIS — M9908 Segmental and somatic dysfunction of rib cage: Secondary | ICD-10-CM | POA: Diagnosis not present

## 2023-11-20 DIAGNOSIS — M9902 Segmental and somatic dysfunction of thoracic region: Secondary | ICD-10-CM | POA: Diagnosis not present

## 2023-11-20 DIAGNOSIS — M9901 Segmental and somatic dysfunction of cervical region: Secondary | ICD-10-CM

## 2023-11-20 NOTE — Assessment & Plan Note (Signed)
 DDD discussed HEP COntinue to stay active, no radiation at the moment.  Will continue to monitor.  Patient still has some limited range of motion of the neck noted.  Could potentially consider repeating epidurals but at the moment seems to be doing relatively well.  No medication changes.  Follow-up again in 6 to 8 weeks

## 2023-11-20 NOTE — Patient Instructions (Signed)
 Good to see you! See you again in 2 months Thank you for letting me lament about insurance Keep working the posture  Say hi to mom

## 2023-11-21 ENCOUNTER — Encounter: Admitting: Primary Care

## 2023-11-21 ENCOUNTER — Other Ambulatory Visit: Payer: Self-pay

## 2023-11-21 DIAGNOSIS — H01119 Allergic dermatitis of unspecified eye, unspecified eyelid: Secondary | ICD-10-CM | POA: Diagnosis not present

## 2023-11-21 DIAGNOSIS — D485 Neoplasm of uncertain behavior of skin: Secondary | ICD-10-CM | POA: Diagnosis not present

## 2023-11-21 DIAGNOSIS — H029 Unspecified disorder of eyelid: Secondary | ICD-10-CM | POA: Diagnosis not present

## 2023-11-21 MED ORDER — ERYTHROMYCIN 5 MG/GM OP OINT
TOPICAL_OINTMENT | Freq: Two times a day (BID) | OPHTHALMIC | 1 refills | Status: AC
  Filled 2023-11-21: qty 3.5, 7d supply, fill #0

## 2023-11-23 ENCOUNTER — Other Ambulatory Visit: Payer: Self-pay | Admitting: Primary Care

## 2023-11-23 DIAGNOSIS — F419 Anxiety disorder, unspecified: Secondary | ICD-10-CM

## 2023-11-24 ENCOUNTER — Other Ambulatory Visit: Payer: Self-pay

## 2023-11-24 MED ORDER — BUPROPION HCL ER (XL) 150 MG PO TB24
150.0000 mg | ORAL_TABLET | Freq: Every day | ORAL | 0 refills | Status: DC
  Filled 2023-11-24: qty 90, 90d supply, fill #0

## 2023-11-25 ENCOUNTER — Other Ambulatory Visit: Payer: Self-pay

## 2023-11-27 ENCOUNTER — Other Ambulatory Visit

## 2023-12-09 ENCOUNTER — Telehealth: Payer: Self-pay | Admitting: Family Medicine

## 2023-12-09 ENCOUNTER — Other Ambulatory Visit: Payer: Self-pay | Admitting: Family Medicine

## 2023-12-09 DIAGNOSIS — M25532 Pain in left wrist: Secondary | ICD-10-CM

## 2023-12-09 NOTE — Telephone Encounter (Signed)
 Patient called stating that she hit her hand on something on Saturday and it is still painful and puffy. She said that she would be in [facility] tomorrow and would like to know if Dr Claudene could order an xray for her?  Please advise.

## 2023-12-09 NOTE — Telephone Encounter (Signed)
 Spoke with Patient. She will come in at her leisure tomorrow for xray

## 2023-12-10 ENCOUNTER — Ambulatory Visit

## 2023-12-10 ENCOUNTER — Other Ambulatory Visit: Payer: Self-pay | Admitting: Family Medicine

## 2023-12-10 ENCOUNTER — Other Ambulatory Visit

## 2023-12-10 DIAGNOSIS — M25532 Pain in left wrist: Secondary | ICD-10-CM

## 2023-12-10 DIAGNOSIS — M79642 Pain in left hand: Secondary | ICD-10-CM | POA: Diagnosis not present

## 2023-12-10 DIAGNOSIS — S6992XA Unspecified injury of left wrist, hand and finger(s), initial encounter: Secondary | ICD-10-CM | POA: Diagnosis not present

## 2023-12-10 DIAGNOSIS — M19042 Primary osteoarthritis, left hand: Secondary | ICD-10-CM | POA: Diagnosis not present

## 2023-12-10 DIAGNOSIS — M1812 Unilateral primary osteoarthritis of first carpometacarpal joint, left hand: Secondary | ICD-10-CM | POA: Diagnosis not present

## 2023-12-10 DIAGNOSIS — Z006 Encounter for examination for normal comparison and control in clinical research program: Secondary | ICD-10-CM

## 2023-12-11 ENCOUNTER — Encounter: Payer: Self-pay | Admitting: Primary Care

## 2023-12-11 ENCOUNTER — Ambulatory Visit (INDEPENDENT_AMBULATORY_CARE_PROVIDER_SITE_OTHER): Admitting: Primary Care

## 2023-12-11 VITALS — BP 136/80 | HR 70 | Temp 97.4°F | Ht 62.0 in | Wt 123.0 lb

## 2023-12-11 DIAGNOSIS — M159 Polyosteoarthritis, unspecified: Secondary | ICD-10-CM | POA: Diagnosis not present

## 2023-12-11 DIAGNOSIS — G43701 Chronic migraine without aura, not intractable, with status migrainosus: Secondary | ICD-10-CM | POA: Diagnosis not present

## 2023-12-11 DIAGNOSIS — F32A Depression, unspecified: Secondary | ICD-10-CM | POA: Diagnosis not present

## 2023-12-11 DIAGNOSIS — Z0000 Encounter for general adult medical examination without abnormal findings: Secondary | ICD-10-CM

## 2023-12-11 DIAGNOSIS — Z23 Encounter for immunization: Secondary | ICD-10-CM | POA: Diagnosis not present

## 2023-12-11 DIAGNOSIS — F419 Anxiety disorder, unspecified: Secondary | ICD-10-CM

## 2023-12-11 DIAGNOSIS — E785 Hyperlipidemia, unspecified: Secondary | ICD-10-CM | POA: Diagnosis not present

## 2023-12-11 NOTE — Assessment & Plan Note (Signed)
 Controlled.  Continue sumatriptan -naproxen  85-500 mg daily.

## 2023-12-11 NOTE — Assessment & Plan Note (Signed)
 Stable.  Continue Celebrex  100 mg BID PRN, gabapentin  300 mg PRN, tizanidine  4 mg PRN.

## 2023-12-11 NOTE — Patient Instructions (Signed)
 Stop by the lab prior to leaving today. I will notify you of your results once received.   It was a pleasure to see you today!

## 2023-12-11 NOTE — Assessment & Plan Note (Signed)
 Declines pneumonia vaccine. Influenza vaccine provided today. Discussed Shingrix vaccines Mammogram and bone density scan UTD Colonoscopy UTD, due 2029  Discussed the importance of a healthy diet and regular exercise in order for weight loss, and to reduce the risk of further co-morbidity.  Exam stable. Labs pending.  Follow up in 1 year for repeat physical.

## 2023-12-11 NOTE — Progress Notes (Signed)
 Subjective:    Patient ID: Stephane August, female    DOB: Oct 18, 1955, 68 y.o.   MRN: [id number]  Yesica Kemler is a very pleasant 68 y.o. female who presents today for complete physical and follow up of chronic conditions.  Immunizations: -Tetanus: Completed in 2019 -Influenza: Influenza vaccine provided today.  -Shingles: Never completed -Pneumonia: Never completed, declines   Diet: Fair diet.  Exercise: Regular exercise daily.  Eye exam: Completes annually  Dental exam: Completes semi-annually    Mammogram: Completed in March 2025 Bone Density Scan: Completed in March 2025  Colonoscopy: Completed in 2024, due 2029  BP Readings from Last 3 Encounters:  12/11/23 136/80  11/20/23 112/70  09/18/23 110/72     Review of Systems  Constitutional:  Negative for unexpected weight change.  HENT:  Negative for rhinorrhea.   Respiratory:  Negative for cough and shortness of breath.   Cardiovascular:  Negative for chest pain.  Gastrointestinal:  Negative for constipation and diarrhea.  Genitourinary:  Negative for difficulty urinating.  Musculoskeletal:  Positive for arthralgias. Negative for myalgias.  Skin:  Negative for rash.  Allergic/Immunologic: Negative for environmental allergies.  Neurological:  Negative for dizziness and headaches.  Psychiatric/Behavioral:  The patient is not nervous/anxious.          Past Medical History:  Diagnosis Date   COVID-19 virus infection 04/26/2020   Depression    Dry eyes    Hx of adenomatous polyp of colon + FHx CRCA 01/30/2023    Social History   Socioeconomic History   Marital status: Married    Spouse name: Not on file   Number of children: Not on file   Years of education: Not on file   Highest education level: Bachelor's degree (e.g., BA, AB, BS)  Occupational History   Not on file  Tobacco Use   Smoking status: Former    Current packs/day: 0.00    Average packs/day: 0.5 packs/day for 4.0 years (2.0 ttl pk-yrs)    Types:  Cigarettes    Quit date: 06/11/1988    Years since quitting: 35.5   Smokeless tobacco: Never  Vaping Use   Vaping status: Never Used  Substance and Sexual Activity   Alcohol use: Yes    Alcohol/week: 6.0 standard drinks of alcohol    Types: 6 Glasses of wine per week   Drug use: No   Sexual activity: Yes    Birth control/protection: None  Other Topics Concern   Not on file  Social History Narrative   Married.   No children.   Works for [employer].   Enjoys showing and riding horses.    Social Drivers of Corporate investment banker Strain: Low Risk  (12/08/2023)   Overall Financial Resource Strain (CARDIA)    Difficulty of Paying Living Expenses: Not hard at all  Food Insecurity: No Food Insecurity (12/08/2023)   Hunger Vital Sign    Worried About Running Out of Food in the Last Year: Never true    Ran Out of Food in the Last Year: Never true  Transportation Needs: No Transportation Needs (12/08/2023)   PRAPARE - Administrator, Civil Service (Medical): No    Lack of Transportation (Non-Medical): No  Physical Activity: Sufficiently Active (12/08/2023)   Exercise Vital Sign    Days of Exercise per Week: 6 days    Minutes of Exercise per Session: 30 min  Stress: No Stress Concern Present (12/08/2023)   [employer] of Occupational Health -  Occupational Stress Questionnaire    Feeling of Stress: Only a little  Social Connections: Socially Integrated (12/08/2023)   Social Connection and Isolation Panel    Frequency of Communication with Friends and Family: More than three times a week    Frequency of Social Gatherings with Friends and Family: Once a week    Attends Religious Services: More than 4 times per year    Active Member of [employer] or Organizations: Yes    Attends Banker Meetings: 1 to 4 times per year    Marital Status: Married  Catering manager Violence: Not on file    Past Surgical History:  Procedure Laterality Date   CLOSED  REDUCTION HAND FRACTURE  04/02/2000   left   COLONOSCOPY     EYE SURGERY  2000   Lasix   FRACTURE SURGERY     Hand   LASIK      Family History  Problem Relation Age of Onset   Hypertension Mother    Heart disease Mother    Rectal cancer Mother    COPD Father    Stroke Father    Colon cancer Maternal Grandmother    Esophageal cancer Neg Hx    Stomach cancer Neg Hx    Colon polyps Neg Hx    Breast cancer Neg Hx     No Known Allergies  Current Outpatient Medications on File Prior to Visit  Medication Sig Dispense Refill   acetaminophen (TYLENOL) 500 MG tablet Take 500 mg by mouth every 6 (six) hours as needed.     buPROPion  (WELLBUTRIN  XL) 150 MG 24 hr tablet Take 1 tablet (150 mg total) by mouth daily for anxiety and depression. 90 tablet 0   celecoxib  (CELEBREX ) 100 MG capsule Take 1 capsule (100 mg total) by mouth 2 (two) times daily as needed for moderate pain (pain score 4-6). 180 capsule 0   cholecalciferol (VITAMIN D3) 25 MCG (1000 UNIT) tablet Take 2,000 Units by mouth daily.     Diclofenac  Sodium (PENNSAID ) 2 % SOLN Place 1 application onto the skin 2 (two) times daily. 1 Bottle 3   gabapentin  (NEURONTIN ) 100 MG capsule TAKE 3 CAPSULES AT BEDTIME 180 capsule 0   SUMAtriptan -naproxen  (TREXIMET ) 85-500 MG tablet Take 1 tablet by mouth at migraine onset. May repeat in 2 hours if migraine persists, do not exceed 2 tablets in 24 hours. 10 tablet 0   triamcinolone  cream (KENALOG ) 0.1 % Apply topically as needed.     tiZANidine  (ZANAFLEX ) 4 MG tablet Take 1 tablet (4 mg total) by mouth at bedtime. (Patient not taking: Reported on 12/11/2023) 30 tablet 1   No current facility-administered medications on file prior to visit.    BP 136/80   Pulse 70   Temp (!) 97.4 F (36.3 C) (Temporal)   Ht 5' 2 (1.575 m)   Wt 123 lb (55.8 kg)   SpO2 99%   BMI 22.50 kg/m  Objective:   Physical Exam HENT:     Right Ear: Tympanic membrane and ear canal normal.     Left Ear: Tympanic  membrane and ear canal normal.  Eyes:     Pupils: Pupils are equal, round, and reactive to light.  Cardiovascular:     Rate and Rhythm: Normal rate and regular rhythm.  Pulmonary:     Effort: Pulmonary effort is normal.     Breath sounds: Normal breath sounds.  Abdominal:     General: Bowel sounds are normal.     Palpations: Abdomen  is soft.     Tenderness: There is no abdominal tenderness.  Musculoskeletal:        General: Normal range of motion.     Cervical back: Neck supple.  Skin:    General: Skin is warm and dry.  Neurological:     Mental Status: She is alert and oriented to person, place, and time.     Cranial Nerves: No cranial nerve deficit.     Deep Tendon Reflexes:     Reflex Scores:      Patellar reflexes are 2+ on the right side and 2+ on the left side. Psychiatric:        Mood and Affect: Mood normal.     Physical Exam        Assessment & Plan:  Preventative health care Assessment & Plan: Declines pneumonia vaccine. Influenza vaccine provided today. Discussed Shingrix vaccines Mammogram and bone density scan UTD Colonoscopy UTD, due 2029  Discussed the importance of a healthy diet and regular exercise in order for weight loss, and to reduce the risk of further co-morbidity.  Exam stable. Labs pending.  Follow up in 1 year for repeat physical.    Encounter for immunization -     Flu vaccine HIGH DOSE PF(Fluzone Trivalent)  Hyperlipidemia, unspecified hyperlipidemia type -     Lipid panel -     Comprehensive metabolic panel with GFR  Osteoarthritis of multiple joints, unspecified osteoarthritis type Assessment & Plan: Stable.  Continue Celebrex  100 mg BID PRN, gabapentin  300 mg PRN, tizanidine  4 mg PRN.   Anxiety and depression Assessment & Plan: Controlled.  Continue bupropion  XL 150 mg daily   Chronic migraine without aura with status migrainosus, not intractable Assessment & Plan: Controlled.  Continue sumatriptan -naproxen   85-500 mg daily.     Assessment and Plan Assessment & Plan         Comer MARLA Gaskins, NP    History of Present Illness

## 2023-12-11 NOTE — Assessment & Plan Note (Signed)
Controlled.  Continue bupropion XL 150 mg daily.  

## 2023-12-12 ENCOUNTER — Encounter: Payer: Self-pay | Admitting: Family Medicine

## 2023-12-12 ENCOUNTER — Ambulatory Visit: Payer: Self-pay | Admitting: Primary Care

## 2023-12-12 ENCOUNTER — Ambulatory Visit: Payer: Self-pay | Admitting: Family Medicine

## 2023-12-12 DIAGNOSIS — E875 Hyperkalemia: Secondary | ICD-10-CM

## 2023-12-12 LAB — COMPREHENSIVE METABOLIC PANEL WITH GFR
ALT: 15 U/L (ref 0–35)
AST: 22 U/L (ref 0–37)
Albumin: 4.3 g/dL (ref 3.5–5.2)
Alkaline Phosphatase: 47 U/L (ref 39–117)
BUN: 12 mg/dL (ref 6–23)
CO2: 29 meq/L (ref 19–32)
Calcium: 9.9 mg/dL (ref 8.4–10.5)
Chloride: 103 meq/L (ref 96–112)
Creatinine, Ser: 0.88 mg/dL (ref 0.40–1.20)
GFR: 68.59 mL/min (ref >60.00)
Glucose, Bld: 71 mg/dL (ref 70–99)
Potassium: 5.2 meq/L — ABNORMAL HIGH (ref 3.5–5.1)
Sodium: 139 meq/L (ref 135–145)
Total Bilirubin: 0.7 mg/dL (ref 0.2–1.2)
Total Protein: 6.6 g/dL (ref 6.0–8.3)

## 2023-12-12 LAB — LIPID PANEL
Cholesterol: 228 mg/dL — ABNORMAL HIGH (ref 0–200)
HDL: 92.2 mg/dL (ref >39.00)
LDL Cholesterol: 121 mg/dL — ABNORMAL HIGH (ref 0–99)
NonHDL: 135.33
Total CHOL/HDL Ratio: 2
Triglycerides: 70 mg/dL (ref 0.0–149.0)
VLDL: 14 mg/dL (ref 0.0–40.0)

## 2023-12-14 ENCOUNTER — Other Ambulatory Visit: Payer: Self-pay | Admitting: Primary Care

## 2023-12-14 ENCOUNTER — Other Ambulatory Visit: Payer: Self-pay

## 2023-12-14 DIAGNOSIS — M159 Polyosteoarthritis, unspecified: Secondary | ICD-10-CM

## 2023-12-14 DIAGNOSIS — F32A Depression, unspecified: Secondary | ICD-10-CM

## 2023-12-15 ENCOUNTER — Other Ambulatory Visit: Payer: Self-pay

## 2023-12-15 MED FILL — Celecoxib Cap 100 MG: ORAL | 90 days supply | Qty: 180 | Fill #0 | Status: AC

## 2023-12-16 ENCOUNTER — Other Ambulatory Visit: Payer: Self-pay

## 2023-12-20 LAB — GENECONNECT MOLECULAR SCREEN: Genetic Analysis Overall Interpretation: NEGATIVE

## 2024-01-01 ENCOUNTER — Ambulatory Visit: Admitting: Family Medicine

## 2024-01-26 ENCOUNTER — Other Ambulatory Visit: Payer: Self-pay | Admitting: Primary Care

## 2024-01-26 DIAGNOSIS — M159 Polyosteoarthritis, unspecified: Secondary | ICD-10-CM

## 2024-01-27 ENCOUNTER — Other Ambulatory Visit: Payer: Self-pay

## 2024-01-28 NOTE — Progress Notes (Unsigned)
 [name] [address] [place] [address] Phone: [phone] Subjective:    I'm seeing this patient by the request  of:  Gretta Comer POUR, NP  CC: Back and neck pain follow-up  YEP:Dlagzrupcz  [name] is a 68 y.o. female coming in with complaint of back and neck pain. OMT 11/20/2023.  Patient states neck and shoulder are stiff and sore.   Medications patient has been prescribed: Zanaflex   Taking:  X-rays of the hand showed moderate CMC arthritis and some mild hand arthritis.       Reviewed prior external information including notes and imaging from previsou exam, outside providers and external EMR if available.   As well as notes that were available from care everywhere and other healthcare systems.  Past medical history, social, surgical and family history all reviewed in electronic medical record.  No pertanent information unless stated regarding to the chief complaint.   Past Medical History:  Diagnosis Date   COVID-19 virus infection 04/26/2020   Depression    Dry eyes    Hx of adenomatous polyp of colon + FHx CRCA 01/30/2023    No Known Allergies   Review of Systems:  No headache, visual changes, nausea, vomiting, diarrhea, constipation, dizziness, abdominal pain, skin rash, fevers, chills, night sweats, weight loss, swollen lymph nodes, body aches, joint swelling, chest pain, shortness of breath, mood changes. POSITIVE muscle aches  Objective  Blood pressure 112/82, pulse 78, height 5' 2 (1.575 m), weight 120 lb (54.4 kg), SpO2 98%.   General: No apparent distress alert and oriented x3 mood and affect normal, dressed appropriately.  HEENT: Pupils equal, extraocular movements intact  Respiratory: Patient's speak in full sentences and does not appear short of breath  Cardiovascular: No lower extremity edema, non tender, no erythema  Gait MSK:  Back neck exam does have some loss of lordosis noted.  Some tenderness to  palpation in the paraspinal musculature.  Osteopathic findings  C2 flexed rotated and side bent right C3 flexed rotated and side bent left C7 flexed rotated and side bent right T3 extended rotated and side bent right inhaled rib T9 extended rotated and side bent left L2 flexed rotated and side bent right L3 flexed rotated and side bent left Sacrum right on right       Assessment and Plan:  Degenerative disc disease, cervical Known degenerative disc disease.  Has done relatively well though with conservative therapy.  Has responded okay to an epidural as well and last 1 was in November.  Discussed with patient about icing regimen and home exercises, discussed which activities to do and which ones to avoid.  Increase activity slowly.  Discussed icing regimen.  Follow-up again in 6 to 12 weeks    Nonallopathic problems  Decision today to treat with OMT was based on Physical Exam  After verbal consent patient was treated with HVLA, ME, FPR techniques in cervical, rib, thoracic, lumbar, and sacral  areas  Patient tolerated the procedure well with improvement in symptoms  Patient given exercises, stretches and lifestyle modifications  See medications in patient instructions if given  Patient will follow up in 4-8 weeks    The above documentation has been reviewed and is accurate and complete Veronica Molenda M Kyron Schlitt, DO          Note: This dictation was prepared with Dragon dictation along with smaller phrase technology. Any transcriptional errors that result from this process are unintentional.

## 2024-01-29 ENCOUNTER — Ambulatory Visit: Admitting: Family Medicine

## 2024-01-29 ENCOUNTER — Encounter: Payer: Self-pay | Admitting: Family Medicine

## 2024-01-29 VITALS — BP 112/82 | HR 78 | Ht 62.0 in | Wt 120.0 lb

## 2024-01-29 DIAGNOSIS — M9908 Segmental and somatic dysfunction of rib cage: Secondary | ICD-10-CM | POA: Diagnosis not present

## 2024-01-29 DIAGNOSIS — M9902 Segmental and somatic dysfunction of thoracic region: Secondary | ICD-10-CM | POA: Diagnosis not present

## 2024-01-29 DIAGNOSIS — M5030 Other cervical disc degeneration, unspecified cervical region: Secondary | ICD-10-CM

## 2024-01-29 DIAGNOSIS — M9903 Segmental and somatic dysfunction of lumbar region: Secondary | ICD-10-CM | POA: Diagnosis not present

## 2024-01-29 DIAGNOSIS — M9904 Segmental and somatic dysfunction of sacral region: Secondary | ICD-10-CM | POA: Diagnosis not present

## 2024-01-29 DIAGNOSIS — M9901 Segmental and somatic dysfunction of cervical region: Secondary | ICD-10-CM

## 2024-01-29 NOTE — Patient Instructions (Addendum)
 Good to see you   Enjoy those horses   2 month follow up

## 2024-01-29 NOTE — Assessment & Plan Note (Signed)
 Known degenerative disc disease.  Has done relatively well though with conservative therapy.  Has responded okay to an epidural as well and last 1 was in November.  Discussed with patient about icing regimen and home exercises, discussed which activities to do and which ones to avoid.  Increase activity slowly.  Discussed icing regimen.  Follow-up again in 6 to 12 weeks

## 2024-02-05 DIAGNOSIS — D225 Melanocytic nevi of trunk: Secondary | ICD-10-CM | POA: Diagnosis not present

## 2024-02-05 DIAGNOSIS — L905 Scar conditions and fibrosis of skin: Secondary | ICD-10-CM | POA: Diagnosis not present

## 2024-02-11 ENCOUNTER — Other Ambulatory Visit: Payer: Self-pay | Admitting: Family Medicine

## 2024-02-11 ENCOUNTER — Other Ambulatory Visit: Payer: Self-pay | Admitting: Primary Care

## 2024-02-11 DIAGNOSIS — M159 Polyosteoarthritis, unspecified: Secondary | ICD-10-CM

## 2024-02-11 DIAGNOSIS — F32A Depression, unspecified: Secondary | ICD-10-CM

## 2024-02-12 ENCOUNTER — Other Ambulatory Visit: Payer: Self-pay

## 2024-02-12 MED ORDER — CELECOXIB 100 MG PO CAPS
100.0000 mg | ORAL_CAPSULE | Freq: Two times a day (BID) | ORAL | 0 refills | Status: AC | PRN
  Filled 2024-02-12 (×2): qty 180, 90d supply, fill #0

## 2024-02-12 MED ORDER — BUPROPION HCL ER (XL) 150 MG PO TB24
150.0000 mg | ORAL_TABLET | Freq: Every day | ORAL | 2 refills | Status: AC
  Filled 2024-02-12: qty 90, 90d supply, fill #0
  Filled 2024-03-23: qty 90, 90d supply, fill #1

## 2024-02-13 ENCOUNTER — Other Ambulatory Visit: Payer: Self-pay

## 2024-02-13 MED ORDER — TIZANIDINE HCL 4 MG PO TABS
4.0000 mg | ORAL_TABLET | Freq: Every day | ORAL | 1 refills | Status: AC
  Filled 2024-02-13: qty 30, 30d supply, fill #0
  Filled 2024-03-23: qty 30, 30d supply, fill #1

## 2024-02-19 ENCOUNTER — Other Ambulatory Visit: Payer: Self-pay

## 2024-02-19 MED ORDER — SHINGRIX 50 MCG/0.5ML IM SUSR
0.5000 mL | INTRAMUSCULAR | 1 refills | Status: AC
  Filled 2024-02-19: qty 0.5, 1d supply, fill #0

## 2024-03-24 ENCOUNTER — Other Ambulatory Visit: Payer: Self-pay

## 2024-04-01 NOTE — Progress Notes (Signed)
 " [name] [address] [place] [address] Phone: [phone] Subjective:    I'm seeing this patient by the request  of:  Gretta Comer POUR, NP  CC: Neck pain follow-up  YEP:Dlagzrupcz  Veronica [name] is a 68 y.o. female coming in with complaint of back and neck pain. OMT 01/29/2024. Patient states that she is having a tight neck and shoulders.   Medications patient has been prescribed: Zanaflex   Taking:         Reviewed prior external information including notes and imaging from previsou exam, outside providers and external EMR if available.   As well as notes that were available from care everywhere and other healthcare systems.  Past medical history, social, surgical and family history all reviewed in electronic medical record.  No pertanent information unless stated regarding to the chief complaint.   Past Medical History:  Diagnosis Date   COVID-19 virus infection 04/26/2020   Depression    Dry eyes    Hx of adenomatous polyp of colon + FHx CRCA 01/30/2023    Allergies[1]   Review of Systems:  No headache, visual changes, nausea, vomiting, diarrhea, constipation, dizziness, abdominal pain, skin rash, fevers, chills, night sweats, weight loss, swollen lymph nodes, body aches, joint swelling, chest pain, shortness of breath, mood changes. POSITIVE muscle aches  Objective  Blood pressure 110/78, pulse (!) 54, height 5' 2 (1.575 m), weight 125 lb (56.7 kg), SpO2 98%.   General: No apparent distress alert and oriented x3 mood and affect normal, dressed appropriately.  HEENT: Pupils equal, extraocular movements intact  Respiratory: Patient's speak in full sentences and does not appear short of breath  Cardiovascular: No lower extremity edema, non tender, no erythema  Gait MSK:  Back relatively normal except patient's neck does have some loss of lordosis noted.  Some tenderness to palpation in the paraspinal musculature.  Negative  Spurling's noted today.  Osteopathic findings  C2 flexed rotated and side bent right C6 flexed rotated and side bent left T3 extended rotated and side bent right inhaled rib T9 extended rotated and side bent left L2 flexed rotated and side bent right Sacrum right on right  After verbal consent patient was prepped with alcohol swab and with a 25-gauge half inch needle injected into 3 distinct trigger points in the trapezius, rhomboid and latissimus dorsi of the left shoulder region.  Total of 3 cc 0.5% Marcaine and 1 cc of Kenalog  40 mg/mL used.  Minimal blood loss.  Band-Aid placed.  Postinjection instruction given.     Assessment and Plan:  Degenerative disc disease, cervical Known degenerative disc disease.  Could be a candidate for another epidural, will start with formal physical therapy.  Trigger points of the left shoulder also injected again.  Hopeful that this will make a difference.  Discussed with patient about gabapentin  on a more regular basis, follow-up again in 6 to 12 weeks.    Nonallopathic problems  Decision today to treat with OMT was based on Physical Exam  After verbal consent patient was treated with HVLA, ME, FPR techniques in cervical, rib, thoracic, lumbar, and sacral  areas  Patient tolerated the procedure well with improvement in symptoms  Patient given exercises, stretches and lifestyle modifications  See medications in patient instructions if given  Patient will follow up in 4-8 weeks    The above documentation has been reviewed and is accurate and complete Veronica Akkerman M Davari Lopes, DO  Note: This dictation was prepared with Dragon dictation along with smaller phrase technology. Any transcriptional errors that result from this process are unintentional.            [1] No Known Allergies  "

## 2024-04-07 ENCOUNTER — Encounter: Payer: Self-pay | Admitting: Family Medicine

## 2024-04-07 ENCOUNTER — Ambulatory Visit: Admitting: Family Medicine

## 2024-04-07 VITALS — BP 110/78 | HR 54 | Ht 62.0 in | Wt 125.0 lb

## 2024-04-07 DIAGNOSIS — M9901 Segmental and somatic dysfunction of cervical region: Secondary | ICD-10-CM | POA: Diagnosis not present

## 2024-04-07 DIAGNOSIS — M9908 Segmental and somatic dysfunction of rib cage: Secondary | ICD-10-CM | POA: Diagnosis not present

## 2024-04-07 DIAGNOSIS — M9904 Segmental and somatic dysfunction of sacral region: Secondary | ICD-10-CM | POA: Diagnosis not present

## 2024-04-07 DIAGNOSIS — M9902 Segmental and somatic dysfunction of thoracic region: Secondary | ICD-10-CM

## 2024-04-07 DIAGNOSIS — M9903 Segmental and somatic dysfunction of lumbar region: Secondary | ICD-10-CM

## 2024-04-07 DIAGNOSIS — M25512 Pain in left shoulder: Secondary | ICD-10-CM | POA: Diagnosis not present

## 2024-04-07 DIAGNOSIS — M5030 Other cervical disc degeneration, unspecified cervical region: Secondary | ICD-10-CM

## 2024-04-07 NOTE — Patient Instructions (Addendum)
 Trigger point injections See me again in 2 months Referral to PT

## 2024-04-07 NOTE — Assessment & Plan Note (Signed)
 Known degenerative disc disease.  Could be a candidate for another epidural, will start with formal physical therapy.  Trigger points of the left shoulder also injected again.  Hopeful that this will make a difference.  Discussed with patient about gabapentin  on a more regular basis, follow-up again in 6 to 12 weeks.

## 2024-06-16 ENCOUNTER — Ambulatory Visit: Admitting: Family Medicine
